# Patient Record
Sex: Female | Born: 1935 | Race: White | Hispanic: No | Smoking: Never smoker
Health system: Southern US, Community
[De-identification: ages and names within clinical notes are randomized; demographics above are authoritative.]

## PROBLEM LIST (undated history)

## (undated) DIAGNOSIS — R569 Unspecified convulsions: Secondary | ICD-10-CM

## (undated) DIAGNOSIS — K219 Gastro-esophageal reflux disease without esophagitis: Secondary | ICD-10-CM

## (undated) DIAGNOSIS — F0390 Unspecified dementia without behavioral disturbance: Secondary | ICD-10-CM

## (undated) DIAGNOSIS — G459 Transient cerebral ischemic attack, unspecified: Secondary | ICD-10-CM

## (undated) DIAGNOSIS — F1010 Alcohol abuse, uncomplicated: Secondary | ICD-10-CM

## (undated) DIAGNOSIS — E876 Hypokalemia: Secondary | ICD-10-CM

## (undated) DIAGNOSIS — E7800 Pure hypercholesterolemia, unspecified: Secondary | ICD-10-CM

## (undated) DIAGNOSIS — C3490 Malignant neoplasm of unspecified part of unspecified bronchus or lung: Secondary | ICD-10-CM

## (undated) DIAGNOSIS — I4891 Unspecified atrial fibrillation: Secondary | ICD-10-CM

## (undated) DIAGNOSIS — F29 Unspecified psychosis not due to a substance or known physiological condition: Secondary | ICD-10-CM

## (undated) DIAGNOSIS — R1310 Dysphagia, unspecified: Secondary | ICD-10-CM

## (undated) DIAGNOSIS — I509 Heart failure, unspecified: Secondary | ICD-10-CM

## (undated) DIAGNOSIS — E079 Disorder of thyroid, unspecified: Secondary | ICD-10-CM

## (undated) DIAGNOSIS — I10 Essential (primary) hypertension: Secondary | ICD-10-CM

## (undated) HISTORY — PX: REPLACEMENT TOTAL KNEE: SUR1224

## (undated) HISTORY — DX: Unspecified psychosis not due to a substance or known physiological condition: F29

## (undated) HISTORY — DX: Alcohol abuse, uncomplicated: F10.10

## (undated) HISTORY — DX: Unspecified convulsions: R56.9

## (undated) HISTORY — PX: LOBECTOMY: SHX5089

## (undated) HISTORY — PX: OTHER SURGICAL HISTORY: SHX169

## (undated) HISTORY — DX: Heart failure, unspecified: I50.9

## (undated) HISTORY — DX: Unspecified dementia without behavioral disturbance: F03.90

---

## 2013-01-14 ENCOUNTER — Emergency Department (HOSPITAL_COMMUNITY): Payer: Medicare (Managed Care)

## 2013-01-14 ENCOUNTER — Emergency Department (HOSPITAL_COMMUNITY)
Admission: EM | Admit: 2013-01-14 | Discharge: 2013-01-14 | Disposition: A | Discharge status: Home / Self Care | Payer: Medicare (Managed Care) | Attending: Emergency Medicine | Admitting: Emergency Medicine

## 2013-01-14 ENCOUNTER — Encounter (HOSPITAL_COMMUNITY): Payer: Self-pay | Admitting: Emergency Medicine

## 2013-01-14 DIAGNOSIS — M79609 Pain in unspecified limb: Secondary | ICD-10-CM

## 2013-01-14 DIAGNOSIS — W010XXA Fall on same level from slipping, tripping and stumbling without subsequent striking against object, initial encounter: Secondary | ICD-10-CM | POA: Diagnosis not present

## 2013-01-14 DIAGNOSIS — Z7982 Long term (current) use of aspirin: Secondary | ICD-10-CM | POA: Diagnosis not present

## 2013-01-14 DIAGNOSIS — Z85118 Personal history of other malignant neoplasm of bronchus and lung: Secondary | ICD-10-CM | POA: Insufficient documentation

## 2013-01-14 DIAGNOSIS — I4891 Unspecified atrial fibrillation: Secondary | ICD-10-CM | POA: Diagnosis not present

## 2013-01-14 DIAGNOSIS — K219 Gastro-esophageal reflux disease without esophagitis: Secondary | ICD-10-CM | POA: Diagnosis not present

## 2013-01-14 DIAGNOSIS — E079 Disorder of thyroid, unspecified: Secondary | ICD-10-CM | POA: Diagnosis not present

## 2013-01-14 DIAGNOSIS — Z8673 Personal history of transient ischemic attack (TIA), and cerebral infarction without residual deficits: Secondary | ICD-10-CM | POA: Diagnosis not present

## 2013-01-14 DIAGNOSIS — Y9229 Other specified public building as the place of occurrence of the external cause: Secondary | ICD-10-CM | POA: Insufficient documentation

## 2013-01-14 DIAGNOSIS — Z79899 Other long term (current) drug therapy: Secondary | ICD-10-CM | POA: Insufficient documentation

## 2013-01-14 DIAGNOSIS — Z7902 Long term (current) use of antithrombotics/antiplatelets: Secondary | ICD-10-CM | POA: Insufficient documentation

## 2013-01-14 DIAGNOSIS — S8253XA Displaced fracture of medial malleolus of unspecified tibia, initial encounter for closed fracture: Secondary | ICD-10-CM | POA: Insufficient documentation

## 2013-01-14 DIAGNOSIS — S8990XA Unspecified injury of unspecified lower leg, initial encounter: Secondary | ICD-10-CM | POA: Diagnosis present

## 2013-01-14 DIAGNOSIS — S8252XA Displaced fracture of medial malleolus of left tibia, initial encounter for closed fracture: Secondary | ICD-10-CM

## 2013-01-14 DIAGNOSIS — Y9389 Activity, other specified: Secondary | ICD-10-CM | POA: Insufficient documentation

## 2013-01-14 DIAGNOSIS — E7800 Pure hypercholesterolemia, unspecified: Secondary | ICD-10-CM | POA: Diagnosis not present

## 2013-01-14 DIAGNOSIS — I10 Essential (primary) hypertension: Secondary | ICD-10-CM | POA: Diagnosis not present

## 2013-01-14 DIAGNOSIS — M7989 Other specified soft tissue disorders: Secondary | ICD-10-CM

## 2013-01-14 HISTORY — DX: Essential (primary) hypertension: I10

## 2013-01-14 HISTORY — DX: Transient cerebral ischemic attack, unspecified: G45.9

## 2013-01-14 HISTORY — DX: Unspecified atrial fibrillation: I48.91

## 2013-01-14 HISTORY — DX: Disorder of thyroid, unspecified: E07.9

## 2013-01-14 HISTORY — DX: Gastro-esophageal reflux disease without esophagitis: K21.9

## 2013-01-14 HISTORY — DX: Malignant neoplasm of unspecified part of unspecified bronchus or lung: C34.90

## 2013-01-14 HISTORY — DX: Pure hypercholesterolemia, unspecified: E78.00

## 2013-01-14 LAB — CBC WITH DIFFERENTIAL/PLATELET
Basophils Absolute: 0 K/uL (ref 0.0–0.1)
Basophils Relative: 1 % (ref 0–1)
Eosinophils Absolute: 0.2 K/uL (ref 0.0–0.7)
Eosinophils Relative: 3 % (ref 0–5)
HCT: 34.9 % — ABNORMAL LOW (ref 36.0–46.0)
Hemoglobin: 12.2 g/dL (ref 12.0–15.0)
Lymphocytes Relative: 23 % (ref 12–46)
Lymphs Abs: 1.4 K/uL (ref 0.7–4.0)
MCH: 33.7 pg (ref 26.0–34.0)
MCHC: 35 g/dL (ref 30.0–36.0)
MCV: 96.4 fL (ref 78.0–100.0)
Monocytes Absolute: 0.8 K/uL (ref 0.1–1.0)
Monocytes Relative: 13 % — ABNORMAL HIGH (ref 3–12)
Neutro Abs: 3.6 K/uL (ref 1.7–7.7)
Neutrophils Relative %: 61 % (ref 43–77)
Platelets: 149 K/uL — ABNORMAL LOW (ref 150–400)
RBC: 3.62 MIL/uL — ABNORMAL LOW (ref 3.87–5.11)
RDW: 14.6 % (ref 11.5–15.5)
WBC: 5.9 K/uL (ref 4.0–10.5)

## 2013-01-14 LAB — BASIC METABOLIC PANEL
BUN: 13 mg/dL (ref 6–23)
CO2: 26 mEq/L (ref 19–32)
Calcium: 9.2 mg/dL (ref 8.4–10.5)
Chloride: 103 mEq/L (ref 96–112)
Creatinine, Ser: 0.7 mg/dL (ref 0.50–1.10)
GFR calc Af Amer: 87 mL/min — ABNORMAL LOW (ref >90)

## 2013-01-14 LAB — D-DIMER, QUANTITATIVE: D-Dimer, Quant: 1.35 ug{FEU}/mL — ABNORMAL HIGH (ref 0.00–0.48)

## 2013-01-14 NOTE — ED Notes (Signed)
Dr Wilkie Aye notified of discharge BP and that pt has not had metoprolol dose today.  No chest pain or dizziness.  Ok per MD to go home and take medication.

## 2013-01-14 NOTE — ED Provider Notes (Signed)
CSN: [id number]     Arrival date & time 01/14/13  1014 History   First MD Initiated Contact with Patient 01/14/13 1019     Chief Complaint  Patient presents with  . Allergic Reaction  . Foot Pain    left   (Consider location/radiation/quality/duration/timing/severity/associated sxs/prior Treatment) HPI  This is an 79 year old female with history of hypertension, hypercholesterolemia, and atrial fibrillation who presents with left foot pain. Patient reports that she was on Levaquin last week. She began to experience left heel pain and discontinued Levaquin use per her doctor's instructions with concerns for Achilles tendinitis or tear. Patient subsequently traveled from [place] to [place] and fell while she was in the airport on Thursday. She states she tripped and fell. She did not hit her head or was consciousness. She reports injury to the left leg. Since that time she's had increasing swelling and pain with ambulation. She denies any history of blood clots. She denies any chest pain or shortness of breath. She denies any other injury.  Past Medical History  Diagnosis Date  . Thyroid disease   . Hypertension   . High cholesterol   . Atrial fibrillation   . TIA (transient ischemic attack)   . GERD (gastroesophageal reflux disease)   . Lung cancer    Past Surgical History  Procedure Laterality Date  . Replacement total knee Bilateral   . Heart stent    . Lobectomy     No family history on file. History  Substance Use Topics  . Smoking status: Never Smoker   . Smokeless tobacco: Not on file  . Alcohol Use: Yes   OB History   Grav Para Term Preterm Abortions TAB SAB Ect Mult Living                 Review of Systems  Constitutional: Negative for fever.  Respiratory: Negative for cough, chest tightness and shortness of breath.   Cardiovascular: Positive for leg swelling. Negative for chest pain.  Gastrointestinal: Negative for nausea, vomiting and abdominal pain.   Genitourinary: Negative for dysuria.  Musculoskeletal: Negative for back pain.       Left leg pain  Skin: Negative for color change, rash and wound.  Psychiatric/Behavioral: Negative for confusion.  All other systems reviewed and are negative.    Allergies  Levofloxacin  Home Medications   Current Outpatient Rx  Name  Route  Sig  Dispense  Refill  . aspirin 325 MG tablet   Oral   Take 325 mg by mouth daily.         Marland Kitchen BIOTIN PO   Oral   Take 120 mg by mouth daily.         . clopidogrel (PLAVIX) 75 MG tablet   Oral   Take 75 mg by mouth daily with breakfast.         . Coenzyme Q10 (COQ-10) 200 MG CAPS   Oral   Take 200 mg by mouth daily.         . digoxin (LANOXIN) 0.125 MG tablet   Oral   Take 0.125 mg by mouth daily.         Marland Kitchen diltiazem (DILACOR XR) 120 MG 24 hr capsule   Oral   Take 120 mg by mouth daily.         . DULoxetine (CYMBALTA) 30 MG capsule   Oral   Take 30 mg by mouth daily.         Marland Kitchen levothyroxine (SYNTHROID, LEVOTHROID) 50  MCG tablet   Oral   Take 50 mcg by mouth daily before breakfast.         . Lutein 40 MG CAPS   Oral   Take 40 mg by mouth daily.         . metoprolol tartrate (LOPRESSOR) 25 MG tablet   Oral   Take 25 mg by mouth 2 (two) times daily.         . Multiple Vitamin (MULTIVITAMIN WITH MINERALS) TABS tablet   Oral   Take 1 tablet by mouth daily.         . pantoprazole (PROTONIX) 40 MG tablet   Oral   Take 40 mg by mouth daily.         . simvastatin (ZOCOR) 80 MG tablet   Oral   Take 80 mg by mouth daily.          BP 174/137  Pulse 93  Temp(Src) 97.4 F (36.3 C) (Oral)  Resp 19  Ht 5' (1.524 m)  Wt 120 lb (54.432 kg)  BMI 23.44 kg/m2  SpO2 96% Physical Exam  Nursing note and vitals reviewed. Constitutional: She is oriented to person, place, and time. No distress.  Elderly  HENT:  Head: Normocephalic and atraumatic.  Mouth/Throat: Oropharynx is clear and moist.  Cardiovascular:  Normal rate, regular rhythm and normal heart sounds.   No murmur heard. Pulmonary/Chest: Effort normal. No respiratory distress.  Abdominal: Soft. There is no tenderness.  Musculoskeletal:  There is 2+ left lower extremity edema to the knee. Tenderness to palpation on the calf. There is also additional swelling around the ankle. Patient in both dorsi and plantar flex without any difficulty. There is no midfoot tenderness.  Neurological: She is alert and oriented to person, place, and time. No cranial nerve deficit.  5 out of 5 strength in all 4 extremities  Skin: Skin is warm and dry.  Psychiatric: She has a normal mood and affect.    ED Course  Procedures (including critical care time) Labs Review Labs Reviewed  CBC WITH DIFFERENTIAL - Abnormal; Notable for the following:    RBC 3.62 (*)    HCT 34.9 (*)    Platelets 149 (*)    Monocytes Relative 13 (*)    All other components within normal limits  BASIC METABOLIC PANEL - Abnormal; Notable for the following:    Glucose, Bld 125 (*)    GFR calc non Af Amer 75 (*)    GFR calc Af Amer 87 (*)    All other components within normal limits  D-DIMER, QUANTITATIVE - Abnormal; Notable for the following:    D-Dimer, Quant 1.35 (*)    All other components within normal limits   Imaging Review Dg Ankle Complete Left  01/14/2013   CLINICAL DATA:  Left ankle swelling after fall.  EXAM: LEFT ANKLE COMPLETE - 3+ VIEW  COMPARISON:  None.  FINDINGS: Small bone fragment is seen just distal to medial malleolus of distal tibia consistent with a minimally displaced fracture of indeterminate age. Joint spaces are intact. No soft tissue swelling or other abnormality is noted.  IMPRESSION: Small bone fragment is seen arising from medial malleolus consistent with minimally displaced fracture of indeterminate age.   Electronically Signed   By: Roque Lias M.D.   On: 01/14/2013 11:43   Dg Foot Complete Left  01/14/2013   CLINICAL DATA:  Left foot pain  after fall.  EXAM: LEFT FOOT - COMPLETE 3+ VIEW  COMPARISON:  None.  FINDINGS: There is no evidence of fracture or dislocation. There is no evidence of arthropathy or other focal bone abnormality. Soft tissues are unremarkable.  IMPRESSION: Normal left foot.   Electronically Signed   By: Roque Lias M.D.   On: 01/14/2013 11:41    EKG Interpretation   None       MDM   1. Fractured medial malleolus, left, closed, initial encounter    Patient presents with pain and swelling to the left foot and calf.  Otherwise, nontoxic. Differential includes acute injury (sprain vs fracture), DVT.  Patient does not have any evidence of achilles tendon disfunction and have low suspicion for achilles damage.  D-dimer positive and given recent travel will send for [place].  Xrays notable for small medial malleolus fracture of unknown age.  [place] neg. Will treat fracture as acute.  Patient placed in [facility] Walker.  Patient to follow-up with PCP in [facility] when she returns in 1 week for ortho referral.  After history, exam, and medical workup I feel the patient has been appropriately medically screened and is safe for discharge home. Pertinent diagnoses were discussed with the patient. Patient was given return precautions.     Shon Baton, MD 01/15/13 [phone]

## 2013-01-14 NOTE — ED Notes (Signed)
Patient transported to X-ray 

## 2013-01-14 NOTE — Progress Notes (Signed)
Orthopedic Tech Progress Note Patient Details:  Casey Wood 1922-10-17 [id number]  Ortho Devices Type of Ortho Device: CAM walker Ortho Device/Splint Location: lle Ortho Device/Splint Interventions: Application   [name] 01/14/2013, 1:41 PM

## 2013-01-14 NOTE — Progress Notes (Signed)
VASCULAR LAB PRELIMINARY  PRELIMINARY  PRELIMINARY  PRELIMINARY  Left lower extremity venous Doppler completed.    Preliminary report:  There is no DVT or SVT noted in the left lower extremity.  Andres Vest, RVT 01/14/2013, 12:30 PM

## 2013-01-14 NOTE — ED Notes (Signed)
Pt reports that she thinks she has tendonitis in left ankle from taking antibiotic levofloxacin. Pt also reports that she tripped and fell on Thursday while at the airport traveling from [place] to here. Pt has swelling, bruise and redness to left foot, ankle and calf.

## 2018-02-06 ENCOUNTER — Inpatient Hospital Stay
Admission: RE | Admit: 2018-02-06 | Discharge: 2018-10-03 | Disposition: A | Discharge status: Skilled Nursing Facility | Payer: Medicare (Managed Care) | Source: Ambulatory Visit | Attending: Internal Medicine | Admitting: Internal Medicine

## 2018-02-06 DIAGNOSIS — R609 Edema, unspecified: Secondary | ICD-10-CM

## 2018-02-06 DIAGNOSIS — R52 Pain, unspecified: Principal | ICD-10-CM

## 2018-02-07 ENCOUNTER — Encounter: Payer: Self-pay | Admitting: Internal Medicine

## 2018-02-07 ENCOUNTER — Non-Acute Institutional Stay (SKILLED_NURSING_FACILITY): Payer: Medicare HMO | Admitting: Internal Medicine

## 2018-02-07 DIAGNOSIS — I509 Heart failure, unspecified: Secondary | ICD-10-CM

## 2018-02-07 DIAGNOSIS — F03C0 Unspecified dementia, severe, without behavioral disturbance, psychotic disturbance, mood disturbance, and anxiety: Secondary | ICD-10-CM | POA: Insufficient documentation

## 2018-02-07 DIAGNOSIS — I4891 Unspecified atrial fibrillation: Secondary | ICD-10-CM | POA: Insufficient documentation

## 2018-02-07 DIAGNOSIS — F419 Anxiety disorder, unspecified: Secondary | ICD-10-CM

## 2018-02-07 DIAGNOSIS — F0390 Unspecified dementia without behavioral disturbance: Secondary | ICD-10-CM

## 2018-02-07 DIAGNOSIS — F1010 Alcohol abuse, uncomplicated: Secondary | ICD-10-CM

## 2018-02-07 DIAGNOSIS — R569 Unspecified convulsions: Secondary | ICD-10-CM

## 2018-02-07 DIAGNOSIS — F0391 Unspecified dementia with behavioral disturbance: Secondary | ICD-10-CM | POA: Diagnosis not present

## 2018-02-07 HISTORY — DX: Heart failure, unspecified: I50.9

## 2018-02-07 HISTORY — DX: Unspecified dementia, unspecified severity, without behavioral disturbance, psychotic disturbance, mood disturbance, and anxiety: F03.90

## 2018-02-07 HISTORY — DX: Alcohol abuse, uncomplicated: F10.10

## 2018-02-07 HISTORY — DX: Unspecified convulsions: R56.9

## 2018-02-07 NOTE — Progress Notes (Signed)
Provider:  Veleta Miners MD Location:    [place] Room Number: 140/D Place of Service:  SNF (31)  PCP: System, Pcp Not In Patient Care Team: System, Pcp Not In as PCP - General  Extended Emergency Contact Information Primary Emergency Contact: Adrian Blackwater of [place] Phone: [phone] Mobile Phone: [phone] Relation: Son  Code Status: Full Code Goals of Care: Advanced Directive information Advanced Directives 02/07/2018  Does Patient Have a Medical Advance Directive? Yes  Type of Advance Directive (No Data)  Does patient want to make changes to medical advance directive? No - Patient declined      Chief Complaint  Patient presents with  . New Admit To SNF    New Admission Visit    HPI: Patient is a 84 y.o. female seen today for admission to SNF for therapy and Long term Care Patient was transferred from another facility in [place]. Most of the history was obtained from the Daughter and records She has h/o Atrial Fibrillation, GERD, Dementia, Seizures,Hypertension, Hypothyroid, CHF, and Hyperlipidemia  Patient lived by herself in [place] in [place] . She had h/o ETOH abuse. She was found on the floor with Humeral fracture in her house.She also had seizure. She was intubated in the hospital. And eventually  was transferred to SNF in new [place] for therapy. But patient continued to refuse treatment , Rehab and her Meds in that facility. She lost almost 30 lbs per her daughter. Eventually all her meds were stopped and she was started on Xanax PRN Her daughter transferred her mom to SNF here for Long term care as she lives in this area. Patient refused meds, Food this morning. She fell when trying to go to [facility] without asking for help. She refused to eat and also resisting care by Nurses. She also told me to go away and would not let me examine her.  Past Medical History:  Diagnosis Date  . Atrial fibrillation ([place])     . GERD (gastroesophageal reflux disease)   . High cholesterol   . Hypertension   . Lung cancer ([place])   . Thyroid disease   . TIA (transient ischemic attack)    Past Surgical History:  Procedure Laterality Date  . heart stent    . LOBECTOMY    . REPLACEMENT TOTAL KNEE Bilateral     reports that she has never smoked. She has never used smokeless tobacco. She reports previous alcohol use. She reports that she does not use drugs. Social History   Socioeconomic History  . Marital status: Widowed    Spouse name: Not on file  . Number of children: Not on file  . Years of education: Not on file  . Highest education level: Not on file  Occupational History  . Not on file  Social Needs  . Financial resource strain: Not on file  . Food insecurity:    Worry: Not on file    Inability: Not on file  . Transportation needs:    Medical: Not on file    Non-medical: Not on file  Tobacco Use  . Smoking status: Never Smoker  . Smokeless tobacco: Never Used  Substance and Sexual Activity  . Alcohol use: Not Currently  . Drug use: No  . Sexual activity: Not Currently  Lifestyle  . Physical activity:    Days per week: Not on file    Minutes per session: Not on file  . Stress: Not on file  Relationships  .  Social connections:    Talks on phone: Not on file    Gets together: Not on file    Attends religious service: Not on file    Active member of club or organization: Not on file    Attends meetings of clubs or organizations: Not on file    Relationship status: Not on file  . Intimate partner violence:    Fear of current or ex partner: Not on file    Emotionally abused: Not on file    Physically abused: Not on file    Forced sexual activity: Not on file  Other Topics Concern  . Not on file  Social History Narrative  . Not on file    Functional Status Survey:    History reviewed. No pertinent family history.  There are no preventive care reminders to display for this  patient.  Allergies  Allergen Reactions  . Levofloxacin     Allergies as of 02/07/2018      Reactions   Levofloxacin       Medication List       Accurate as of February 07, 2018 10:08 AM. Always use your most recent med list.        amiodarone 200 MG tablet Commonly known as:  PACERONE Take 200 mg by mouth daily.   aspirin 81 MG chewable tablet Chew 81 mg by mouth daily.   diltiazem 360 MG 24 hr capsule Commonly known as:  CARDIZEM CD Take 360 mg by mouth daily.   donepezil 5 MG tablet Commonly known as:  ARICEPT Take 5 mg by mouth at bedtime.   famotidine 20 MG tablet Commonly known as:  PEPCID Take 20 mg by mouth daily.   folic acid 1 MG tablet Commonly known as:  FOLVITE Take 1 mg by mouth daily.   furosemide 20 MG tablet Commonly known as:  LASIX Take 20 mg by mouth daily.   ipratropium-albuterol 0.5-2.5 (3) MG/3ML Soln Commonly known as:  DUONEB Take 3 mLs by nebulization every 6 (six) hours.   levETIRAcetam 1000 MG tablet Commonly known as:  KEPPRA Take 1,000 mg by mouth 2 (two) times daily.   levothyroxine 50 MCG tablet Commonly known as:  SYNTHROID, LEVOTHROID Take 50 mcg by mouth daily before breakfast.   metoprolol tartrate 25 MG tablet Commonly known as:  LOPRESSOR Take 25 mg by mouth 2 (two) times daily.   multivitamin with minerals Tabs tablet Take 1 tablet by mouth daily.   potassium chloride SA 20 MEQ tablet Commonly known as:  K-DUR,KLOR-CON Take 40 mEq by mouth daily.   thiamine 100 MG tablet Take 100 mg by mouth daily.   vitamin C 500 MG tablet Commonly known as:  ASCORBIC ACID Take 500 mg by mouth daily.       Review of Systems  Reason unable to perform ROS: Patient refused to answer.    Vitals:   02/07/18 1006  BP: 105/82  Pulse: 82  Temp: 97.8 F (36.6 C)  TempSrc: Oral   There is no height or weight on file to calculate BMI. Physical Exam HENT:     Head: Normocephalic.     Mouth/Throat:     Comments:  Poor Oral Hygeine Eyes:     Pupils: Pupils are equal, round, and reactive to light.  Cardiovascular:     Pulses: Normal pulses.     Heart sounds: Normal heart sounds.  Pulmonary:     Effort: Pulmonary effort is normal.     Breath sounds: Normal  breath sounds. No wheezing or rales.  Abdominal:     General: Abdomen is flat. Bowel sounds are normal. There is no distension.     Palpations: Abdomen is soft.     Tenderness: There is no abdominal tenderness. There is no guarding.  Musculoskeletal:        General: No swelling.  Skin:    General: Skin is warm and dry.     Comments: Has Pressure Wound on her Heels Stage 1  Neurological:     Mental Status: She is alert.     Comments: Was unable to assess as she would not let me Examine her. But seems to be moving All extremeties  Psychiatric:     Comments: Not Assessed Patient refusing care     Labs reviewed: Basic Metabolic Panel: No results for input(s): NA, K, CL, CO2, GLUCOSE, BUN, CREATININE, CALCIUM, MG, PHOS in the last 8760 hours. Liver Function Tests: No results for input(s): AST, ALT, ALKPHOS, BILITOT, PROT, ALBUMIN in the last 8760 hours. No results for input(s): LIPASE, AMYLASE in the last 8760 hours. No results for input(s): AMMONIA in the last 8760 hours. CBC: No results for input(s): WBC, NEUTROABS, HGB, HCT, MCV, PLT in the last 8760 hours. Cardiac Enzymes: No results for input(s): CKTOTAL, CKMB, CKMBINDEX, TROPONINI in the last 8760 hours. BNP: Invalid input(s): POCBNP No results found for: HGBA1C No results found for: TSH No results found for: VITAMINB12 No results found for: FOLATE No results found for: IRON, TIBC, FERRITIN  Imaging and Procedures obtained prior to SNF admission: Dg Ankle Complete Left  Result Date: 01/14/2013 CLINICAL DATA:  Left ankle swelling after fall. EXAM: LEFT ANKLE COMPLETE - 3+ VIEW COMPARISON:  None. FINDINGS: Small bone fragment is seen just distal to medial malleolus of distal  tibia consistent with a minimally displaced fracture of indeterminate age. Joint spaces are intact. No soft tissue swelling or other abnormality is noted. IMPRESSION: Small bone fragment is seen arising from medial malleolus consistent with minimally displaced fracture of indeterminate age. Electronically Signed   By: Sabino Dick M.D.   On: 01/14/2013 11:43   Dg Foot Complete Left  Result Date: 01/14/2013 CLINICAL DATA:  Left foot pain after fall. EXAM: LEFT FOOT - COMPLETE 3+ VIEW COMPARISON:  None. FINDINGS: There is no evidence of fracture or dislocation. There is no evidence of arthropathy or other focal bone abnormality. Soft tissues are unremarkable. IMPRESSION: Normal left foot. Electronically Signed   By: Sabino Dick M.D.   On: 01/14/2013 11:41    Assessment/Plan Patient with h/o Atrial Fibrillation, Dementia, Anxiety, Seizure disorder, Now refusing care D/W the daughter Patient is Comfort care All her Meds are stopped. No Blood work for now. Will start her on Low dose of xanax. She has been on this in other facility. Also will start her on Buspar if she takes it. Will continue Fall precautions. Therapy to evaluate if she can use Walker. She was using it in other facility.    Family/ staff Communication:   Labs/tests ordered: Total time spent in this patient care encounter was 45_ minutes; greater than 50% of the visit spent counseling patient, reviewing records , Labs and coordinating care for problems addressed at this encounter.

## 2018-02-08 ENCOUNTER — Other Ambulatory Visit: Payer: Self-pay

## 2018-02-08 MED ORDER — ALPRAZOLAM 0.25 MG PO TABS: 0.2500 mg | ORAL_TABLET | Freq: Three times a day (TID) | ORAL | 2 refills | Status: DC | PRN

## 2018-02-08 NOTE — Telephone Encounter (Signed)
RX Fax for [facility]@ [phone]  

## 2018-03-15 ENCOUNTER — Non-Acute Institutional Stay (SKILLED_NURSING_FACILITY): Payer: Medicare HMO | Admitting: Internal Medicine

## 2018-03-15 ENCOUNTER — Encounter: Payer: Self-pay | Admitting: Internal Medicine

## 2018-03-15 DIAGNOSIS — R569 Unspecified convulsions: Secondary | ICD-10-CM

## 2018-03-15 DIAGNOSIS — I4891 Unspecified atrial fibrillation: Secondary | ICD-10-CM

## 2018-03-15 DIAGNOSIS — F419 Anxiety disorder, unspecified: Secondary | ICD-10-CM

## 2018-03-15 DIAGNOSIS — F0391 Unspecified dementia with behavioral disturbance: Secondary | ICD-10-CM | POA: Diagnosis not present

## 2018-03-15 DIAGNOSIS — I509 Heart failure, unspecified: Secondary | ICD-10-CM

## 2018-03-15 NOTE — Progress Notes (Signed)
Location:    [facility] Room Number: 140/D Place of Service:  SNF (31) Provider:  [place] [facility]  System, Pcp Not In  Patient Care Team: System, Pcp Not In as PCP - General  Extended Emergency Contact Information Primary Emergency Contact: KELLY,WENDY Address: [address], [place] [address] [place] [employer] [place] Phone: [phone] Relation: Daughter Secondary Emergency Contact: Quentin Ore Address: [address] [place] [address] [place] [employer] Phone: [phone] Relation: Daughter  Code Status:  DNR Goals of care: Advanced Directive information Advanced Directives 03/15/2018  Does Patient Have a Medical Advance Directive? Yes  Type of Advance Directive Out of facility DNR (pink MOST or yellow form)  Does patient want to make changes to medical advance directive? No - Patient declined     Chief complaint acute visit follow-up dementia with behaviors- also follow-up of chronic medical conditions including A. fib seizure disorder hypertension- hypothyroidism  HPI:  Pt is a 85 y.o. female seen today for an acute visit for follow-up of dementia with behaviors most prominently anxiety.  Patient was admitted the facility about a month ago for long-term care- was transferred from another facility in [place] state.  She has a history of atrial fibrillation as well as GERD dementia seizure disorder hypertension hypothyroidism CHF and hyperlipidemia.  Apparently she lives by herself in [place]- she has a history of alcohol abuse and was found on the floor with a humeral fracture in her house.  She also had a seizure- she actually required intubation in the hospital and eventually was transferred to a skilled nursing facility  She was supposed to receive therapy but apparently refused treatment- as well as her medications and that facility and lost about 30 pounds.  Eventually all  her meds were stopped and she was started on Xanax as needed.  She was transferred here to be closer to family.  When Dr. Lyndel Safe saw her about a month ago she continued to refuse meds and actually food at times. She also was resistant to care by nurses-.  Dr. Lyndel Safe did discuss this with her daughter and patient was made comfort care and her medications were stopped.  She was started on BuSpar 5 mg twice daily- and PRN Xanax -- also on low-dose Seroquel 12.5 mg twice daily  She is now apparently eating better she is actually gained it appears about 5 pounds since her admission- she is more cooperative with staff and did let me examine her today.  Apparently she continues to have significant anxiety especially in the afternoon- her daughter would like her to have Xanax routinely to see if this will have a moderating effect.  Apparently she has tolerated the Xanax well and again appears to be doing relatively well otherwise.  Regards to her other medical issues she is no longer on medications for them but nonetheless appears to be stable.  In regards atrial fibrillation rate appears to be controlled.  There is been no reported seizures during her stay here she had been on Keppra previously.  Blood pressure also appears to be stable from what I been able to obtain blood pressure is stable today.  Regards to CHF she does not really have significant edema I would say this is minimal continues to ambulate about facility in a  confused manner but does not complain of shortness of breath or chest pain.  I suspect her weight gain is largely appetite related.  She also has a history of hypothyroidism but is no longer on Synthroid  Currently she is cooperative with exam confused but not agitated vital signs appear to be stable    Past Medical History:  Diagnosis Date  . Atrial fibrillation ([place])   . GERD (gastroesophageal reflux disease)   . High cholesterol   . Hypertension   . Lung  cancer ([place])   . Thyroid disease   . TIA (transient ischemic attack)    Past Surgical History:  Procedure Laterality Date  . heart stent    . LOBECTOMY    . REPLACEMENT TOTAL KNEE Bilateral     Allergies  Allergen Reactions  . Levofloxacin     Outpatient Encounter Medications as of 03/15/2018  Medication Sig  . ALPRAZolam (XANAX) 0.25 MG tablet Take 1 tablet (0.25 mg total) by mouth 3 (three) times daily as needed for anxiety.  . busPIRone (BUSPAR) 5 MG tablet Take 5 mg by mouth 2 (two) times daily.  . QUEtiapine (SEROQUEL) 25 MG tablet Take 12.5 mg by mouth 2 (two) times daily.  . [DISCONTINUED] amiodarone (PACERONE) 200 MG tablet Take 200 mg by mouth daily.  . [DISCONTINUED] aspirin 81 MG chewable tablet Chew 81 mg by mouth daily.  . [DISCONTINUED] diltiazem (CARDIZEM CD) 360 MG 24 hr capsule Take 360 mg by mouth daily.  . [DISCONTINUED] donepezil (ARICEPT) 5 MG tablet Take 5 mg by mouth at bedtime.  . [DISCONTINUED] famotidine (PEPCID) 20 MG tablet Take 20 mg by mouth daily.  . [DISCONTINUED] folic acid (FOLVITE) 1 MG tablet Take 1 mg by mouth daily.  . [DISCONTINUED] furosemide (LASIX) 20 MG tablet Take 20 mg by mouth daily.  . [DISCONTINUED] ipratropium-albuterol (DUONEB) 0.5-2.5 (3) MG/3ML SOLN Take 3 mLs by nebulization every 6 (six) hours.  . [DISCONTINUED] levETIRAcetam (KEPPRA) 1000 MG tablet Take 1,000 mg by mouth 2 (two) times daily.  . [DISCONTINUED] levothyroxine (SYNTHROID, LEVOTHROID) 50 MCG tablet Take 50 mcg by mouth daily before breakfast.  . [DISCONTINUED] metoprolol tartrate (LOPRESSOR) 25 MG tablet Take 25 mg by mouth 2 (two) times daily.  . [DISCONTINUED] Multiple Vitamin (MULTIVITAMIN WITH MINERALS) TABS tablet Take 1 tablet by mouth daily.  . [DISCONTINUED] potassium chloride SA (K-DUR,KLOR-CON) 20 MEQ tablet Take 40 mEq by mouth daily.  . [DISCONTINUED] thiamine 100 MG tablet Take 100 mg by mouth daily.  . [DISCONTINUED] vitamin C (ASCORBIC ACID) 500 MG  tablet Take 500 mg by mouth daily.   No facility-administered encounter medications on file as of 03/15/2018.     Review of Systems   Largely unobtainable secondary to dementia please see HPI according to staff she is eating better she is more cooperative but does have a lot of anxiety especially later in the day   There is no immunization history on file for this patient. Pertinent  Health Maintenance Due  Topic Date Due  . INFLUENZA VACCINE  04/15/2018 (Originally 09/22/2017)  . DEXA SCAN  04/15/2018 (Originally 09/06/1989)  . PNA vac Low Risk Adult (1 of 2 - PCV13) 04/15/2018 (Originally 09/06/1989)   No flowsheet data found. Functional Status Survey:    Vitals:   03/15/18 1150  BP: 120/60  Pulse: 66  Resp: 18  Temp: (!) 97.5 F (36.4 C)  TempSrc: Oral  SpO2: 97%  Weight is 99.4 pounds appears to be a gain of 5 pounds since  her admission Physical Exam   In general this is a pleasant confused elderly female in no distress she is bright alert very talkative.  Her skin is warm and dry she does have numerous solar induced scaling diffuse with venous stasis changes lower extremities With scaling.  Eyes sclera and conjunctive are clear visual acuity appears to be intact.  Oropharynx is clear mucous membranes moist.  Chest is clear to auscultation with shallow air entry there is no labored breathing.  Heart is regular irregular rate and rhythm I got auscultation in the 80s she has minimal lower extremity edema  Abdomen is soft nontender with positive bowel sounds.   Musculoskeletal  is able to move all extremities x4 at baseline she does ambulate quite well in her wheelchair  Neurologic is grossly intact her speech is clear although somewhat nonsensical.  Psych she is oriented to self she does follow simple verbal commands at times-very talkative confused but not unpleasant.   Labs reviewed: No results for input(s): NA, K, CL, CO2, GLUCOSE, BUN, CREATININE, CALCIUM,  MG, PHOS in the last 8760 hours. No results for input(s): AST, ALT, ALKPHOS, BILITOT, PROT, ALBUMIN in the last 8760 hours. No results for input(s): WBC, NEUTROABS, HGB, HCT, MCV, PLT in the last 8760 hours. No results found for: TSH No results found for: HGBA1C No results found for: CHOL, HDL, LDLCALC, LDLDIRECT, TRIG, CHOLHDL  Significant Diagnostic Results in last 30 days:  No results found.  Assessment/Plan  #1 dementia with behaviors most significantly anxiety-this appears to be better controlled-however she still has significant anxiety especially later in the day her daughter feels she do better with routine Xanax and will give this a trial course will make Xanax routine twice daily at current dose 0.25 mg and continue 1 as needed dose a day as well.  She also is on BuSpar 5 mg twice daily-in addition to Seroquel 12.5 mg twice daily which appears to be tolerated well.  In regards to atrial fibrillation as well as seizure disorder CHF and hypothyroidism- again her medications have been discontinued secondary to comfort care status-nonetheless she appears to be doing well--ate appears to be controlled on exam today--there has been no reported seizures to my knowledge her edema is quite minimal I suspect her weight gain is largely appetite related at this point will continue to monitor with emphasis on comfort    [phone]

## 2018-03-17 ENCOUNTER — Encounter (HOSPITAL_COMMUNITY)
Admission: RE | Admit: 2018-03-17 | Discharge: 2018-03-17 | Disposition: A | Discharge status: Home / Self Care | Payer: Medicare HMO | Source: Skilled Nursing Facility | Attending: Internal Medicine | Admitting: Internal Medicine

## 2018-03-17 ENCOUNTER — Other Ambulatory Visit: Payer: Self-pay

## 2018-03-17 DIAGNOSIS — R279 Unspecified lack of coordination: Secondary | ICD-10-CM | POA: Insufficient documentation

## 2018-03-17 DIAGNOSIS — R600 Localized edema: Secondary | ICD-10-CM | POA: Insufficient documentation

## 2018-03-17 DIAGNOSIS — E785 Hyperlipidemia, unspecified: Secondary | ICD-10-CM | POA: Diagnosis not present

## 2018-03-17 DIAGNOSIS — R062 Wheezing: Secondary | ICD-10-CM | POA: Insufficient documentation

## 2018-03-17 DIAGNOSIS — I2510 Atherosclerotic heart disease of native coronary artery without angina pectoris: Secondary | ICD-10-CM | POA: Insufficient documentation

## 2018-03-17 DIAGNOSIS — R569 Unspecified convulsions: Secondary | ICD-10-CM | POA: Insufficient documentation

## 2018-03-17 DIAGNOSIS — E876 Hypokalemia: Secondary | ICD-10-CM | POA: Diagnosis present

## 2018-03-17 DIAGNOSIS — M6281 Muscle weakness (generalized): Secondary | ICD-10-CM | POA: Diagnosis not present

## 2018-03-17 DIAGNOSIS — Z9181 History of falling: Secondary | ICD-10-CM | POA: Insufficient documentation

## 2018-03-17 LAB — CBC WITH DIFFERENTIAL/PLATELET
ABS IMMATURE GRANULOCYTES: 0.12 10*3/uL — AB (ref 0.00–0.07)
BASOS PCT: 1 %
Basophils Absolute: 0 10*3/uL (ref 0.0–0.1)
Eosinophils Absolute: 0.1 10*3/uL (ref 0.0–0.5)
Eosinophils Relative: 2 %
HCT: 38.8 % (ref 36.0–46.0)
Hemoglobin: 12.4 g/dL (ref 12.0–15.0)
Immature Granulocytes: 2 %
Lymphocytes Relative: 20 %
Lymphs Abs: 1.1 10*3/uL (ref 0.7–4.0)
MCH: 34.5 pg — ABNORMAL HIGH (ref 26.0–34.0)
MCHC: 32 g/dL (ref 30.0–36.0)
MCV: 108.1 fL — ABNORMAL HIGH (ref 80.0–100.0)
MONOS PCT: 9 %
Monocytes Absolute: 0.5 10*3/uL (ref 0.1–1.0)
NEUTROS PCT: 66 %
Neutro Abs: 3.8 10*3/uL (ref 1.7–7.7)
Platelets: 224 10*3/uL (ref 150–400)
RBC: 3.59 MIL/uL — ABNORMAL LOW (ref 3.87–5.11)
RDW: 15.4 % (ref 11.5–15.5)
WBC: 5.7 10*3/uL (ref 4.0–10.5)
nRBC: 0 % (ref 0.0–0.2)

## 2018-03-17 LAB — COMPREHENSIVE METABOLIC PANEL
ALBUMIN: 3.1 g/dL — AB (ref 3.5–5.0)
ALT: 14 U/L (ref 0–44)
ANION GAP: 6 (ref 5–15)
AST: 20 U/L (ref 15–41)
Alkaline Phosphatase: 81 U/L (ref 38–126)
BUN: 13 mg/dL (ref 8–23)
CO2: 26 mmol/L (ref 22–32)
Calcium: 8.7 mg/dL — ABNORMAL LOW (ref 8.9–10.3)
Chloride: 106 mmol/L (ref 98–111)
Creatinine, Ser: 0.91 mg/dL (ref 0.44–1.00)
GFR calc non Af Amer: 54 mL/min — ABNORMAL LOW (ref >60)
GLUCOSE: 85 mg/dL (ref 70–99)
POTASSIUM: 3.3 mmol/L — AB (ref 3.5–5.1)
Sodium: 138 mmol/L (ref 135–145)
Total Bilirubin: 0.9 mg/dL (ref 0.3–1.2)
Total Protein: 5.7 g/dL — ABNORMAL LOW (ref 6.5–8.1)

## 2018-03-17 LAB — HEMOGLOBIN A1C
Hgb A1c MFr Bld: 4.8 % (ref 4.8–5.6)
MEAN PLASMA GLUCOSE: 91.06 mg/dL

## 2018-03-17 MED ORDER — ALPRAZOLAM 0.25 MG PO TABS: 0.2500 mg | ORAL_TABLET | Freq: Three times a day (TID) | ORAL | 0 refills | Status: DC | PRN

## 2018-03-17 NOTE — Telephone Encounter (Signed)
RX Fax for [facility]@ [phone]  

## 2018-03-20 ENCOUNTER — Encounter (HOSPITAL_COMMUNITY)
Admission: RE | Admit: 2018-03-20 | Discharge: 2018-03-20 | Disposition: A | Discharge status: Home / Self Care | Payer: Medicare HMO | Source: Skilled Nursing Facility

## 2018-03-20 DIAGNOSIS — E876 Hypokalemia: Secondary | ICD-10-CM | POA: Diagnosis not present

## 2018-03-20 LAB — BASIC METABOLIC PANEL
Anion gap: 7 (ref 5–15)
BUN: 14 mg/dL (ref 8–23)
CO2: 27 mmol/L (ref 22–32)
Calcium: 9.2 mg/dL (ref 8.9–10.3)
Chloride: 103 mmol/L (ref 98–111)
Creatinine, Ser: 0.81 mg/dL (ref 0.44–1.00)
GFR calc Af Amer: >60 mL/min (ref >60)
GFR calc non Af Amer: >60 mL/min (ref >60)
Glucose, Bld: 74 mg/dL (ref 70–99)
Potassium: 3.5 mmol/L (ref 3.5–5.1)
SODIUM: 137 mmol/L (ref 135–145)

## 2018-04-03 ENCOUNTER — Non-Acute Institutional Stay (SKILLED_NURSING_FACILITY): Payer: Medicare HMO | Admitting: Internal Medicine

## 2018-04-03 DIAGNOSIS — F28 Other psychotic disorder not due to a substance or known physiological condition: Secondary | ICD-10-CM

## 2018-04-03 DIAGNOSIS — F419 Anxiety disorder, unspecified: Secondary | ICD-10-CM | POA: Diagnosis not present

## 2018-04-03 DIAGNOSIS — F0391 Unspecified dementia with behavioral disturbance: Secondary | ICD-10-CM

## 2018-04-06 ENCOUNTER — Encounter: Payer: Self-pay | Admitting: Internal Medicine

## 2018-04-06 NOTE — Progress Notes (Signed)
Location:    [facility] Room Number: 140/W Place of Service:  SNF (31) Provider:  Veleta Miners MD  System, Pcp Not In  Patient Care Team: System, Pcp Not In as PCP - General  Extended Emergency Contact Information Primary Emergency Contact: KELLY,WENDY Address: [address], [place] [address] [place] [employer] Phone: [phone] Relation: Daughter Secondary Emergency Contact: Quentin Ore Address: [address] [place] [address] [place] [employer] Phone: [phone] Relation: Daughter  Code Status:  DNR Goals of care: Advanced Directive information Advanced Directives 04/06/2018  Does Patient Have a Medical Advance Directive? Yes  Type of Advance Directive Out of facility DNR (pink MOST or yellow form)  Does patient want to make changes to medical advance directive? No - Patient declined     Chief Complaint  Patient presents with  . Acute Visit    Medication Management    HPI:  Pt is a 85 y.o. female seen today for an acute visit for Management of her  Anti anxiety  Meds. Patient was transferred from another facility in [facility]. Most of the history was obtained from the Daughter and records She has h/o Atrial Fibrillation, GERD, Dementia, Seizures,Hypertension, Hypothyroid, CHF, and Hyperlipidemia  Patient lived by herself in [place] in [place] . She had h/o ETOH abuse. She was found on the floor with Humeral fracture in her house.She also had seizure. She was intubated in the hospital. And eventually  was transferred to SNF in [place] for therapy. But patient continued to refuse treatment , Rehab and her Meds in that facility. She lost almost 30 lbs per her daughter. Eventually all her meds were stopped and she was started on Xanax PRN Her daughter transferred her mom to SNF here for Long term care as she lives in this area. Has adjusted well to the facility.  She is  on BuSpar, Seroquel and Xanax Still is very stays very confused.  But is eating good is comfortable with the staff.  She usually stays in a wheelchair and gets around.  Past Medical History:  Diagnosis Date  . Atrial fibrillation ([facility])   . GERD (gastroesophageal reflux disease)   . High cholesterol   . Hypertension   . Lung cancer ([place])   . Thyroid disease   . TIA (transient ischemic attack)    Past Surgical History:  Procedure Laterality Date  . heart stent    . LOBECTOMY    . REPLACEMENT TOTAL KNEE Bilateral     Allergies  Allergen Reactions  . Levofloxacin     DNR  Review of Systems  Unable to perform ROS: Dementia     There is no immunization history on file for this patient. Pertinent  Health Maintenance Due  Topic Date Due  . INFLUENZA VACCINE  04/15/2018 (Originally 09/22/2017)  . DEXA SCAN  04/15/2018 (Originally 09/06/1989)  . PNA vac Low Risk Adult (1 of 2 - PCV13) 04/15/2018 (Originally 09/06/1989)   No flowsheet data found. Functional Status Survey:    Vitals:   04/03/18 1044  Weight: 99 lb 6.4 oz (45.1 kg)  Height: 5\' 2"  (1.575 m)   Body mass index is 18.18 kg/m. Physical Exam Vitals signs reviewed.  Constitutional:      Appearance: Normal appearance.  HENT:  Head: Normocephalic.     Nose: Nose normal.     Mouth/Throat:     Mouth: Mucous membranes are moist.     Pharynx: Oropharynx is clear.  Eyes:     Pupils: Pupils are equal, round, and reactive to light.  Neck:     Musculoskeletal: Neck supple.  Cardiovascular:     Rate and Rhythm: Normal rate and regular rhythm.     Pulses: Normal pulses.     Heart sounds: No murmur.  Pulmonary:     Effort: Pulmonary effort is normal.     Breath sounds: Normal breath sounds.  Abdominal:     General: Abdomen is flat. Bowel sounds are normal.     Palpations: Abdomen is soft.  Musculoskeletal:        General: No swelling.  Skin:    General: Skin is warm and dry.  Neurological:     General: No  focal deficit present.     Mental Status: She is alert.     Comments: Is not oriented. And stays Confused Usually stays in a wheelchair.  Psychiatric:        Mood and Affect: Mood normal.        Thought Content: Thought content normal.     Labs reviewed: Recent Labs    03/17/18 0759 03/20/18 1157  NA 138 137  K 3.3* 3.5  CL 106 103  CO2 26 27  GLUCOSE 85 74  BUN 13 14  CREATININE 0.91 0.81  CALCIUM 8.7* 9.2   Recent Labs    03/17/18 0759  AST 20  ALT 14  ALKPHOS 81  BILITOT 0.9  PROT 5.7*  ALBUMIN 3.1*   Recent Labs    03/17/18 0759  WBC 5.7  NEUTROABS 3.8  HGB 12.4  HCT 38.8  MCV 108.1*  PLT 224   No results found for: TSH Lab Results  Component Value Date   HGBA1C 4.8 03/17/2018   No results found for: CHOL, HDL, LDLCALC, LDLDIRECT, TRIG, CHOLHDL  Significant Diagnostic Results in last 30 days:  No results found.  Assessment/Plan Patient has a history of A. fib, seizures  As per her daughter Request we had not started  her medications including Cardizem and amiodarone and Keppra Dementia with psychosis Since patient has stabilized a little bit in the facility We will try to slowly taper her off Xanax Continue on BuSpar and Seroquel for now ACP Patient continues to be comfort care per her POA  Family/ staff Communication:   Labs/tests ordered:   Total time spent in this patient care encounter was 25_ minutes; greater than 50% of the visit spent counseling patient, reviewing records , Labs and coordinating care for problems addressed at this encounter.

## 2018-04-08 ENCOUNTER — Encounter: Payer: Self-pay | Admitting: Internal Medicine

## 2018-04-08 DIAGNOSIS — F0391 Unspecified dementia with behavioral disturbance: Secondary | ICD-10-CM | POA: Insufficient documentation

## 2018-04-08 DIAGNOSIS — F29 Unspecified psychosis not due to a substance or known physiological condition: Secondary | ICD-10-CM

## 2018-04-08 DIAGNOSIS — F03918 Unspecified dementia, unspecified severity, with other behavioral disturbance: Secondary | ICD-10-CM | POA: Insufficient documentation

## 2018-04-08 HISTORY — DX: Unspecified psychosis not due to a substance or known physiological condition: F29

## 2018-04-19 ENCOUNTER — Encounter (HOSPITAL_COMMUNITY)
Admission: RE | Admit: 2018-04-19 | Discharge: 2018-04-19 | Disposition: A | Discharge status: Home / Self Care | Payer: Medicare HMO | Source: Skilled Nursing Facility | Attending: Adult Health | Admitting: Adult Health

## 2018-04-19 ENCOUNTER — Non-Acute Institutional Stay (SKILLED_NURSING_FACILITY): Payer: Medicare HMO | Admitting: Adult Health

## 2018-04-19 ENCOUNTER — Encounter: Payer: Self-pay | Admitting: Adult Health

## 2018-04-19 DIAGNOSIS — M6281 Muscle weakness (generalized): Secondary | ICD-10-CM | POA: Insufficient documentation

## 2018-04-19 DIAGNOSIS — E039 Hypothyroidism, unspecified: Secondary | ICD-10-CM

## 2018-04-19 DIAGNOSIS — R062 Wheezing: Secondary | ICD-10-CM | POA: Insufficient documentation

## 2018-04-19 DIAGNOSIS — I2510 Atherosclerotic heart disease of native coronary artery without angina pectoris: Secondary | ICD-10-CM | POA: Insufficient documentation

## 2018-04-19 DIAGNOSIS — R600 Localized edema: Secondary | ICD-10-CM | POA: Insufficient documentation

## 2018-04-19 DIAGNOSIS — Z9181 History of falling: Secondary | ICD-10-CM | POA: Insufficient documentation

## 2018-04-19 DIAGNOSIS — R569 Unspecified convulsions: Secondary | ICD-10-CM | POA: Insufficient documentation

## 2018-04-19 DIAGNOSIS — R279 Unspecified lack of coordination: Secondary | ICD-10-CM | POA: Diagnosis present

## 2018-04-19 LAB — TSH: TSH: 93.852 u[IU]/mL — ABNORMAL HIGH (ref 0.350–4.500)

## 2018-04-19 NOTE — Progress Notes (Signed)
Location:   [facility] Room Number: [place] of Service:  SNF (31)   CODE STATUS: DNR  Allergies  Allergen Reactions  . Levofloxacin     Chief Complaint  Patient presents with  . Acute Visit    Hypothyroidism    HPI:  She is a 85 year old long term resident of this facility; for whom the goal of her care is comfort only. She is not on medications for her heart failure; seizures or afib per family request. Her synthroid had been stopped upon admission to this facility. Her tsh lab came back today at 93.852. she had been on synthroid 50 mcg daily in the past. There are no reports of changes in appetite; no anxiety or agitation present. There are no reports of insomnia present.   Past Medical History:  Diagnosis Date  . Atrial fibrillation ([place])   . GERD (gastroesophageal reflux disease)   . High cholesterol   . Hypertension   . Lung cancer ([place])   . Thyroid disease   . TIA (transient ischemic attack)     Past Surgical History:  Procedure Laterality Date  . heart stent    . LOBECTOMY    . REPLACEMENT TOTAL KNEE Bilateral     Social History   Socioeconomic History  . Marital status: Widowed    Spouse name: Not on file  . Number of children: Not on file  . Years of education: Not on file  . Highest education level: Not on file  Occupational History  . Not on file  Social Needs  . Financial resource strain: Not on file  . Food insecurity:    Worry: Not on file    Inability: Not on file  . Transportation needs:    Medical: Not on file    Non-medical: Not on file  Tobacco Use  . Smoking status: Never Smoker  . Smokeless tobacco: Never Used  Substance and Sexual Activity  . Alcohol use: Not Currently  . Drug use: No  . Sexual activity: Not Currently  Lifestyle  . Physical activity:    Days per week: Not on file    Minutes per session: Not on file  . Stress: Not on file  Relationships  . Social connections:    Talks on  phone: Not on file    Gets together: Not on file    Attends religious service: Not on file    Active member of club or organization: Not on file    Attends meetings of clubs or organizations: Not on file    Relationship status: Not on file  . Intimate partner violence:    Fear of current or ex partner: Not on file    Emotionally abused: Not on file    Physically abused: Not on file    Forced sexual activity: Not on file  Other Topics Concern  . Not on file  Social History Narrative  . Not on file   History reviewed. No pertinent family history.    VITAL SIGNS BP 125/74   Pulse 61   Temp 98.5 F (36.9 C)   Resp 20   Ht 5\' 2"  (1.575 m)   Wt 99 lb 6.4 oz (45.1 kg)   BMI 18.18 kg/m   Outpatient Encounter Medications as of 04/19/2018  Medication Sig  . ALPRAZolam (XANAX) 0.25 MG tablet Take 0.25 mg by mouth at bedtime.   . busPIRone (BUSPAR) 5 MG tablet Take 5 mg by mouth 2 (  two) times daily.  . NON FORMULARY Diet Type:  Finely chopped, NAS  . oseltamivir (TAMIFLU) 75 MG capsule Take 75 mg by mouth at bedtime.  Marland Kitchen QUEtiapine (SEROQUEL) 25 MG tablet Take 12.5 mg by mouth 2 (two) times daily.  . Wound Dressings (ALLEVYN ADHESIVE EX) Apply topically to both heels, change every 3 days and as needed for heel protection   No facility-administered encounter medications on file as of 04/19/2018.      SIGNIFICANT DIAGNOSTIC EXAMS  LABS REVIEWED TODAY;   03-17-18: wbc 5.7; hgb 12.4; hct 38.8; mcv 108.1; plt 224; glucose 85; bun 13; creat 0.91; k+ 3.3; na++ 138; ca 8.7 protein 5.7 albumin 3.1 hgb 4.8 03-20-18: glucose 74; bun 14; creat 0.81; k+ 3.5; na++ 137; ca 9.2 04-19-28: tsh 93.852  Review of Systems  Unable to perform ROS: Dementia (unable to participate )   Physical Exam Constitutional:      General: She is not in acute distress.    Appearance: She is well-developed. She is not diaphoretic.     Comments: Frail   Neck:     Musculoskeletal: Neck supple.     Thyroid: No  thyromegaly.  Cardiovascular:     Rate and Rhythm: Normal rate and regular rhythm.     Pulses: Normal pulses.     Heart sounds: Normal heart sounds.  Pulmonary:     Effort: Pulmonary effort is normal. No respiratory distress.     Breath sounds: Normal breath sounds.  Abdominal:     General: Bowel sounds are normal. There is no distension.     Palpations: Abdomen is soft.     Tenderness: There is no abdominal tenderness.  Musculoskeletal:     Right lower leg: No edema.     Left lower leg: No edema.     Comments: Is able to move all extremities  History of bilateral knee replacements   Lymphadenopathy:     Cervical: No cervical adenopathy.  Skin:    General: Skin is warm and dry.  Neurological:     Mental Status: She is alert. Mental status is at baseline.  Psychiatric:        Mood and Affect: Mood normal.        ASSESSMENT/ PLAN:  TODAY;   1. Acquired hypothyroidism: is worse: tsh 93.852 will begin synthroid 50 mcg daily and will check labs on 05-18-18           MD is aware of resident's narcotic use and is in agreement with current plan of care. We will attempt to wean resident as apropriate   Ok Edwards NP [facility]  Contact [phone] Monday through Friday 8am- 5pm  After hours call [phone]

## 2018-04-25 ENCOUNTER — Encounter: Payer: Self-pay | Admitting: Adult Health

## 2018-04-25 DIAGNOSIS — E039 Hypothyroidism, unspecified: Secondary | ICD-10-CM | POA: Insufficient documentation

## 2018-05-05 ENCOUNTER — Encounter: Payer: Self-pay | Admitting: Adult Health

## 2018-05-05 ENCOUNTER — Non-Acute Institutional Stay (SKILLED_NURSING_FACILITY): Payer: Medicare HMO | Admitting: Adult Health

## 2018-05-05 DIAGNOSIS — E039 Hypothyroidism, unspecified: Secondary | ICD-10-CM

## 2018-05-05 DIAGNOSIS — I509 Heart failure, unspecified: Secondary | ICD-10-CM

## 2018-05-05 DIAGNOSIS — F419 Anxiety disorder, unspecified: Secondary | ICD-10-CM

## 2018-05-05 DIAGNOSIS — I4891 Unspecified atrial fibrillation: Secondary | ICD-10-CM | POA: Diagnosis not present

## 2018-05-05 DIAGNOSIS — F03918 Unspecified dementia, unspecified severity, with other behavioral disturbance: Secondary | ICD-10-CM

## 2018-05-05 DIAGNOSIS — F0391 Unspecified dementia with behavioral disturbance: Secondary | ICD-10-CM | POA: Diagnosis not present

## 2018-05-05 DIAGNOSIS — R569 Unspecified convulsions: Secondary | ICD-10-CM

## 2018-05-05 NOTE — Progress Notes (Signed)
Location:    [facility] Room Number: 140/W Place of Service:  SNF (31)   CODE STATUS: DNR  Allergies  Allergen Reactions  . Levofloxacin     Chief Complaint  Patient presents with  . Medical Management of Chronic Issues    Routine visit of medical management  Acquired hypothyroidism; congestive heart failure unspecified HF chronicity unspecified heart failure type; atrial fibrillation unspecified type.     HPI:  She is a 85 year old long term resident of this facility being seen for the management of her chronic illnesses: hypothyroidism; chf; afib. There are no reports of agitation or anxiety; no changes in appetite; no reports of fevers. She is a comfort care only patient.   Past Medical History:  Diagnosis Date  . Alcohol abuse 02/07/2018  . Atrial fibrillation ([place])   . CHF (congestive heart failure) ([place]) 02/07/2018  . Dementia ([place]) 02/07/2018  . GERD (gastroesophageal reflux disease)   . High cholesterol   . Hypertension   . Lung cancer ([place])   . Psychosis ([place]) 04/08/2018  . Seizures ([place]) 02/07/2018  . Thyroid disease   . TIA (transient ischemic attack)     Past Surgical History:  Procedure Laterality Date  . heart stent    . LOBECTOMY    . REPLACEMENT TOTAL KNEE Bilateral     Social History   Socioeconomic History  . Marital status: Widowed    Spouse name: Not on file  . Number of children: Not on file  . Years of education: Not on file  . Highest education level: Not on file  Occupational History  . Not on file  Social Needs  . Financial resource strain: Not on file  . Food insecurity:    Worry: Not on file    Inability: Not on file  . Transportation needs:    Medical: Not on file    Non-medical: Not on file  Tobacco Use  . Smoking status: Never Smoker  . Smokeless tobacco: Never Used  Substance and Sexual Activity  . Alcohol use: Not Currently  . Drug use: No  . Sexual activity: Not Currently  Lifestyle  .  Physical activity:    Days per week: Not on file    Minutes per session: Not on file  . Stress: Not on file  Relationships  . Social connections:    Talks on phone: Not on file    Gets together: Not on file    Attends religious service: Not on file    Active member of club or organization: Not on file    Attends meetings of clubs or organizations: Not on file    Relationship status: Not on file  . Intimate partner violence:    Fear of current or ex partner: Not on file    Emotionally abused: Not on file    Physically abused: Not on file    Forced sexual activity: Not on file  Other Topics Concern  . Not on file  Social History Narrative  . Not on file   History reviewed. No pertinent family history.    VITAL SIGNS BP 113/74   Pulse 88   Temp 98.8 F (37.1 C) (Oral)   Resp 16   Ht 5\' 2"  (1.575 m)   Wt 98 lb 12.8 oz (44.8 kg)   BMI 18.07 kg/m   Outpatient Encounter Medications as of 05/05/2018  Medication Sig  . ALPRAZolam (XANAX) 0.25 MG tablet Take 0.25 mg by mouth at bedtime.   Marland Kitchen  busPIRone (BUSPAR) 5 MG tablet Take 5 mg by mouth 2 (two) times daily.  Marland Kitchen levothyroxine (SYNTHROID, LEVOTHROID) 50 MCG tablet Take 50 mcg by mouth daily before breakfast.  . NON FORMULARY Diet Type:  Finely chopped, NAS  . QUEtiapine (SEROQUEL) 25 MG tablet Take 12.5 mg by mouth 2 (two) times daily.  . Wound Dressings (ALLEVYN ADHESIVE EX) Apply topically to both heels, change every 3 days and as needed for heel protection   No facility-administered encounter medications on file as of 05/05/2018.      SIGNIFICANT DIAGNOSTIC EXAMS   LABS REVIEWED PREVIOUS;   03-17-18: wbc 5.7; hgb 12.4; hct 38.8; mcv 108.1; plt 224; glucose 85; bun 13; creat 0.91; k+ 3.3; na++ 138; ca 8.7 protein 5.7 albumin 3.1 hgb 4.8 03-20-18: glucose 74; bun 14; creat 0.81; k+ 3.5; na++ 137; ca 9.2 04-19-28: tsh 93.852  NO NEW LABS.   Review of Systems  Unable to perform ROS: Dementia (unable to participate )     Physical Exam Constitutional:      General: She is not in acute distress.    Appearance: She is well-developed. She is not diaphoretic.     Comments: frail  Neck:     Musculoskeletal: Neck supple.     Thyroid: No thyromegaly.  Cardiovascular:     Rate and Rhythm: Normal rate and regular rhythm.     Pulses: Normal pulses.     Heart sounds: Normal heart sounds.  Pulmonary:     Effort: Pulmonary effort is normal. No respiratory distress.     Breath sounds: Normal breath sounds.  Abdominal:     General: Bowel sounds are normal. There is no distension.     Palpations: Abdomen is soft.     Tenderness: There is no abdominal tenderness.  Musculoskeletal:     Right lower leg: No edema.     Left lower leg: No edema.     Comments: Is able to move all extremities  History of bilateral knee replacements    Lymphadenopathy:     Cervical: No cervical adenopathy.  Skin:    General: Skin is warm and dry.  Neurological:     Mental Status: She is alert. Mental status is at baseline.  Psychiatric:        Mood and Affect: Mood normal.       ASSESSMENT/ PLAN:  TODAY;   1. Acquired hypothyroidism: is without change tsh 93.852 will continue synthroid 50 mcg daily labs due 05-18-18   2. Unspecified CHF (congestive heart failure) is stable will continue to monitor her status.   3. Atrial fibrillation: heart rate is stable will continue to monitor   4. Unspecified dementia type with behavorial disturbance: is without change: weight is 98 pounds; the goal of her care is for comfort will monitor her status.   5. Seizures: is without recent seizure activity will monitor  6. Psychosis in elderly with behavioral disturbance: is emotionally stable will continue seroquel 12.5 mg twice daily   7. Chronic anxiety: is stable will continue xanax 0.25 mg nightly     MD is aware of resident's narcotic use and is in agreement with current plan of care. We will attempt to wean resident as apropriate    Ok Edwards NP [facility]  Contact [phone] Monday through Friday 8am- 5pm  After hours call [phone]

## 2018-05-11 DIAGNOSIS — F419 Anxiety disorder, unspecified: Secondary | ICD-10-CM | POA: Insufficient documentation

## 2018-05-18 ENCOUNTER — Encounter (HOSPITAL_COMMUNITY)
Admission: RE | Admit: 2018-05-18 | Discharge: 2018-05-18 | Disposition: A | Discharge status: Home / Self Care | Payer: Medicare HMO | Source: Skilled Nursing Facility | Attending: Adult Health | Admitting: Adult Health

## 2018-05-18 DIAGNOSIS — R569 Unspecified convulsions: Secondary | ICD-10-CM | POA: Insufficient documentation

## 2018-05-18 DIAGNOSIS — M6281 Muscle weakness (generalized): Secondary | ICD-10-CM | POA: Diagnosis present

## 2018-05-18 DIAGNOSIS — Z9181 History of falling: Secondary | ICD-10-CM | POA: Insufficient documentation

## 2018-05-18 DIAGNOSIS — R600 Localized edema: Secondary | ICD-10-CM | POA: Diagnosis present

## 2018-05-18 DIAGNOSIS — R062 Wheezing: Secondary | ICD-10-CM | POA: Insufficient documentation

## 2018-05-18 DIAGNOSIS — R279 Unspecified lack of coordination: Secondary | ICD-10-CM | POA: Insufficient documentation

## 2018-05-18 DIAGNOSIS — I2510 Atherosclerotic heart disease of native coronary artery without angina pectoris: Secondary | ICD-10-CM | POA: Diagnosis present

## 2018-05-18 LAB — TSH: TSH: 31.786 u[IU]/mL — ABNORMAL HIGH (ref 0.350–4.500)

## 2018-05-29 ENCOUNTER — Other Ambulatory Visit: Payer: Self-pay | Admitting: Internal Medicine

## 2018-05-29 MED ORDER — ALPRAZOLAM 0.25 MG PO TABS: 0.2500 mg | ORAL_TABLET | Freq: Every day | ORAL | 0 refills | Status: DC

## 2018-06-01 ENCOUNTER — Non-Acute Institutional Stay (SKILLED_NURSING_FACILITY): Payer: Medicare HMO | Admitting: Internal Medicine

## 2018-06-01 ENCOUNTER — Ambulatory Visit (HOSPITAL_COMMUNITY): Payer: Medicare HMO | Attending: Internal Medicine

## 2018-06-01 ENCOUNTER — Encounter: Payer: Self-pay | Admitting: Internal Medicine

## 2018-06-01 DIAGNOSIS — R52 Pain, unspecified: Secondary | ICD-10-CM | POA: Diagnosis present

## 2018-06-01 DIAGNOSIS — F0391 Unspecified dementia with behavioral disturbance: Secondary | ICD-10-CM | POA: Diagnosis not present

## 2018-06-01 DIAGNOSIS — R609 Edema, unspecified: Secondary | ICD-10-CM | POA: Insufficient documentation

## 2018-06-01 DIAGNOSIS — S93432A Sprain of tibiofibular ligament of left ankle, initial encounter: Secondary | ICD-10-CM

## 2018-06-01 NOTE — Progress Notes (Signed)
Location:    [facility] Room Number: 140/W Place of Service:  SNF (31) Provider:  Veleta Miners MD  System, Pcp Not In  Patient Care Team: System, Pcp Not In as PCP - General  Extended Emergency Contact Information Primary Emergency Contact: KELLY,WENDY Address: [address], [place] [address] [place] [employer] Phone: [phone] Relation: Daughter Secondary Emergency Contact: Quentin Ore Address: [address] [place] [address] [place] [employer] Phone: [phone] Relation: Daughter  Code Status:  DNR Goals of care: Advanced Directive information Advanced Directives 06/01/2018  Does Patient Have a Medical Advance Directive? Yes  Type of Advance Directive Out of facility DNR (pink MOST or yellow form)  Does patient want to make changes to medical advance directive? No - Patient declined  Pre-existing out of facility DNR order (yellow form or pink MOST form) -     Chief Complaint  Patient presents with  . Acute Visit    Left Ankle Swelling    HPI:  Pt is a 85 y.o. female seen today for an acute visit for Left Ankle swelling with Pain  She has h/o Atrial Fibrillation, GERD, Dementia, Seizures,Hypertension, Hypothyroid, CHF, and Hyperlipidemia, h/o ETOH abuse,Weight loss She has been in facility from a place in [place]. Most of the history is from her daughter Nurses had Noticed that patient was c/o Pain in her Left Ankle . There is no h/o Falls or any other injury. She herself is unable to give any history. She was sitting comfortably in wheelchair. Was not c/o Any pain.       Past Medical History:  Diagnosis Date  . Alcohol abuse 02/07/2018  . Atrial fibrillation ([place])   . CHF (congestive heart failure) ([place]) 02/07/2018  . Dementia ([place]) 02/07/2018  . GERD (gastroesophageal reflux disease)   . High cholesterol   . Hypertension   . Lung cancer ([place])   . Psychosis  ([place]) 04/08/2018  . Seizures ([place]) 02/07/2018  . Thyroid disease   . TIA (transient ischemic attack)    Past Surgical History:  Procedure Laterality Date  . heart stent    . LOBECTOMY    . REPLACEMENT TOTAL KNEE Bilateral     Allergies  Allergen Reactions  . Levofloxacin     Outpatient Encounter Medications as of 06/01/2018  Medication Sig  . ALPRAZolam (XANAX) 0.25 MG tablet Take 1 tablet (0.25 mg total) by mouth at bedtime.  . busPIRone (BUSPAR) 5 MG tablet Take 5 mg by mouth 2 (two) times daily.  Marland Kitchen levothyroxine (SYNTHROID, LEVOTHROID) 50 MCG tablet Take 50 mcg by mouth daily before breakfast.  . NON FORMULARY Diet Type:  Finely chopped, NAS  . QUEtiapine (SEROQUEL) 25 MG tablet Take 12.5 mg by mouth 2 (two) times daily.  . Wound Dressings (ALLEVYN ADHESIVE EX) Apply topically to both heels, change every 3 days and as needed for heel protection   No facility-administered encounter medications on file as of 06/01/2018.     Review of Systems  Unable to perform ROS: Dementia     There is no immunization history on file for this patient. Pertinent  Health Maintenance Due  Topic Date Due  . DEXA SCAN  07/01/2018 (Originally 09[birth year]06/1989)  . PNA vac Low Risk Adult (1 of 2 - PCV13) 07/01/2018 (Originally 09/06/1987)  .  INFLUENZA VACCINE  09/23/2018   No flowsheet data found. Functional Status Survey:    Vitals:   06/02/18 1153  BP: 97/73  Pulse: 81  Resp: 20  Temp: (!) 97.1 F (36.2 C)   There is no height or weight on file to calculate BMI. Physical Exam Vitals signs reviewed.  Constitutional:      Appearance: Normal appearance.  HENT:     Head: Normocephalic.     Nose: Nose normal.     Mouth/Throat:     Mouth: Mucous membranes are moist.     Pharynx: Oropharynx is clear.  Eyes:     Pupils: Pupils are equal, round, and reactive to light.  Neck:     Musculoskeletal: Neck supple.  Cardiovascular:     Rate and Rhythm: Normal rate and regular rhythm.     Pulses:  Normal pulses.     Heart sounds: No murmur.  Pulmonary:     Effort: Pulmonary effort is normal.     Breath sounds: Normal breath sounds.  Abdominal:     General: Abdomen is flat. Bowel sounds are normal.     Palpations: Abdomen is soft.  Musculoskeletal:     Comments: Has Mild swelling in her Left ankle. It was not tender but when I did Lateral movement she c/o Pain. No Pain on Flexion   Skin:    General: Skin is warm and dry.  Neurological:     General: No focal deficit present.     Mental Status: She is alert.     Comments: Is not oriented. And stays Confused Usually stays in a wheelchair.  Psychiatric:        Mood and Affect: Mood normal.        Thought Content: Thought content normal.     Labs reviewed: Recent Labs    03/17/18 0759 03/20/18 1157  NA 138 137  K 3.3* 3.5  CL 106 103  CO2 26 27  GLUCOSE 85 74  BUN 13 14  CREATININE 0.91 0.81  CALCIUM 8.7* 9.2   Recent Labs    03/17/18 0759  AST 20  ALT 14  ALKPHOS 81  BILITOT 0.9  PROT 5.7*  ALBUMIN 3.1*   Recent Labs    03/17/18 0759  WBC 5.7  NEUTROABS 3.8  HGB 12.4  HCT 38.8  MCV 108.1*  PLT 224   Lab Results  Component Value Date   TSH 31.786 (H) 05/18/2018   Lab Results  Component Value Date   HGBA1C 4.8 03/17/2018   No results found for: CHOL, HDL, LDLCALC, LDLDIRECT, TRIG, CHOLHDL  Significant Diagnostic Results in last 30 days:  Dg Ankle Complete Left  Result Date: 06/01/2018 CLINICAL DATA:  85 year old female with left ankle swelling EXAM: LEFT ANKLE COMPLETE - 3+ VIEW COMPARISON:  01/14/2013 FINDINGS: Osteopenia. Circumferential soft tissue swelling. No acute displaced fracture. Degenerative changes on the medial malleolus. Ankle mortise appears congruent. Lateral view demonstrates small joint effusion at the tibiotalar joint. IMPRESSION: Negative for acute bony abnormality. Joint effusion evident on the lateral view at the tibiotalar joint. Osteopenia. Electronically Signed   By: Corrie Mckusick D.O.   On: 06/01/2018 14:58    Assessment/Plan  Left Ankle Pain and Swelling Will order stat Ankle Xray Xray came back Negative for any acute bone abnormality Mild Joint effusion on lateral View  Will start her on Mobic 15 mg For 7 days Ankle brace for 14 days   Family/ staff Communication:   Labs/tests ordered:   Total  time spent in this patient care encounter was 25_ minutes; greater than 50% of the visit spent counseling patient and staff, reviewing records , Labs and coordinating care for problems addressed at this encounter.

## 2018-06-02 ENCOUNTER — Encounter: Payer: Self-pay | Admitting: Internal Medicine

## 2018-06-02 DIAGNOSIS — S93402A Sprain of unspecified ligament of left ankle, initial encounter: Secondary | ICD-10-CM | POA: Insufficient documentation

## 2018-06-08 ENCOUNTER — Encounter: Payer: Self-pay | Admitting: Internal Medicine

## 2018-06-08 ENCOUNTER — Non-Acute Institutional Stay (SKILLED_NURSING_FACILITY): Payer: Medicare HMO | Admitting: Internal Medicine

## 2018-06-08 DIAGNOSIS — E039 Hypothyroidism, unspecified: Secondary | ICD-10-CM

## 2018-06-08 DIAGNOSIS — F03918 Unspecified dementia, unspecified severity, with other behavioral disturbance: Secondary | ICD-10-CM

## 2018-06-08 DIAGNOSIS — R634 Abnormal weight loss: Secondary | ICD-10-CM | POA: Diagnosis not present

## 2018-06-08 DIAGNOSIS — F0391 Unspecified dementia with behavioral disturbance: Secondary | ICD-10-CM

## 2018-06-08 NOTE — Progress Notes (Signed)
Location:  [facility] Room Number: [place] of Service:  SNF (31) Provider:  Veleta Miners, MD  System, Pcp Not In  Patient Care Team: System, Pcp Not In as PCP - General  Extended Emergency Contact Information Primary Emergency Contact: KELLY,WENDY Address: [address], [place] [address] [place] [employer] Phone: [phone] Relation: Daughter Secondary Emergency Contact: Quentin Ore Address: [address] [place] [address] [place] [employer] [place] Phone: [phone] Relation: Daughter  Code Status:  DNR Goals of care: Advanced Directive information Advanced Directives 06/08/2018  Does Patient Have a Medical Advance Directive? -  Type of Advance Directive Out of facility DNR (pink MOST or yellow form)  Does patient want to make changes to medical advance directive? No - Patient declined  Pre-existing out of facility DNR order (yellow form or pink MOST form) Yellow form placed in chart (order not valid for inpatient use);Pink MOST form placed in chart (order not valid for inpatient use)     Chief Complaint  Patient presents with  . Medical Management of Chronic Issues    Anxiety, Hypothyroidism    HPI:  Pt is a 85 y.o. female seen today for medical management of chronic diseases.   She is long term resident of facility She has h/o Atrial Fibrillation, GERD, Dementia, Seizures,Hypertension, Hypothyroid,CHF, and Hyperlipidemia Her daughter had wanted her to be Comfort care with minimal meds. So she is on Meds for her Hypothyroid and Seroquel and xanax for her behavior She did develop some swelling in her Left ankle leg and has brace on . Her xray of ankle showed only swelling no Fracture. She is doing good in facility and has adjusted to the staff..She continues to be very confused. Roams around in her Wheelchair. No other acute issues No New nursing issues. Patient could not give  me any history She would not even follow any commands. She has lost some weight recently but her appetite is good.      Past Medical History:  Diagnosis Date  . Alcohol abuse 02/07/2018  . Atrial fibrillation ([place])   . CHF (congestive heart failure) ([place]) 02/07/2018  . Dementia ([place]) 02/07/2018  . GERD (gastroesophageal reflux disease)   . High cholesterol   . Hypertension   . Lung cancer ([place])   . Psychosis ([place]) 04/08/2018  . Seizures ([place]) 02/07/2018  . Thyroid disease   . TIA (transient ischemic attack)    Past Surgical History:  Procedure Laterality Date  . heart stent    . LOBECTOMY    . REPLACEMENT TOTAL KNEE Bilateral     Allergies  Allergen Reactions  . Levofloxacin     Outpatient Encounter Medications as of 06/08/2018  Medication Sig  . ALPRAZolam (XANAX) 0.25 MG tablet Take 1 tablet (0.25 mg total) by mouth at bedtime.  . busPIRone (BUSPAR) 5 MG tablet Take 5 mg by mouth 2 (two) times daily.  Marland Kitchen levothyroxine (SYNTHROID, LEVOTHROID) 50 MCG tablet Take 50 mcg by mouth daily before breakfast.  . meloxicam (MOBIC) 15 MG tablet Take 15 mg by mouth daily.  . NON FORMULARY Diet Type:  Finely chopped, NAS  . QUEtiapine (SEROQUEL) 25 MG tablet Take 12.5 mg by mouth 2 (two) times daily.  . [DISCONTINUED] Wound Dressings (ALLEVYN ADHESIVE EX) Apply topically to both heels,  change every 3 days and as needed for heel protection   No facility-administered encounter medications on file as of 06/08/2018.     Review of Systems  Unable to perform ROS: Dementia     There is no immunization history on file for this patient. Pertinent  Health Maintenance Due  Topic Date Due  . DEXA SCAN  07/01/2018 (Originally 09[birth year]/1989)  . PNA vac Low Risk Adult (1 of 2 - PCV13) 07/01/2018 (Originally 09/06/1987)  . INFLUENZA VACCINE  09/23/2018   No flowsheet data found. Functional Status Survey:    Vitals:   06/08/18 0943  BP: 95/64  Pulse: 95  Resp: 20  Temp: (!) 97.5 F  (36.4 C)  Weight: 92 lb 9.6 oz (42 kg)  Height: 5\' 2"  (1.575 m)   Body mass index is 16.94 kg/m. Physical Exam Vitals signs reviewed.  HENT:     Head: Normocephalic.     Nose: Nose normal.     Mouth/Throat:     Mouth: Mucous membranes are moist.     Pharynx: Oropharynx is clear.  Eyes:     Pupils: Pupils are equal, round, and reactive to light.  Neck:     Musculoskeletal: Neck supple.  Cardiovascular:     Rate and Rhythm: Normal rate and regular rhythm.     Pulses: Normal pulses.     Heart sounds: Normal heart sounds.  Pulmonary:     Effort: Pulmonary effort is normal. No respiratory distress.     Breath sounds: Normal breath sounds. No wheezing or rales.  Abdominal:     General: Abdomen is flat. Bowel sounds are normal. There is no distension.     Palpations: Abdomen is soft.     Tenderness: There is no abdominal tenderness.  Musculoskeletal:     Comments: Brace on Left Ankle  Skin:    General: Skin is warm and dry.  Neurological:     General: No focal deficit present.     Mental Status: She is alert.     Comments: Not oriented. Would not follow commands today Is wheelchair bound  Psychiatric:        Mood and Affect: Mood normal.        Thought Content: Thought content normal.        Judgment: Judgment normal.     Labs reviewed: Recent Labs    03/17/18 0759 03/20/18 1157  NA 138 137  K 3.3* 3.5  CL 106 103  CO2 26 27  GLUCOSE 85 74  BUN 13 14  CREATININE 0.91 0.81  CALCIUM 8.7* 9.2   Recent Labs    03/17/18 0759  AST 20  ALT 14  ALKPHOS 81  BILITOT 0.9  PROT 5.7*  ALBUMIN 3.1*   Recent Labs    03/17/18 0759  WBC 5.7  NEUTROABS 3.8  HGB 12.4  HCT 38.8  MCV 108.1*  PLT 224   Lab Results  Component Value Date   TSH 31.786 (H) 05/18/2018   Lab Results  Component Value Date   HGBA1C 4.8 03/17/2018   No results found for: CHOL, HDL, LDLCALC, LDLDIRECT, TRIG, CHOLHDL  Significant Diagnostic Results in last 30 days:  Dg Ankle  Complete Left  Result Date: 06/01/2018 CLINICAL DATA:  85 year old female with left ankle swelling EXAM: LEFT ANKLE COMPLETE - 3+ VIEW COMPARISON:  01/14/2013 FINDINGS: Osteopenia. Circumferential soft tissue swelling. No acute displaced fracture. Degenerative changes on the medial malleolus. Ankle mortise appears congruent. Lateral view demonstrates small joint effusion at the tibiotalar  joint. IMPRESSION: Negative for acute bony abnormality. Joint effusion evident on the lateral view at the tibiotalar joint. Osteopenia. Electronically Signed   By: Corrie Mckusick D.O.   On: 06/01/2018 14:58    Assessment/Plan  Left Ankle pain and swelling Will continue Brace for 2 weeks She got Mobic for 1 week Will re Eval after 2 week Her Xray of ankle was negative for fracture Weight loss She has lost few pounds recently Continue to watch if keeps loosing consider Remeron Macrocytosis Check vit B12 level Dementia with behavior issues   contineu on Buspar , xanax and Seroquel  Will try to taper Seroquel when her daughter can start visiting her again Hypothyroid On Supplement Repeat TSH pending    Family/ staff Communication:   Labs/tests ordered:  Vit B12 level Total time spent in this patient care encounter was  25_  minutes; greater than 50% of the visit spent counseling patient and staff, reviewing records , Labs and coordinating care for problems addressed at this encounter.

## 2018-06-26 ENCOUNTER — Other Ambulatory Visit: Payer: Self-pay | Admitting: Adult Health

## 2018-06-26 MED ORDER — ALPRAZOLAM 0.25 MG PO TABS: 0.2500 mg | ORAL_TABLET | Freq: Every day | ORAL | 0 refills | Status: DC

## 2018-06-29 ENCOUNTER — Encounter (HOSPITAL_COMMUNITY)
Admission: RE | Admit: 2018-06-29 | Discharge: 2018-06-29 | Disposition: A | Discharge status: Home / Self Care | Payer: Medicare HMO | Source: Skilled Nursing Facility | Attending: Internal Medicine | Admitting: Internal Medicine

## 2018-06-29 DIAGNOSIS — R600 Localized edema: Secondary | ICD-10-CM | POA: Diagnosis not present

## 2018-06-29 DIAGNOSIS — R569 Unspecified convulsions: Secondary | ICD-10-CM | POA: Insufficient documentation

## 2018-06-29 DIAGNOSIS — F0391 Unspecified dementia with behavioral disturbance: Secondary | ICD-10-CM | POA: Insufficient documentation

## 2018-06-29 DIAGNOSIS — E876 Hypokalemia: Secondary | ICD-10-CM | POA: Insufficient documentation

## 2018-06-29 DIAGNOSIS — R279 Unspecified lack of coordination: Secondary | ICD-10-CM | POA: Diagnosis not present

## 2018-06-29 DIAGNOSIS — Z9181 History of falling: Secondary | ICD-10-CM | POA: Diagnosis not present

## 2018-06-29 DIAGNOSIS — I10 Essential (primary) hypertension: Secondary | ICD-10-CM | POA: Diagnosis present

## 2018-06-29 DIAGNOSIS — M6281 Muscle weakness (generalized): Secondary | ICD-10-CM | POA: Diagnosis present

## 2018-06-29 LAB — TSH: TSH: 14.789 u[IU]/mL — ABNORMAL HIGH (ref 0.350–4.500)

## 2018-06-29 LAB — VITAMIN B12: Vitamin B-12: 308 pg/mL (ref 180–914)

## 2018-07-06 ENCOUNTER — Encounter: Payer: Self-pay | Admitting: Adult Health

## 2018-07-06 NOTE — Progress Notes (Signed)
Location:   [facility] Room Number: [place] of Service:  SNF (31)   CODE STATUS: DNR  Allergies  Allergen Reactions  . Levofloxacin     Chief Complaint  Patient presents with  . Acute Visit    Refusing Meds    HPI:    Past Medical History:  Diagnosis Date  . Alcohol abuse 02/07/2018  . Atrial fibrillation ([place])   . CHF (congestive heart failure) ([place]) 02/07/2018  . Dementia ([place]) 02/07/2018  . GERD (gastroesophageal reflux disease)   . High cholesterol   . Hypertension   . Lung cancer ([place])   . Psychosis ([place]) 04/08/2018  . Seizures ([place]) 02/07/2018  . Thyroid disease   . TIA (transient ischemic attack)     Past Surgical History:  Procedure Laterality Date  . heart stent    . LOBECTOMY    . REPLACEMENT TOTAL KNEE Bilateral     Social History   Socioeconomic History  . Marital status: Widowed    Spouse name: Not on file  . Number of children: Not on file  . Years of education: Not on file  . Highest education level: Not on file  Occupational History  . Not on file  Social Needs  . Financial resource strain: Not on file  . Food insecurity:    Worry: Not on file    Inability: Not on file  . Transportation needs:    Medical: Not on file    Non-medical: Not on file  Tobacco Use  . Smoking status: Never Smoker  . Smokeless tobacco: Never Used  Substance and Sexual Activity  . Alcohol use: Not Currently  . Drug use: No  . Sexual activity: Not Currently  Lifestyle  . Physical activity:    Days per week: Not on file    Minutes per session: Not on file  . Stress: Not on file  Relationships  . Social connections:    Talks on phone: Not on file    Gets together: Not on file    Attends religious service: Not on file    Active member of club or organization: Not on file    Attends meetings of clubs or organizations: Not on file    Relationship status: Not on file  . Intimate partner violence:    Fear of current or  ex partner: Not on file    Emotionally abused: Not on file    Physically abused: Not on file    Forced sexual activity: Not on file  Other Topics Concern  . Not on file  Social History Narrative  . Not on file   History reviewed. No pertinent family history.    VITAL SIGNS Ht 5\' 2"  (1.575 m)   Wt 97 lb 12.8 oz (44.4 kg)   BMI 17.89 kg/m   Outpatient Encounter Medications as of 07/06/2018  Medication Sig  . ALPRAZolam (XANAX) 0.25 MG tablet Take 1 tablet (0.25 mg total) by mouth at bedtime.  . busPIRone (BUSPAR) 5 MG tablet Take 5 mg by mouth 2 (two) times daily.  Marland Kitchen levothyroxine (SYNTHROID, LEVOTHROID) 50 MCG tablet Take 50 mcg by mouth daily before breakfast.  . NON FORMULARY Diet Type:  Finely chopped, NAS  . QUEtiapine (SEROQUEL) 25 MG tablet Take 12.5 mg by mouth 2 (two) times daily.   No facility-administered encounter medications on file as of 07/06/2018.      SIGNIFICANT DIAGNOSTIC EXAMS  LABS REVIEWED PREVIOUS;   03-17-18: wbc 5.7; hgb  12.4; hct 38.8; mcv 108.1; plt 224; glucose 85; bun 13; creat 0.91; k+ 3.3; na++ 138; ca 8.7 protein 5.7 albumin 3.1 hgb 4.8 03-20-18: glucose 74; bun 14; creat 0.81; k+ 3.5; na++ 137; ca 9.2 04-19-28: tsh 93.852  NO NEW LABS.   Review of Systems  Unable to perform ROS: Dementia (unable to participate )    Physical Exam Constitutional:      General: She is not in acute distress.    Appearance: She is well-developed. She is not diaphoretic.     Comments: frail  Neck:     Musculoskeletal: Neck supple.     Thyroid: No thyromegaly.  Cardiovascular:     Rate and Rhythm: Normal rate and regular rhythm.     Pulses: Normal pulses.     Heart sounds: Normal heart sounds.  Pulmonary:     Effort: Pulmonary effort is normal. No respiratory distress.     Breath sounds: Normal breath sounds.  Abdominal:     General: Bowel sounds are normal. There is no distension.     Palpations: Abdomen is soft.     Tenderness: There is no abdominal  tenderness.  Musculoskeletal:     Right lower leg: No edema.     Left lower leg: No edema.     Comments: Is able to move all extremities  History of bilateral knee replacements    Lymphadenopathy:     Cervical: No cervical adenopathy.  Skin:    General: Skin is warm and dry.  Neurological:     Mental Status: She is alert. Mental status is at baseline.  Psychiatric:        Mood and Affect: Mood normal.       ASSESSMENT/ PLAN:  TODAY;   1. Acquired hypothyroidism: is without change tsh 93.852 will continue synthroid 50 mcg daily labs due 05-18-18   2. Unspecified CHF (congestive heart failure) is stable will continue to monitor her status.   3. Atrial fibrillation: heart rate is stable will continue to monitor   4. Unspecified dementia type with behavorial disturbance: is without change: weight is 98 pounds; the goal of her care is for comfort will monitor her status.   5. Seizures: is without recent seizure activity will monitor  6. Psychosis in elderly with behavioral disturbance: is emotionally stable will continue seroquel 12.5 mg twice daily   7. Chronic anxiety: is stable will continue xanax 0.25 mg nightly   MD is aware of resident's narcotic use and is in agreement with current plan of care. We will attempt to wean resident as apropriate   Ok Edwards NP [facility]  Contact [phone] Monday through Friday 8am- 5pm  After hours call [phone]

## 2018-07-06 NOTE — Progress Notes (Signed)
This encounter was created in error - please disregard.

## 2018-07-10 ENCOUNTER — Non-Acute Institutional Stay (SKILLED_NURSING_FACILITY): Payer: Medicare HMO | Admitting: Adult Health

## 2018-07-10 ENCOUNTER — Encounter: Payer: Self-pay | Admitting: Adult Health

## 2018-07-10 DIAGNOSIS — E039 Hypothyroidism, unspecified: Secondary | ICD-10-CM

## 2018-07-10 DIAGNOSIS — I509 Heart failure, unspecified: Secondary | ICD-10-CM | POA: Diagnosis not present

## 2018-07-10 DIAGNOSIS — I4891 Unspecified atrial fibrillation: Secondary | ICD-10-CM | POA: Diagnosis not present

## 2018-07-10 NOTE — Progress Notes (Signed)
Location:   [facility] Room Number: [place] of Service:  SNF (31)   CODE STATUS: DNR  Allergies  Allergen Reactions  . Levofloxacin     Chief Complaint  Patient presents with  . Medical Management of Chronic Issues    Acquired hypothyroidism; atrial fibrillation unspecified type; chronic congestive heart failure unspecified heart failure type.     HPI:  She is a 85 year old long term resident of this facility being seen for the management of her chronic illnesses; hypothyroidism; afib; chf. There are no reports of uncontrolled pain; no changes in appetite. There are no reports of agitation. No reports of fevers present.   Past Medical History:  Diagnosis Date  . Alcohol abuse 02/07/2018  . Atrial fibrillation ([place])   . CHF (congestive heart failure) ([place]) 02/07/2018  . Dementia ([place]) 02/07/2018  . GERD (gastroesophageal reflux disease)   . High cholesterol   . Hypertension   . Lung cancer ([place])   . Psychosis ([place]) 04/08/2018  . Seizures ([place]) 02/07/2018  . Thyroid disease   . TIA (transient ischemic attack)     Past Surgical History:  Procedure Laterality Date  . heart stent    . LOBECTOMY    . REPLACEMENT TOTAL KNEE Bilateral     Social History   Socioeconomic History  . Marital status: Widowed    Spouse name: Not on file  . Number of children: Not on file  . Years of education: Not on file  . Highest education level: Not on file  Occupational History  . Not on file  Social Needs  . Financial resource strain: Not on file  . Food insecurity:    Worry: Not on file    Inability: Not on file  . Transportation needs:    Medical: Not on file    Non-medical: Not on file  Tobacco Use  . Smoking status: Never Smoker  . Smokeless tobacco: Never Used  Substance and Sexual Activity  . Alcohol use: Not Currently  . Drug use: No  . Sexual activity: Not Currently  Lifestyle  . Physical activity:    Days per week: Not on file     Minutes per session: Not on file  . Stress: Not on file  Relationships  . Social connections:    Talks on phone: Not on file    Gets together: Not on file    Attends religious service: Not on file    Active member of club or organization: Not on file    Attends meetings of clubs or organizations: Not on file    Relationship status: Not on file  . Intimate partner violence:    Fear of current or ex partner: Not on file    Emotionally abused: Not on file    Physically abused: Not on file    Forced sexual activity: Not on file  Other Topics Concern  . Not on file  Social History Narrative  . Not on file   History reviewed. No pertinent family history.    VITAL SIGNS BP 105/76   Pulse (!) 54   Temp 98 F (36.7 C)   Resp 20   Ht 5\' 2"  (1.575 m)   Wt 97 lb 12.8 oz (44.4 kg)   BMI 17.89 kg/m   Outpatient Encounter Medications as of 07/10/2018  Medication Sig  . ALPRAZolam (XANAX) 0.25 MG tablet Take 1 tablet (0.25 mg total) by mouth at bedtime.  . busPIRone (BUSPAR) 5  MG tablet Take 5 mg by mouth 2 (two) times daily.  Marland Kitchen levothyroxine (SYNTHROID, LEVOTHROID) 50 MCG tablet Take 50 mcg by mouth daily before breakfast.  . NON FORMULARY Diet Type:  Finely chopped, NAS  . QUEtiapine (SEROQUEL) 25 MG tablet Take 12.5 mg by mouth 2 (two) times daily.   No facility-administered encounter medications on file as of 07/10/2018.      SIGNIFICANT DIAGNOSTIC EXAMS  LABS REVIEWED PREVIOUS;   03-17-18: wbc 5.7; hgb 12.4; hct 38.8; mcv 108.1; plt 224; glucose 85; bun 13; creat 0.91; k+ 3.3; na++ 138; ca 8.7 protein 5.7 albumin 3.1 hgb 4.8 03-20-18: glucose 74; bun 14; creat 0.81; k+ 3.5; na++ 137; ca 9.2 04-19-28: tsh 93.852  TODAY;  05-18-18: tsh 31.786 06-29-18: vit B 12: 308; tsh 14.789   Review of Systems  Unable to perform ROS: Dementia (unable to participate )    Physical Exam Constitutional:      General: She is not in acute distress.    Appearance: She is underweight.  She is not diaphoretic.  Neck:     Musculoskeletal: Neck supple.     Thyroid: No thyromegaly.  Cardiovascular:     Rate and Rhythm: Normal rate and regular rhythm.     Pulses: Normal pulses.     Heart sounds: Normal heart sounds.     Comments: History of heart stent Pulmonary:     Effort: Pulmonary effort is normal. No respiratory distress.     Breath sounds: Normal breath sounds.     Comments: History of lobectomy  Abdominal:     General: Bowel sounds are normal. There is no distension.     Palpations: Abdomen is soft.     Tenderness: There is no abdominal tenderness.  Musculoskeletal:     Right lower leg: No edema.     Left lower leg: No edema.     Comments:  Is able to move all extremities  History of bilateral knee replacements     Lymphadenopathy:     Cervical: No cervical adenopathy.  Skin:    General: Skin is warm and dry.  Neurological:     Mental Status: She is alert. Mental status is at baseline.  Psychiatric:        Mood and Affect: Mood normal.       ASSESSMENT/ PLAN:  TODAY;   1. Acquired hypothyroidism is without change tsh 14.789 will continue synthroid 50 mcg daily labs are pending.   2. Unspecified CHF (congestive heart failure) is stable will continue to monitor her status.   3. Atrial fibrillation: heart rate is stable will continue to monitor   PREVIOUS  4. Unspecified dementia type with behavorial disturbance: is without change: weight is 97 (previous 98) pounds; the goal of her care is for comfort will monitor her status.   5. Seizures: is without recent seizure activity will monitor  6. Psychosis in elderly with behavioral disturbance: is emotionally stable will continue seroquel 12.5 mg twice daily   7. Chronic anxiety: is stable will continue xanax 0.25 mg nightly and buspar 5 mg twice daily    MD is aware of resident's narcotic use and is in agreement with current plan of care. We will attempt to wean resident as apropriate   Ok Edwards NP [facility]  Contact [phone] Monday through Friday 8am- 5pm  After hours call [phone]

## 2018-07-11 ENCOUNTER — Encounter (HOSPITAL_COMMUNITY)
Admission: RE | Admit: 2018-07-11 | Discharge: 2018-07-11 | Disposition: A | Discharge status: Home / Self Care | Payer: Medicare HMO | Source: Skilled Nursing Facility | Attending: Adult Health | Admitting: Adult Health

## 2018-07-11 DIAGNOSIS — R600 Localized edema: Secondary | ICD-10-CM | POA: Diagnosis present

## 2018-07-11 DIAGNOSIS — Z9181 History of falling: Secondary | ICD-10-CM | POA: Insufficient documentation

## 2018-07-11 DIAGNOSIS — R279 Unspecified lack of coordination: Secondary | ICD-10-CM | POA: Insufficient documentation

## 2018-07-11 DIAGNOSIS — I10 Essential (primary) hypertension: Secondary | ICD-10-CM | POA: Insufficient documentation

## 2018-07-11 DIAGNOSIS — F0391 Unspecified dementia with behavioral disturbance: Secondary | ICD-10-CM | POA: Insufficient documentation

## 2018-07-11 DIAGNOSIS — E876 Hypokalemia: Secondary | ICD-10-CM | POA: Insufficient documentation

## 2018-07-11 DIAGNOSIS — R569 Unspecified convulsions: Secondary | ICD-10-CM | POA: Insufficient documentation

## 2018-07-11 DIAGNOSIS — M6281 Muscle weakness (generalized): Secondary | ICD-10-CM | POA: Insufficient documentation

## 2018-07-11 LAB — COMPREHENSIVE METABOLIC PANEL
ALT: 13 U/L (ref 0–44)
AST: 18 U/L (ref 15–41)
Albumin: 3.2 g/dL — ABNORMAL LOW (ref 3.5–5.0)
Alkaline Phosphatase: 90 U/L (ref 38–126)
Anion gap: 7 (ref 5–15)
BUN: 24 mg/dL — ABNORMAL HIGH (ref 8–23)
CO2: 26 mmol/L (ref 22–32)
Calcium: 9 mg/dL (ref 8.9–10.3)
Chloride: 108 mmol/L (ref 98–111)
Creatinine, Ser: 0.74 mg/dL (ref 0.44–1.00)
GFR calc Af Amer: >60 mL/min (ref >60)
GFR calc non Af Amer: >60 mL/min (ref >60)
Glucose, Bld: 89 mg/dL (ref 70–99)
Potassium: 3.9 mmol/L (ref 3.5–5.1)
Sodium: 141 mmol/L (ref 135–145)
Total Bilirubin: 0.7 mg/dL (ref 0.3–1.2)
Total Protein: 5.6 g/dL — ABNORMAL LOW (ref 6.5–8.1)

## 2018-07-11 LAB — CBC
HCT: 39.1 % (ref 36.0–46.0)
Hemoglobin: 12.9 g/dL (ref 12.0–15.0)
MCH: 34.5 pg — ABNORMAL HIGH (ref 26.0–34.0)
MCHC: 33 g/dL (ref 30.0–36.0)
MCV: 104.5 fL — ABNORMAL HIGH (ref 80.0–100.0)
Platelets: 146 10*3/uL — ABNORMAL LOW (ref 150–400)
RBC: 3.74 MIL/uL — ABNORMAL LOW (ref 3.87–5.11)
RDW: 13.2 % (ref 11.5–15.5)
WBC: 4.7 10*3/uL (ref 4.0–10.5)
nRBC: 0 % (ref 0.0–0.2)

## 2018-07-11 LAB — TSH: TSH: 13.945 u[IU]/mL — ABNORMAL HIGH (ref 0.350–4.500)

## 2018-07-11 LAB — T4, FREE: Free T4: 0.78 ng/dL — ABNORMAL LOW (ref 0.82–1.77)

## 2018-07-31 ENCOUNTER — Other Ambulatory Visit: Payer: Self-pay | Admitting: Adult Health

## 2018-07-31 ENCOUNTER — Non-Acute Institutional Stay (SKILLED_NURSING_FACILITY): Payer: Medicare HMO | Admitting: Adult Health

## 2018-07-31 ENCOUNTER — Encounter: Payer: Self-pay | Admitting: Adult Health

## 2018-07-31 DIAGNOSIS — F419 Anxiety disorder, unspecified: Secondary | ICD-10-CM | POA: Diagnosis not present

## 2018-07-31 DIAGNOSIS — F0391 Unspecified dementia with behavioral disturbance: Secondary | ICD-10-CM

## 2018-07-31 DIAGNOSIS — F03918 Unspecified dementia, unspecified severity, with other behavioral disturbance: Secondary | ICD-10-CM

## 2018-07-31 MED ORDER — ALPRAZOLAM 0.25 MG PO TABS: 0.2500 mg | ORAL_TABLET | Freq: Every day | ORAL | 0 refills | Status: DC

## 2018-07-31 NOTE — Progress Notes (Signed)
Location:   [place] Room Number: [place] of Service:  SNF (31)   CODE STATUS: DNR  Allergies  Allergen Reactions  . Levofloxacin     Chief Complaint  Patient presents with  . Acute Visit    Wandering    HPI:  Staff reports that for the past couple of days; she has had increased wandering with shopping. She is unable to be deterred by staff members. She is not physically aggressive with staff members. There are no reports of insomnia present. No reports of fevers.   Past Medical History:  Diagnosis Date  . Alcohol abuse 02/07/2018  . Atrial fibrillation ([place])   . CHF (congestive heart failure) ([place]) 02/07/2018  . Dementia ([place]) 02/07/2018  . GERD (gastroesophageal reflux disease)   . High cholesterol   . Hypertension   . Lung cancer ([place])   . Psychosis ([place]) 04/08/2018  . Seizures ([place]) 02/07/2018  . Thyroid disease   . TIA (transient ischemic attack)     Past Surgical History:  Procedure Laterality Date  . heart stent    . LOBECTOMY    . REPLACEMENT TOTAL KNEE Bilateral     Social History   Socioeconomic History  . Marital status: Widowed    Spouse name: Not on file  . Number of children: Not on file  . Years of education: Not on file  . Highest education level: Not on file  Occupational History  . Not on file  Social Needs  . Financial resource strain: Not on file  . Food insecurity:    Worry: Not on file    Inability: Not on file  . Transportation needs:    Medical: Not on file    Non-medical: Not on file  Tobacco Use  . Smoking status: Never Smoker  . Smokeless tobacco: Never Used  Substance and Sexual Activity  . Alcohol use: Not Currently  . Drug use: No  . Sexual activity: Not Currently  Lifestyle  . Physical activity:    Days per week: Not on file    Minutes per session: Not on file  . Stress: Not on file  Relationships  . Social connections:    Talks on phone: Not on file    Gets together: Not on  file    Attends religious service: Not on file    Active member of club or organization: Not on file    Attends meetings of clubs or organizations: Not on file    Relationship status: Not on file  . Intimate partner violence:    Fear of current or ex partner: Not on file    Emotionally abused: Not on file    Physically abused: Not on file    Forced sexual activity: Not on file  Other Topics Concern  . Not on file  Social History Narrative  . Not on file   History reviewed. No pertinent family history.    VITAL SIGNS BP 120/60   Pulse 78   Temp 98.5 F (36.9 C)   Resp 18   Ht 5\' 2"  (1.575 m)   Wt 97 lb 6.4 oz (44.2 kg)   BMI 17.81 kg/m   Outpatient Encounter Medications as of 07/31/2018  Medication Sig  . ALPRAZolam (XANAX) 0.25 MG tablet Take 1 tablet (0.25 mg total) by mouth at bedtime.  . busPIRone (BUSPAR) 5 MG tablet Take 5 mg by mouth 2 (two) times daily.  Marland Kitchen levothyroxine (SYNTHROID, LEVOTHROID) 50 MCG tablet Take  50 mcg by mouth daily before breakfast.  . NON FORMULARY Diet Type:  Finely chopped, NAS  . QUEtiapine (SEROQUEL) 25 MG tablet Take 12.5 mg by mouth 2 (two) times daily.  Marland Kitchen UNABLE TO FIND Apply xerofoam guaze to skin tear to left forearm and cover with a dry dressing.  Check daily and change every 3 days and as needed   No facility-administered encounter medications on file as of 07/31/2018.      SIGNIFICANT DIAGNOSTIC EXAMS  LABS REVIEWED PREVIOUS;   03-17-18: wbc 5.7; hgb 12.4; hct 38.8; mcv 108.1; plt 224; glucose 85; bun 13; creat 0.91; k+ 3.3; na++ 138; ca 8.7 protein 5.7 albumin 3.1 hgb 4.8 03-20-18: glucose 74; bun 14; creat 0.81; k+ 3.5; na++ 137; ca 9.2 04-19-28: tsh 93.852 05-18-18: tsh 31.786 06-29-18: vit B 12: 308; tsh 14.789  TODAY:   07-11-18: wbc 4.7; hgb 12.9; hct 39.1; mcv 104.5; plt 146 ; glucose 89; bun 24; creat 0.74; k+ 3.9; na++ 141; ca 9.0 liver normal albumin 3.2 tsh 13.945 free T4: 0.78   Review of Systems  Unable to perform  ROS: Dementia (unable to participate )   Physical Exam Constitutional:      General: She is not in acute distress.    Appearance: She is underweight. She is not diaphoretic.  Neck:     Musculoskeletal: Neck supple.     Thyroid: No thyromegaly.  Cardiovascular:     Rate and Rhythm: Normal rate and regular rhythm.     Pulses: Normal pulses.     Heart sounds: Normal heart sounds.     Comments: History of heart stent Pulmonary:     Effort: Pulmonary effort is normal. No respiratory distress.     Breath sounds: Normal breath sounds.     Comments:  History of lobectomy  Abdominal:     General: Bowel sounds are normal. There is no distension.     Palpations: Abdomen is soft.     Tenderness: There is no abdominal tenderness.  Musculoskeletal:     Right lower leg: No edema.     Left lower leg: No edema.     Comments:  Is able to move all extremities  History of bilateral knee replacements      Lymphadenopathy:     Cervical: No cervical adenopathy.  Skin:    General: Skin is warm and dry.  Neurological:     Mental Status: She is alert. Mental status is at baseline.  Psychiatric:        Mood and Affect: Mood normal.      ASSESSMENT/ PLAN:  TODAY;   1. Dementia with behavioral disturbance unspecified dementia type 2. Psychosis in elderly with behavioral disturbance 3. Chronic anxiety.   Will check tsh free t3 and free t4 Will change seroquel to 25 mg in the AM and 12.5 mg in the PM  Will monitor her status.     MD is aware of resident's narcotic use and is in agreement with current plan of care. We will attempt to wean resident as apropriate   Ok Edwards NP [facility]  Contact [phone] Monday through Friday 8am- 5pm  After hours call [phone]

## 2018-08-01 ENCOUNTER — Encounter (HOSPITAL_COMMUNITY)
Admission: RE | Admit: 2018-08-01 | Discharge: 2018-08-01 | Disposition: A | Discharge status: Home / Self Care | Payer: Medicare HMO | Source: Skilled Nursing Facility | Attending: Adult Health | Admitting: Adult Health

## 2018-08-01 DIAGNOSIS — M6281 Muscle weakness (generalized): Secondary | ICD-10-CM | POA: Insufficient documentation

## 2018-08-01 DIAGNOSIS — Z741 Need for assistance with personal care: Secondary | ICD-10-CM | POA: Diagnosis present

## 2018-08-01 DIAGNOSIS — Z9181 History of falling: Secondary | ICD-10-CM | POA: Diagnosis present

## 2018-08-01 DIAGNOSIS — F0391 Unspecified dementia with behavioral disturbance: Secondary | ICD-10-CM | POA: Diagnosis present

## 2018-08-01 DIAGNOSIS — R569 Unspecified convulsions: Secondary | ICD-10-CM | POA: Diagnosis present

## 2018-08-01 DIAGNOSIS — R279 Unspecified lack of coordination: Secondary | ICD-10-CM | POA: Diagnosis present

## 2018-08-01 DIAGNOSIS — E039 Hypothyroidism, unspecified: Secondary | ICD-10-CM | POA: Diagnosis present

## 2018-08-01 LAB — T4, FREE: Free T4: 1.13 ng/dL — ABNORMAL HIGH (ref 0.61–1.12)

## 2018-08-01 LAB — TSH: TSH: 6.297 u[IU]/mL — ABNORMAL HIGH (ref 0.350–4.500)

## 2018-08-02 LAB — T3, FREE: T3, Free: 2.2 pg/mL (ref 2.0–4.4)

## 2018-08-07 ENCOUNTER — Encounter: Payer: Self-pay | Admitting: Adult Health

## 2018-08-07 ENCOUNTER — Non-Acute Institutional Stay (SKILLED_NURSING_FACILITY): Payer: Medicare HMO | Admitting: Adult Health

## 2018-08-07 DIAGNOSIS — F0391 Unspecified dementia with behavioral disturbance: Secondary | ICD-10-CM

## 2018-08-07 DIAGNOSIS — F03918 Unspecified dementia, unspecified severity, with other behavioral disturbance: Secondary | ICD-10-CM

## 2018-08-07 DIAGNOSIS — R569 Unspecified convulsions: Secondary | ICD-10-CM | POA: Diagnosis not present

## 2018-08-07 NOTE — Progress Notes (Signed)
Location:   [facility] Room Number: [place] of Service:  SNF (31)   CODE STATUS: DNR  Allergies  Allergen Reactions  . Levofloxacin     Chief Complaint  Patient presents with  . Medical Management of Chronic Issues    unspecified dementia type with behavioral disturbance: Seizure:Psychosis in elderly with behavioral disturbance    HPI:  She is a 85 year old long term resident of this facility being seen for the management of her chronic illnesses:dementia; psychosis; seizure. She has recently had her seroquel increased with less episodes of shopping present. There are no reports of agitation; no reports of insomnia; no reports of uncontrolled pain. There are no reports of fevers.   Past Medical History:  Diagnosis Date  . Alcohol abuse 02/07/2018  . Atrial fibrillation ([place])   . CHF (congestive heart failure) ([place]) 02/07/2018  . Dementia ([place]) 02/07/2018  . GERD (gastroesophageal reflux disease)   . High cholesterol   . Hypertension   . Lung cancer ([place])   . Psychosis ([place]) 04/08/2018  . Seizures ([place]) 02/07/2018  . Thyroid disease   . TIA (transient ischemic attack)     Past Surgical History:  Procedure Laterality Date  . heart stent    . LOBECTOMY    . REPLACEMENT TOTAL KNEE Bilateral     Social History   Socioeconomic History  . Marital status: Widowed    Spouse name: Not on file  . Number of children: Not on file  . Years of education: Not on file  . Highest education level: Not on file  Occupational History  . Not on file  Social Needs  . Financial resource strain: Not on file  . Food insecurity    Worry: Not on file    Inability: Not on file  . Transportation needs    Medical: Not on file    Non-medical: Not on file  Tobacco Use  . Smoking status: Never Smoker  . Smokeless tobacco: Never Used  Substance and Sexual Activity  . Alcohol use: Not Currently  . Drug use: No  . Sexual activity: Not Currently   Lifestyle  . Physical activity    Days per week: Not on file    Minutes per session: Not on file  . Stress: Not on file  Relationships  . Social Herbalist on phone: Not on file    Gets together: Not on file    Attends religious service: Not on file    Active member of club or organization: Not on file    Attends meetings of clubs or organizations: Not on file    Relationship status: Not on file  . Intimate partner violence    Fear of current or ex partner: Not on file    Emotionally abused: Not on file    Physically abused: Not on file    Forced sexual activity: Not on file  Other Topics Concern  . Not on file  Social History Narrative  . Not on file   History reviewed. No pertinent family history.    VITAL SIGNS BP 120/60   Pulse 78   Temp 98 F (36.7 C)   Resp 18   Ht 5\' 2"  (1.575 m)   Wt 97 lb 6.4 oz (44.2 kg)   BMI 17.81 kg/m   Outpatient Encounter Medications as of 08/07/2018  Medication Sig  . ALPRAZolam (XANAX) 0.25 MG tablet Take 1 tablet (0.25 mg total) by mouth  at bedtime.  . busPIRone (BUSPAR) 5 MG tablet Take 5 mg by mouth 2 (two) times daily.  Marland Kitchen levothyroxine (SYNTHROID, LEVOTHROID) 50 MCG tablet Take 50 mcg by mouth daily before breakfast.  . NON FORMULARY Diet Type:  Finely chopped, NAS  . QUEtiapine (SEROQUEL) 25 MG tablet Take 25 mg by mouth daily. At 9:00 am  . QUEtiapine Fumarate (SEROQUEL PO) Take 12.5 mg by mouth at bedtime.  Marland Kitchen UNABLE TO FIND Apply xerofoam guaze to skin tear to left forearm and cover with a dry dressing.  Check daily and change every 3 days and as needed   No facility-administered encounter medications on file as of 08/07/2018.      SIGNIFICANT DIAGNOSTIC EXAMS  LABS REVIEWED PREVIOUS;   03-17-18: wbc 5.7; hgb 12.4; hct 38.8; mcv 108.1; plt 224; glucose 85; bun 13; creat 0.91; k+ 3.3; na++ 138; ca 8.7 protein 5.7 albumin 3.1 hgb 4.8 03-20-18: glucose 74; bun 14; creat 0.81; k+ 3.5; na++ 137; ca 9.2 04-19-28: tsh  93.852 05-18-18: tsh 31.786 06-29-18: vit B 12: 308; tsh 14.789 07-11-18: wbc 4.7; hgb 12.9; hct 39.1; mcv 104.5; plt 146 ; glucose 89; bun 24; creat 0.74; k+ 3.9; na++ 141; ca 9.0 liver normal albumin 3.2 tsh 13.945 free T4: 0.78  TODAY;   08-01-18: tsh 6.297 free T3; 2.2 free T4: 1.13   Review of Systems  Unable to perform ROS: Dementia (unable to participate )   Physical Exam Constitutional:      General: She is not in acute distress.    Appearance: She is underweight. She is not diaphoretic.  Neck:     Musculoskeletal: Neck supple.     Thyroid: No thyromegaly.  Cardiovascular:     Rate and Rhythm: Normal rate and regular rhythm.     Pulses: Normal pulses.     Heart sounds: Normal heart sounds.     Comments: History of heart stent Pulmonary:     Effort: Pulmonary effort is normal. No respiratory distress.     Breath sounds: Normal breath sounds.     Comments: History of lobectomy  Abdominal:     General: Bowel sounds are normal. There is no distension.     Palpations: Abdomen is soft.     Tenderness: There is no abdominal tenderness.  Musculoskeletal:     Right lower leg: No edema.     Left lower leg: No edema.     Comments:  Is able to move all extremities  History of bilateral knee replacements       Lymphadenopathy:     Cervical: No cervical adenopathy.  Skin:    General: Skin is warm and dry.  Neurological:     Mental Status: She is alert. Mental status is at baseline.  Psychiatric:        Mood and Affect: Mood normal.       ASSESSMENT/ PLAN:  TODAY;   1.unspecified dementia type with behavioral disturbance: is without change weight is stable at 97 (previous 98) pounds; the focus of her care is comfort only will monitor   2. Seizure: no reports of seizure activity will monitor  3. Psychosis in elderly with behavioral disturbance: has had less episodes of shopping; will continue seroquel 25 mg in the AM and 12.5 mg in the PM.   PREVIOUS  4. Chronic  anxiety: is stable will continue xanax 0.25 mg nightly and buspar 5 mg twice daily   5. Acquired hypothyroidism is stable  tsh 6.297 (free t3: 2.2 free  t4: 1.13)  will continue synthroid 50 mcg daily will monitor   6. Unspecified CHF (congestive heart failure) is stable will continue to monitor her status.   7. Atrial fibrillation: heart rate is stable will continue to monitor   Will give Td and pneumovax    MD is aware of resident's narcotic use and is in agreement with current plan of care. We will attempt to wean resident as apropriate   Ok Edwards NP [facility]  Contact [phone] Monday through Friday 8am- 5pm  After hours call [phone]

## 2018-08-23 ENCOUNTER — Non-Acute Institutional Stay (SKILLED_NURSING_FACILITY): Payer: Medicare HMO | Admitting: Adult Health

## 2018-08-23 ENCOUNTER — Encounter: Payer: Self-pay | Admitting: Adult Health

## 2018-08-23 DIAGNOSIS — F03918 Unspecified dementia, unspecified severity, with other behavioral disturbance: Secondary | ICD-10-CM

## 2018-08-23 DIAGNOSIS — I509 Heart failure, unspecified: Secondary | ICD-10-CM

## 2018-08-23 DIAGNOSIS — F0391 Unspecified dementia with behavioral disturbance: Secondary | ICD-10-CM

## 2018-08-23 NOTE — Progress Notes (Signed)
Location:    [facility] Room Number: 140/W Place of Service:  SNF (31)   CODE STATUS: DNR  Allergies  Allergen Reactions  . Levofloxacin     Chief Complaint  Patient presents with  . Acute Visit    Care Plan Meeting    HPI:  We have come together for her care plan meeting. She has family present via phone. Unable to do BIMS; mood 3/30. She does wander into others rooms and will take things. There are no reports of agitation or anxiety. Her weight is stable; no recent falls. We have reviewed her medication list with her family; they have verbalized understanding. There are no reports of uncontrolled pain.   Past Medical History:  Diagnosis Date  . Alcohol abuse 02/07/2018  . Atrial fibrillation ([place])   . CHF (congestive heart failure) ([place]) 02/07/2018  . Dementia ([place]) 02/07/2018  . GERD (gastroesophageal reflux disease)   . High cholesterol   . Hypertension   . Lung cancer ([place])   . Psychosis ([place]) 04/08/2018  . Seizures ([place]) 02/07/2018  . Thyroid disease   . TIA (transient ischemic attack)     Past Surgical History:  Procedure Laterality Date  . heart stent    . LOBECTOMY    . REPLACEMENT TOTAL KNEE Bilateral     Social History   Socioeconomic History  . Marital status: Widowed    Spouse name: Not on file  . Number of children: Not on file  . Years of education: Not on file  . Highest education level: Not on file  Occupational History  . Not on file  Social Needs  . Financial resource strain: Not on file  . Food insecurity    Worry: Not on file    Inability: Not on file  . Transportation needs    Medical: Not on file    Non-medical: Not on file  Tobacco Use  . Smoking status: Never Smoker  . Smokeless tobacco: Never Used  Substance and Sexual Activity  . Alcohol use: Not Currently  . Drug use: No  . Sexual activity: Not Currently  Lifestyle  . Physical activity    Days per week: Not on file    Minutes per session: Not  on file  . Stress: Not on file  Relationships  . Social Herbalist on phone: Not on file    Gets together: Not on file    Attends religious service: Not on file    Active member of club or organization: Not on file    Attends meetings of clubs or organizations: Not on file    Relationship status: Not on file  . Intimate partner violence    Fear of current or ex partner: Not on file    Emotionally abused: Not on file    Physically abused: Not on file    Forced sexual activity: Not on file  Other Topics Concern  . Not on file  Social History Narrative  . Not on file   History reviewed. No pertinent family history.    VITAL SIGNS BP 115/62   Pulse 68   Temp (!) 97.5 F (36.4 C) (Oral)   Resp 18   Ht 5\' 2"  (1.575 m)   Wt 97 lb 6.4 oz (44.2 kg)   BMI 17.81 kg/m   Outpatient Encounter Medications as of 08/23/2018  Medication Sig  . ALPRAZolam (XANAX) 0.25 MG tablet Take 1 tablet (0.25 mg total) by mouth at bedtime.  Marland Kitchen  busPIRone (BUSPAR) 5 MG tablet Take 5 mg by mouth 2 (two) times daily.  Marland Kitchen levothyroxine (SYNTHROID, LEVOTHROID) 50 MCG tablet Take 50 mcg by mouth daily before breakfast.  . NON FORMULARY Diet Type:  Finely chopped, NAS  . QUEtiapine (SEROQUEL) 25 MG tablet Take 25 mg by mouth daily. At 9:00 am  . QUEtiapine Fumarate (SEROQUEL PO) Take 12.5 mg by mouth at bedtime.  . [DISCONTINUED] UNABLE TO FIND Apply xerofoam guaze to skin tear to left forearm and cover with a dry dressing.  Check daily and change every 3 days and as needed   No facility-administered encounter medications on file as of 08/23/2018.      SIGNIFICANT DIAGNOSTIC EXAMS   LABS REVIEWED PREVIOUS;   03-17-18: wbc 5.7; hgb 12.4; hct 38.8; mcv 108.1; plt 224; glucose 85; bun 13; creat 0.91; k+ 3.3; na++ 138; ca 8.7 protein 5.7 albumin 3.1 hgb 4.8 03-20-18: glucose 74; bun 14; creat 0.81; k+ 3.5; na++ 137; ca 9.2 04-19-28: tsh 93.852 05-18-18: tsh 31.786 06-29-18: vit B 12: 308; tsh 14.789  07-11-18: wbc 4.7; hgb 12.9; hct 39.1; mcv 104.5; plt 146 ; glucose 89; bun 24; creat 0.74; k+ 3.9; na++ 141; ca 9.0 liver normal albumin 3.2 tsh 13.945 free T4: 0.78 08-01-18: tsh 6.297 free T3; 2.2 free T4: 1.13  NO NEW LABS.   Review of Systems  Unable to perform ROS: Dementia (unable to participate )     Physical Exam Constitutional:      General: She is not in acute distress.    Appearance: She is underweight. She is not diaphoretic.  Neck:     Musculoskeletal: Neck supple.     Thyroid: No thyromegaly.  Cardiovascular:     Rate and Rhythm: Normal rate and regular rhythm.     Pulses: Normal pulses.     Heart sounds: Normal heart sounds.     Comments: History of heart stent  Pulmonary:     Effort: Pulmonary effort is normal. No respiratory distress.     Breath sounds: Normal breath sounds.     Comments: History of lobectomy  Abdominal:     General: Bowel sounds are normal. There is no distension.     Palpations: Abdomen is soft.     Tenderness: There is no abdominal tenderness.  Musculoskeletal:     Right lower leg: No edema.     Left lower leg: No edema.     Comments:  Is able to move all extremities  History of bilateral knee replacements        Lymphadenopathy:     Cervical: No cervical adenopathy.  Skin:    General: Skin is warm and dry.  Neurological:     Mental Status: She is alert. Mental status is at baseline.  Psychiatric:        Mood and Affect: Mood normal.       ASSESSMENT/ PLAN:  TODAY:   1. Dementia with behavioral disturbance unspecified dementia type  2. Psychosis in elderly with behavioral disturbance  3. Chronic congestive heart failure unspecified heart failure type  Will continue current medications Will continue current plan of care Will continue to monitor her status.       MD is aware of resident's narcotic use and is in agreement with current plan of care. We will attempt to wean resident as apropriate   Ok Edwards NP  [facility]  Contact [phone] Monday through Friday 8am- 5pm  After hours call [phone]

## 2018-09-07 ENCOUNTER — Encounter: Payer: Self-pay | Admitting: Adult Health

## 2018-09-07 ENCOUNTER — Non-Acute Institutional Stay (SKILLED_NURSING_FACILITY): Payer: Medicare HMO | Admitting: Adult Health

## 2018-09-07 DIAGNOSIS — E039 Hypothyroidism, unspecified: Secondary | ICD-10-CM

## 2018-09-07 DIAGNOSIS — F419 Anxiety disorder, unspecified: Secondary | ICD-10-CM | POA: Diagnosis not present

## 2018-09-07 DIAGNOSIS — I509 Heart failure, unspecified: Secondary | ICD-10-CM

## 2018-09-07 NOTE — Progress Notes (Signed)
Location:   [facility] Room Number: [place] of Service:  SNF (31)   CODE STATUS: DNR  Allergies  Allergen Reactions  . Levofloxacin     Chief Complaint  Patient presents with  . Medical Management of Chronic Issues        Chronic anxiety:  Acquired hypothyroidism:Unspecified CHF (congestive heart failure)     HPI:  She is a 85 year old long term resident of this facility being seen for the management of her chronic illnesses: anxiety; hypothyroidism; chf. There are no reports of changes in her appetite; no uncontrolled pain. She does continue to wander around unit. Her weight is stable.   Past Medical History:  Diagnosis Date  . Alcohol abuse 02/07/2018  . Atrial fibrillation ([place])   . CHF (congestive heart failure) ([place]) 02/07/2018  . Dementia ([place]) 02/07/2018  . GERD (gastroesophageal reflux disease)   . High cholesterol   . Hypertension   . Lung cancer ([place])   . Psychosis ([place]) 04/08/2018  . Seizures ([place]) 02/07/2018  . Thyroid disease   . TIA (transient ischemic attack)     Past Surgical History:  Procedure Laterality Date  . heart stent    . LOBECTOMY    . REPLACEMENT TOTAL KNEE Bilateral     Social History   Socioeconomic History  . Marital status: Widowed    Spouse name: Not on file  . Number of children: Not on file  . Years of education: Not on file  . Highest education level: Not on file  Occupational History  . Not on file  Social Needs  . Financial resource strain: Not on file  . Food insecurity    Worry: Not on file    Inability: Not on file  . Transportation needs    Medical: Not on file    Non-medical: Not on file  Tobacco Use  . Smoking status: Never Smoker  . Smokeless tobacco: Never Used  Substance and Sexual Activity  . Alcohol use: Not Currently  . Drug use: No  . Sexual activity: Not Currently  Lifestyle  . Physical activity    Days per week: Not on file    Minutes per session: Not on file   . Stress: Not on file  Relationships  . Social Herbalist on phone: Not on file    Gets together: Not on file    Attends religious service: Not on file    Active member of club or organization: Not on file    Attends meetings of clubs or organizations: Not on file    Relationship status: Not on file  . Intimate partner violence    Fear of current or ex partner: Not on file    Emotionally abused: Not on file    Physically abused: Not on file    Forced sexual activity: Not on file  Other Topics Concern  . Not on file  Social History Narrative  . Not on file   History reviewed. No pertinent family history.    VITAL SIGNS BP 120/65   Pulse 60   Temp (!) 96 F (35.6 C)   Ht 5\' 2"  (1.575 m)   Wt 97 lb (44 kg)   BMI 17.74 kg/m   Outpatient Encounter Medications as of 09/07/2018  Medication Sig  . ALPRAZolam (XANAX) 0.25 MG tablet Take 1 tablet (0.25 mg total) by mouth at bedtime.  . busPIRone (BUSPAR) 5 MG tablet Take 5 mg by  mouth 2 (two) times daily.  Marland Kitchen levothyroxine (SYNTHROID, LEVOTHROID) 50 MCG tablet Take 50 mcg by mouth daily before breakfast.  . NON FORMULARY Diet Type:  Finely chopped, NAS  . QUEtiapine (SEROQUEL) 25 MG tablet Take 25 mg by mouth daily. At 9:00 am  . QUEtiapine Fumarate (SEROQUEL PO) Take 12.5 mg by mouth at bedtime.   No facility-administered encounter medications on file as of 09/07/2018.      SIGNIFICANT DIAGNOSTIC EXAMS  LABS REVIEWED PREVIOUS;   03-17-18: wbc 5.7; hgb 12.4; hct 38.8; mcv 108.1; plt 224; glucose 85; bun 13; creat 0.91; k+ 3.3; na++ 138; ca 8.7 protein 5.7 albumin 3.1 hgb 4.8 03-20-18: glucose 74; bun 14; creat 0.81; k+ 3.5; na++ 137; ca 9.2 04-19-28: tsh 93.852 05-18-18: tsh 31.786 06-29-18: vit B 12: 308; tsh 14.789 07-11-18: wbc 4.7; hgb 12.9; hct 39.1; mcv 104.5; plt 146 ; glucose 89; bun 24; creat 0.74; k+ 3.9; na++ 141; ca 9.0 liver normal albumin 3.2 tsh 13.945 free T4: 0.78 08-01-18: tsh 6.297 free T3; 2.2 free T4:  1.13  NO NEW LABS.    Review of Systems  Unable to perform ROS: Dementia (unable to participate )     Physical Exam Constitutional:      General: She is not in acute distress.    Appearance: She is underweight. She is not diaphoretic.  Neck:     Musculoskeletal: Neck supple.     Thyroid: No thyromegaly.  Cardiovascular:     Rate and Rhythm: Normal rate and regular rhythm.     Pulses: Normal pulses.     Heart sounds: Normal heart sounds.     Comments: History of heart stent Pulmonary:     Effort: Pulmonary effort is normal. No respiratory distress.     Breath sounds: Normal breath sounds.     Comments: History of lobectomy  Abdominal:     General: Bowel sounds are normal. There is no distension.     Palpations: Abdomen is soft.     Tenderness: There is no abdominal tenderness.  Musculoskeletal:     Right lower leg: No edema.     Left lower leg: No edema.     Comments:  Is able to move all extremities  History of bilateral knee replacements         Lymphadenopathy:     Cervical: No cervical adenopathy.  Skin:    General: Skin is warm and dry.  Neurological:     Mental Status: She is alert. Mental status is at baseline.  Psychiatric:        Mood and Affect: Mood normal.     ASSESSMENT/ PLAN:  TODAY;   1. Chronic anxiety: is stable will continue xanax 0.25 mg nightly and buspar 5 mg twice daily   2. Acquired hypothyroidism: is stable tsh 6.297 ( free t3: 2.2 free t4: 1.13) will continue synthroid 50 mcg daily and will monitor  3. Unspecified CHF (congestive heart failure) is compensated; will continue to monitor her status.   PREVIOUS  4. Atrial fibrillation: heart rate is stable will continue to monitor   5.unspecified dementia type with behavioral disturbance: is without change weight is stable at 97 (previous 98) pounds; the focus of her care is comfort only will monitor   6. Seizure: no reports of seizure activity will monitor  7. Psychosis in elderly  with behavioral disturbance: has had less episodes of shopping; will continue seroquel 25 mg in the AM and 12.5 mg in the PM.  MD is aware of resident's narcotic use and is in agreement with current plan of care. We will attempt to wean resident as apropriate   Ok Edwards NP [facility]  Contact [phone] Monday through Friday 8am- 5pm  After hours call [phone]

## 2018-09-21 ENCOUNTER — Other Ambulatory Visit: Payer: Self-pay | Admitting: Adult Health

## 2018-09-21 MED ORDER — ALPRAZOLAM 0.25 MG PO TABS: 0.2500 mg | ORAL_TABLET | Freq: Every day | ORAL | 0 refills | Status: DC

## 2018-10-03 ENCOUNTER — Inpatient Hospital Stay (HOSPITAL_COMMUNITY)
Admission: EM | Admit: 2018-10-03 | Discharge: 2018-10-10 | DRG: 470 | Disposition: A | Discharge status: Skilled Nursing Facility | Payer: Medicare HMO | Source: Skilled Nursing Facility | Attending: Internal Medicine | Admitting: Internal Medicine

## 2018-10-03 ENCOUNTER — Encounter (HOSPITAL_COMMUNITY): Payer: Self-pay | Admitting: Emergency Medicine

## 2018-10-03 ENCOUNTER — Emergency Department (HOSPITAL_COMMUNITY): Payer: Medicare HMO

## 2018-10-03 ENCOUNTER — Inpatient Hospital Stay (HOSPITAL_COMMUNITY): Payer: Medicare HMO

## 2018-10-03 ENCOUNTER — Other Ambulatory Visit: Payer: Self-pay

## 2018-10-03 DIAGNOSIS — Z515 Encounter for palliative care: Secondary | ICD-10-CM | POA: Diagnosis not present

## 2018-10-03 DIAGNOSIS — I509 Heart failure, unspecified: Secondary | ICD-10-CM | POA: Diagnosis present

## 2018-10-03 DIAGNOSIS — E039 Hypothyroidism, unspecified: Secondary | ICD-10-CM | POA: Diagnosis present

## 2018-10-03 DIAGNOSIS — Z993 Dependence on wheelchair: Secondary | ICD-10-CM | POA: Diagnosis not present

## 2018-10-03 DIAGNOSIS — D696 Thrombocytopenia, unspecified: Secondary | ICD-10-CM | POA: Diagnosis present

## 2018-10-03 DIAGNOSIS — W1830XA Fall on same level, unspecified, initial encounter: Secondary | ICD-10-CM | POA: Diagnosis present

## 2018-10-03 DIAGNOSIS — E7800 Pure hypercholesterolemia, unspecified: Secondary | ICD-10-CM | POA: Diagnosis present

## 2018-10-03 DIAGNOSIS — Z96653 Presence of artificial knee joint, bilateral: Secondary | ICD-10-CM | POA: Diagnosis present

## 2018-10-03 DIAGNOSIS — Z8673 Personal history of transient ischemic attack (TIA), and cerebral infarction without residual deficits: Secondary | ICD-10-CM

## 2018-10-03 DIAGNOSIS — F0390 Unspecified dementia without behavioral disturbance: Secondary | ICD-10-CM | POA: Diagnosis present

## 2018-10-03 DIAGNOSIS — Z79890 Hormone replacement therapy (postmenopausal): Secondary | ICD-10-CM | POA: Diagnosis not present

## 2018-10-03 DIAGNOSIS — Z7189 Other specified counseling: Secondary | ICD-10-CM

## 2018-10-03 DIAGNOSIS — F03C0 Unspecified dementia, severe, without behavioral disturbance, psychotic disturbance, mood disturbance, and anxiety: Secondary | ICD-10-CM

## 2018-10-03 DIAGNOSIS — Z96649 Presence of unspecified artificial hip joint: Secondary | ICD-10-CM

## 2018-10-03 DIAGNOSIS — Z66 Do not resuscitate: Secondary | ICD-10-CM | POA: Diagnosis present

## 2018-10-03 DIAGNOSIS — M25552 Pain in left hip: Secondary | ICD-10-CM | POA: Diagnosis present

## 2018-10-03 DIAGNOSIS — Z955 Presence of coronary angioplasty implant and graft: Secondary | ICD-10-CM | POA: Diagnosis not present

## 2018-10-03 DIAGNOSIS — S72002A Fracture of unspecified part of neck of left femur, initial encounter for closed fracture: Secondary | ICD-10-CM

## 2018-10-03 DIAGNOSIS — E785 Hyperlipidemia, unspecified: Secondary | ICD-10-CM | POA: Diagnosis present

## 2018-10-03 DIAGNOSIS — K219 Gastro-esophageal reflux disease without esophagitis: Secondary | ICD-10-CM | POA: Diagnosis present

## 2018-10-03 DIAGNOSIS — Z20828 Contact with and (suspected) exposure to other viral communicable diseases: Secondary | ICD-10-CM | POA: Diagnosis present

## 2018-10-03 DIAGNOSIS — D62 Acute posthemorrhagic anemia: Secondary | ICD-10-CM | POA: Diagnosis not present

## 2018-10-03 DIAGNOSIS — R5381 Other malaise: Secondary | ICD-10-CM | POA: Diagnosis present

## 2018-10-03 DIAGNOSIS — S72032A Displaced midcervical fracture of left femur, initial encounter for closed fracture: Principal | ICD-10-CM | POA: Diagnosis present

## 2018-10-03 DIAGNOSIS — Z85118 Personal history of other malignant neoplasm of bronchus and lung: Secondary | ICD-10-CM | POA: Diagnosis not present

## 2018-10-03 DIAGNOSIS — Z7401 Bed confinement status: Secondary | ICD-10-CM | POA: Diagnosis not present

## 2018-10-03 DIAGNOSIS — Y92129 Unspecified place in nursing home as the place of occurrence of the external cause: Secondary | ICD-10-CM

## 2018-10-03 DIAGNOSIS — I4891 Unspecified atrial fibrillation: Secondary | ICD-10-CM

## 2018-10-03 DIAGNOSIS — Z79899 Other long term (current) drug therapy: Secondary | ICD-10-CM | POA: Diagnosis not present

## 2018-10-03 DIAGNOSIS — Z01810 Encounter for preprocedural cardiovascular examination: Secondary | ICD-10-CM | POA: Diagnosis not present

## 2018-10-03 DIAGNOSIS — I110 Hypertensive heart disease with heart failure: Secondary | ICD-10-CM | POA: Diagnosis present

## 2018-10-03 DIAGNOSIS — Z419 Encounter for procedure for purposes other than remedying health state, unspecified: Secondary | ICD-10-CM

## 2018-10-03 DIAGNOSIS — W19XXXA Unspecified fall, initial encounter: Secondary | ICD-10-CM

## 2018-10-03 HISTORY — DX: Fracture of unspecified part of neck of left femur, initial encounter for closed fracture: S72.002A

## 2018-10-03 LAB — BASIC METABOLIC PANEL
Anion gap: 13 (ref 5–15)
BUN: 30 mg/dL — ABNORMAL HIGH (ref 8–23)
CO2: 21 mmol/L — ABNORMAL LOW (ref 22–32)
Calcium: 9.6 mg/dL (ref 8.9–10.3)
Chloride: 108 mmol/L (ref 98–111)
Creatinine, Ser: 0.81 mg/dL (ref 0.44–1.00)
GFR calc Af Amer: >60 mL/min (ref >60)
GFR calc non Af Amer: >60 mL/min (ref >60)
Glucose, Bld: 124 mg/dL — ABNORMAL HIGH (ref 70–99)
Potassium: 4.3 mmol/L (ref 3.5–5.1)
Sodium: 142 mmol/L (ref 135–145)

## 2018-10-03 LAB — CBC WITH DIFFERENTIAL/PLATELET
Abs Immature Granulocytes: 0.05 10*3/uL (ref 0.00–0.07)
Basophils Absolute: 0.1 10*3/uL (ref 0.0–0.1)
Basophils Relative: 1 %
Eosinophils Absolute: 0.1 10*3/uL (ref 0.0–0.5)
Eosinophils Relative: 1 %
HCT: 48 % — ABNORMAL HIGH (ref 36.0–46.0)
Hemoglobin: 15.6 g/dL — ABNORMAL HIGH (ref 12.0–15.0)
Immature Granulocytes: 1 %
Lymphocytes Relative: 31 %
Lymphs Abs: 2.6 10*3/uL (ref 0.7–4.0)
MCH: 32.2 pg (ref 26.0–34.0)
MCHC: 32.5 g/dL (ref 30.0–36.0)
MCV: 99.2 fL (ref 80.0–100.0)
Monocytes Absolute: 0.7 10*3/uL (ref 0.1–1.0)
Monocytes Relative: 8 %
Neutro Abs: 4.9 10*3/uL (ref 1.7–7.7)
Neutrophils Relative %: 58 %
Platelets: 160 10*3/uL (ref 150–400)
RBC: 4.84 MIL/uL (ref 3.87–5.11)
RDW: 14.2 % (ref 11.5–15.5)
WBC: 8.4 10*3/uL (ref 4.0–10.5)
nRBC: 0 % (ref 0.0–0.2)

## 2018-10-03 LAB — SARS CORONAVIRUS 2 BY RT PCR (HOSPITAL ORDER, PERFORMED IN ~~LOC~~ HOSPITAL LAB): SARS Coronavirus 2: NEGATIVE

## 2018-10-03 MED ORDER — BUSPIRONE HCL 5 MG PO TABS
5.0000 mg | ORAL_TABLET | Freq: Two times a day (BID) | ORAL | Status: DC
  Administered 2018-10-03 – 2018-10-10 (×11): 5 mg via ORAL
  Filled 2018-10-03 (×12): qty 1

## 2018-10-03 MED ORDER — ALPRAZOLAM 0.25 MG PO TABS
0.2500 mg | ORAL_TABLET | Freq: Every day | ORAL | Status: DC
  Administered 2018-10-03 – 2018-10-09 (×6): 0.25 mg via ORAL
  Filled 2018-10-03 (×7): qty 1

## 2018-10-03 MED ORDER — ACETAMINOPHEN 650 MG RE SUPP: 650.0000 mg | Freq: Four times a day (QID) | RECTAL | Status: DC | PRN

## 2018-10-03 MED ORDER — LORAZEPAM 2 MG/ML IJ SOLN
0.5000 mg | Freq: Once | INTRAMUSCULAR | Status: AC
  Administered 2018-10-03: 0.5 mg via INTRAVENOUS
  Filled 2018-10-03: qty 1

## 2018-10-03 MED ORDER — ACETAMINOPHEN 325 MG PO TABS: 650.0000 mg | ORAL_TABLET | Freq: Four times a day (QID) | ORAL | Status: DC | PRN

## 2018-10-03 MED ORDER — POLYETHYLENE GLYCOL 3350 17 G PO PACK: 17.0000 g | PACK | Freq: Every day | ORAL | Status: DC | PRN

## 2018-10-03 MED ORDER — QUETIAPINE FUMARATE 25 MG PO TABS
25.0000 mg | ORAL_TABLET | Freq: Every morning | ORAL | Status: DC
  Administered 2018-10-04 – 2018-10-10 (×7): 25 mg via ORAL
  Filled 2018-10-03 (×6): qty 1

## 2018-10-03 MED ORDER — QUETIAPINE FUMARATE 25 MG PO TABS
12.5000 mg | ORAL_TABLET | Freq: Every day | ORAL | Status: DC
  Administered 2018-10-03 – 2018-10-09 (×6): 12.5 mg via ORAL
  Filled 2018-10-03 (×7): qty 1

## 2018-10-03 MED ORDER — ONDANSETRON HCL 4 MG PO TABS: 4.0000 mg | ORAL_TABLET | Freq: Four times a day (QID) | ORAL | Status: DC | PRN

## 2018-10-03 MED ORDER — MORPHINE SULFATE (PF) 2 MG/ML IV SOLN
2.0000 mg | INTRAVENOUS | Status: DC | PRN
  Administered 2018-10-03 – 2018-10-04 (×2): 2 mg via INTRAVENOUS
  Filled 2018-10-03 (×2): qty 1

## 2018-10-03 MED ORDER — ONDANSETRON HCL 4 MG/2ML IJ SOLN: 4.0000 mg | Freq: Four times a day (QID) | INTRAMUSCULAR | Status: DC | PRN

## 2018-10-03 MED ORDER — LEVOTHYROXINE SODIUM 50 MCG PO TABS
50.0000 ug | ORAL_TABLET | Freq: Every day | ORAL | Status: DC
  Administered 2018-10-04 – 2018-10-10 (×6): 50 ug via ORAL
  Filled 2018-10-03 (×6): qty 1

## 2018-10-03 MED ORDER — SODIUM CHLORIDE 0.45 % IV SOLN
INTRAVENOUS | Status: AC
  Administered 2018-10-04: 08:00:00 via INTRAVENOUS

## 2018-10-03 MED ORDER — MORPHINE SULFATE (PF) 4 MG/ML IV SOLN
2.0000 mg | Freq: Once | INTRAVENOUS | Status: AC
  Administered 2018-10-03: 2 mg via INTRAVENOUS
  Filled 2018-10-03: qty 1

## 2018-10-03 MED ORDER — METOPROLOL TARTRATE 5 MG/5ML IV SOLN
5.0000 mg | INTRAVENOUS | Status: AC | PRN
  Administered 2018-10-03 – 2018-10-04 (×2): 5 mg via INTRAVENOUS
  Filled 2018-10-03 (×2): qty 5

## 2018-10-03 NOTE — ED Notes (Signed)
Pt to XR

## 2018-10-03 NOTE — H&P (Addendum)
History and Physical    Casey Wood HCW:[id number] DOB: 03/14/1922 DOA: 10/03/2018  PCP: Casey Duos, MD   Patient coming from: [place] home.  I have personally briefly reviewed patient's old medical records in [place]  Chief Complaint: FALL  HPI: Casey Wood is a 85 y.o. female with medical history significant for dementia with psychosis, atrial fibrillation, seizure, CHF, who was brought to the ED from [place] home with reports of a fall.  Daughter- Casey Wood, is present at bedside, she has not seen her mother in 1 months.  Patient is confused, unable to answer my questions, but this is her baseline.  At baseline she ambulates with a wheelchair.  ED Course: Tachycardic to 124, O2 sats greater than 94% on room air.  Unremarkable CBC, BMP.  Sars CoVID test negative.  Hip x-ray mildly comminuted foreshortened and slightly valgus angulated transcervical left femoral neck fracture, remote appearing posttraumatic deformity of the left inferior and superior pubic rami.  Portable chest x-ray without acute disease, shows chronic coarse interstitial changes, and questionable fracture of left humerus.  Review of Systems: Able to ascertain due to dementia.  Past Medical History:  Diagnosis Date  . Alcohol abuse 02/07/2018  . Atrial fibrillation ([place])   . CHF (congestive heart failure) ([place]) 02/07/2018  . Dementia ([place]) 02/07/2018  . GERD (gastroesophageal reflux disease)   . High cholesterol   . Hypertension   . Lung cancer ([place])   . Psychosis ([place]) 04/08/2018  . Seizures ([place]) 02/07/2018  . Thyroid disease   . TIA (transient ischemic attack)     Past Surgical History:  Procedure Laterality Date  . heart stent    . LOBECTOMY    . REPLACEMENT TOTAL KNEE Bilateral      reports that she has never smoked. She has never used smokeless tobacco. She reports previous alcohol use. She reports that she does not use drugs.  Allergies  Allergen  Reactions  . Levofloxacin     unknown   Unable to ascertain due to dementia.  Prior to Admission medications   Medication Sig Start Date End Date Taking? Authorizing Provider  ALPRAZolam (XANAX) 0.25 MG tablet Take 1 tablet (0.25 mg total) by mouth at bedtime. 09/21/18  Yes Gerlene Fee, NP  busPIRone (BUSPAR) 5 MG tablet Take 5 mg by mouth 2 (two) times daily.   Yes [provider]  levothyroxine (SYNTHROID, LEVOTHROID) 50 MCG tablet Take 50 mcg by mouth daily before breakfast.   Yes [provider]  QUEtiapine (SEROQUEL) 25 MG tablet Take 25 mg by mouth every morning. At 9:00 am 07/31/18  Yes [provider]  QUEtiapine Fumarate (SEROQUEL PO) Take 12.5 mg by mouth at bedtime. 07/31/18  Yes [provider]    Physical Exam: Exam limited by patient's baseline dementia. Vitals:   10/03/18 1930 10/03/18 2000 10/03/18 2113 10/03/18 2130  BP: (!) 119/104 135/88 (!) 139/101 (!) 136/101  Pulse: (!) 102 (!) 124 (!) 145 (!) 118  Resp: 18 (!) 21 18 (!) 25  Temp:      TempSrc:      SpO2: 96% 95% 98% (!) 88%  Weight:      Height:        Constitutional: NAD, calm, comfortable Vitals:   10/03/18 1930 10/03/18 2000 10/03/18 2113 10/03/18 2130  BP: (!) 119/104 135/88 (!) 139/101 (!) 136/101  Pulse: (!) 102 (!) 124 (!) 145 (!) 118  Resp: 18 (!) 21 18 (!)  25  Temp:      TempSrc:      SpO2: 96% 95% 98% (!) 88%  Weight:      Height:       Eyes: PERRL, lids and conjunctivae normal ENMT: Mucous membranes are moist. Posterior pharynx clear of any exudate or lesions.  Neck: normal, supple, no masses, no thyromegaly Respiratory: clear to auscultation bilaterally-anteriorly, no wheezing, no crackles. Normal respiratory effort. No accessory muscle use.  Cardiovascular: Tachycardic, irregular rate and rhythm, no murmurs / rubs / gallops. No extremity edema. 2+ pedal pulses. No carotid bruits.  Abdomen: Protuberance around umbilical area, without tenderness, no  masses palpated. No hepatosplenomegaly. Bowel sounds positive.  Musculoskeletal: no clubbing / cyanosis.  Foreshortened left lower extremity.  Normal muscle tone.  Skin: no rashes, lesions, ulcers. No induration Neurologic: Cranial nerves grossly intact.  Moving mostly upper extremities R > L spontaneously.  Psychiatric: Alert, able to tell me only her middle name Casey Wood, otherwise not oriented to person or place.  Speech incomprehensible.  Labs on Admission: I have personally reviewed following labs and imaging studies  CBC: Recent Labs  Lab 10/03/18 1758  WBC 8.4  NEUTROABS 4.9  HGB 15.6*  HCT 48.0*  MCV 99.2  PLT 938   Basic Metabolic Panel: Recent Labs  Lab 10/03/18 1758  NA 142  K 4.3  CL 108  CO2 21*  GLUCOSE 124*  BUN 30*  CREATININE 0.81  CALCIUM 9.6    Radiological Exams on Admission: Dg Chest 1 View  Result Date: 10/03/2018 CLINICAL DATA:  Preop, left hip fracture EXAM: CHEST  1 VIEW COMPARISON:  None. FINDINGS: Coarse interstitial changes are likely chronic. No acute consolidation. No convincing evidence of edema. No pneumothorax or effusion. The aorta is calcified and tortuous. The brachiocephalic vessels on the right are somewhat tortuous. Remaining cardiomediastinal contours are unremarkable. Question projectional irregularity versus fracture of the left humerus. Additional degenerative changes are present in the spine and shoulders. IMPRESSION: 1. No active disease.  Likely chronic coarse interstitial changes. 2. Question projectional irregularity versus fracture of the left humerus. Correlate with point tenderness and consider dedicated shoulder radiographs if there is clinical concern. Electronically Signed   By: Lovena Le M.D.   On: 10/03/2018 19:25   Dg Hip Unilat W Or Wo Pelvis 2-3 Views Left  Result Date: 10/03/2018 CLINICAL DATA:  Left hip pain, fall. Altered mental status EXAM: DG HIP (WITH OR WITHOUT PELVIS) 2-3V LEFT COMPARISON:  None. FINDINGS:  Mildly comminuted, foreshortened and slightly valgus angulated transcervical left femoral neck fracture. Femoral head remains normally located. Question remote posttraumatic deformity of the left inferior and superior pubic ramus. No additional acute osseous abnormalities are identified. Degenerative changes are present in the hips and lower lumbar spine. Bone mineralization is diminished which may limit detection of smaller nondisplaced fractures. IMPRESSION: Mildly comminuted, foreshortened and slightly valgus angulated transcervical left femoral neck fracture. Remote appearing posttraumatic deformity of the left inferior and superior pubic ramus Electronically Signed   By: Lovena Le M.D.   On: 10/03/2018 19:23    EKG: Independently reviewed.  Atrial fibrillation.  LAFB.  No old EKG to compare.  Assessment/Plan Principal Problem:   Closed displaced fracture of left femoral neck (HCC)   Left femoral neck fracture- s/p fall.  Patient ambulates on wheelchair. -Follow-up orthopedics recommendation -N.p.o. at midnight -IV morphine 2 mg PRN  Atrial fibrillation with RVR- rates up to 145.  Likely secondary to pain.  Known history of atrial fibrillation.  Not on anticoagulation or rate limiting agents. -PRN metoprolol 5mg  - Gentle hydration, hemoglobin 10.6 suggesting hemoconcentration- 1/2 N/s 75cc/hr x 12 hrs  Irregularity versus fracture of left humerus- incidental finding on chest x-ray. -Dedicated x-ray left humerus.  Dementia with psychosis-stable.  Per daughter patient is always pulling sheets of people's beds at the nursing home.  Confused at baseline.  Long-term nursing home resident -Home Seroquel, buspirone, Xanax  Unspecified CHF-appears compensated.  Not on diuretics.  No echo on file.  Seizure history-per nursing home notes, no seizures .  Not on antiseizure medications.  Hypothyroidism-  -Resume home Synthroid  Hypertension-stable. Not on antihypertensives  DVT  prophylaxis: SCDs Code Status: DO NOT RESUSCITATE universal DNR form at bedside. Family Communication: Daughter Casey Wood at bedside who is also healthcare power of attorney. Disposition Plan: Per rounding team Consults called: Orthopedics Admission status: Inpatient, telemetry I certify that at the point of admission it is my clinical judgment that the patient will require inpatient hospital care spanning beyond 2 midnights from the point of admission due to high intensity of service, high risk for further deterioration and high frequency of surveillance required. The following factors support the patient status of inpatient: Femur fracture requiring inpatient surgical procedure.   Bethena Roys MD [facility]  10/03/2018, 10:13 PM

## 2018-10-03 NOTE — ED Notes (Signed)
Pts daughter at bedside  

## 2018-10-03 NOTE — Progress Notes (Signed)
I have spoken to EDP regarding patient.  I have requested transfer to medicine service at [facility] for surgical treatment.  Based on mine and the OR availability, surgery will be either wed or thur afternoon.  Hold lovenox for now.  NPO after midnight.  I will see patient in the morning for full consult.

## 2018-10-03 NOTE — ED Triage Notes (Signed)
Pt sent over by [facility] for evaluation after a fall. Pt c/o Lt sided hip pain. Pt very confused which is her baseline. Pt also hard of hearing.

## 2018-10-03 NOTE — ED Provider Notes (Signed)
[facility] EMERGENCY DEPARTMENT Provider Note   CSN: [id number] Arrival date & time: 10/03/18  1749     History   Chief Complaint Chief Complaint  Patient presents with   Fall    HPI Casey Wood is a 85 y.o. female.     HPI   85 year old female coming from nursing facility after a fall.  Left-sided pain and deformity to left lower extremity.  Patient is apparently very confused at baseline and also hard of hearing.  Her daughter is now at bedside.  She is extremely excited to see her because she has not seen her in several months because of nursing home restrictions in place for COVID.  She states that her mother seems to be at her baseline though.  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      Home Medications    Prior to Admission medications   Medication Sig Start Date End Date Taking? Authorizing Provider  ALPRAZolam (XANAX) 0.25 MG tablet Take 1 tablet (0.25 mg total) by mouth at bedtime. 09/21/18   Gerlene Fee, NP  busPIRone (BUSPAR) 5 MG tablet Take 5 mg by mouth 2 (two) times daily.    [provider]  levothyroxine (SYNTHROID, LEVOTHROID) 50 MCG tablet Take 50 mcg by mouth daily before breakfast.    [provider]  NON FORMULARY Diet Type:  Finely chopped, NAS 02/06/18   [provider]  QUEtiapine (SEROQUEL) 25 MG tablet Take 25 mg by mouth daily. At 9:00 am 07/31/18   [provider]  QUEtiapine Fumarate (SEROQUEL PO) Take 12.5 mg by mouth at bedtime. 07/31/18   [provider]    Family History No family history on file.  Social History Social History   Tobacco Use   Smoking status: Never Smoker   Smokeless tobacco: Never Used  Substance Use Topics   Alcohol use: Not Currently   Drug use: No     Allergies   Levofloxacin   Review of Systems Review of Systems  Level 5 caveat because of dementia.  Physical Exam Updated Vital Signs BP (!) 119/104    Pulse (!) 102    Temp 98.4 F (36.9 C) (Axillary)    Resp 18    Ht 5\' 2"  (1.575 m)    Wt 44 kg    SpO2 96%    BMI 17.74 kg/m   Physical Exam Vitals signs and nursing note reviewed.  Constitutional:  Appearance: She is well-developed.     Comments: Frail and chronically ill-appearing, but not toxic.  HENT:     Head: Normocephalic and atraumatic.  Eyes:     General:        Right eye: No discharge.        Left eye: No discharge.     Conjunctiva/sclera: Conjunctivae normal.  Neck:     Musculoskeletal: Neck supple.  Cardiovascular:     Rate and Rhythm: Normal rate and regular rhythm.     Heart sounds: Normal heart sounds. No murmur. No friction rub. No gallop.   Pulmonary:     Effort: Pulmonary effort is normal. No respiratory distress.     Breath sounds: Normal breath sounds.  Abdominal:     General: There is no distension.     Palpations: Abdomen is soft.     Tenderness: There is no abdominal tenderness.  Musculoskeletal:        General: Tenderness and  signs of injury present.     Comments: Left lower extremity is shortened and externally rotated.  Extreme pain with attempted range of motion.  Closed injury.  Neurovascular intact distally.  Skin:    General: Skin is warm and dry.  Neurological:     Mental Status: She is alert.  Psychiatric:        Behavior: Behavior normal.        Thought Content: Thought content normal.      ED Treatments / Results  Labs (all labs ordered are listed, but only abnormal results are displayed) Labs Reviewed  CBC WITH DIFFERENTIAL/PLATELET - Abnormal; Notable for the following components:      Result Value   Hemoglobin 15.6 (*)    HCT 48.0 (*)    All other components within normal limits  BASIC METABOLIC PANEL - Abnormal; Notable for the following components:   CO2 21 (*)    Glucose, Bld 124 (*)    BUN 30 (*)    All other components within normal limits  SARS CORONAVIRUS 2 (HOSPITAL ORDER, [facility])    EKG None  Radiology Dg Chest 1 View  Result Date: 10/03/2018 CLINICAL DATA:  Preop, left hip fracture EXAM: CHEST  1 VIEW COMPARISON:  None. FINDINGS: Coarse interstitial changes are likely chronic. No acute consolidation. No convincing evidence of edema. No pneumothorax or effusion. The aorta is calcified and tortuous. The brachiocephalic vessels on the right are somewhat tortuous. Remaining cardiomediastinal contours are unremarkable. Question projectional irregularity versus fracture of the left humerus. Additional degenerative changes are present in the spine and shoulders. IMPRESSION: 1. No active disease.  Likely chronic coarse interstitial changes. 2. Question projectional irregularity versus fracture of the left humerus. Correlate with point tenderness and consider dedicated shoulder radiographs if there is clinical concern. Electronically Signed   By: Lovena Le M.D.   On: 10/03/2018 19:25   Dg Hip Unilat W Or Wo Pelvis 2-3 Views Left  Result Date:  10/03/2018 CLINICAL DATA:  Left hip pain, fall. Altered mental status EXAM: DG HIP (WITH OR WITHOUT PELVIS) 2-3V LEFT COMPARISON:  None. FINDINGS: Mildly comminuted, foreshortened and slightly valgus angulated transcervical left femoral neck fracture. Femoral head remains normally located. Question remote posttraumatic deformity of the left inferior and superior pubic ramus. No additional acute osseous abnormalities are identified. Degenerative changes are present in the hips and lower lumbar spine. Bone mineralization is diminished which may limit detection of smaller nondisplaced fractures. IMPRESSION: Mildly comminuted, foreshortened and slightly valgus  angulated transcervical left femoral neck fracture. Remote appearing posttraumatic deformity of the left inferior and superior pubic ramus Electronically Signed   By: Lovena Le M.D.   On: 10/03/2018 19:23    Procedures Procedures (including critical care time)  Medications Ordered in ED Medications  morphine 4 MG/ML injection 2 mg (2 mg Intravenous Given 10/03/18 1829)  LORazepam (ATIVAN) injection 0.5 mg (0.5 mg Intravenous Given 10/03/18 1828)     Initial Impression / Assessment and Plan / ED Course  I have reviewed the triage vital signs and the nursing notes.  Pertinent labs & imaging results that were available during my care of the patient were reviewed by me and considered in my medical decision making (see chart for details).       85 year old female with left hip fracture.  Orthopedics consulted.  Hospitalist admission.  Family at bedside and updated.  Final Clinical Impressions(s) / ED Diagnoses   Final diagnoses:  Closed fracture of neck of left femur, initial encounter [facility])    ED Discharge Orders    None       Virgel Manifold, MD 10/12/18 [phone]

## 2018-10-04 ENCOUNTER — Encounter (HOSPITAL_COMMUNITY)
Admission: EM | Disposition: A | Discharge status: Skilled Nursing Facility | Payer: Self-pay | Source: Skilled Nursing Facility | Attending: Internal Medicine

## 2018-10-04 ENCOUNTER — Encounter (HOSPITAL_COMMUNITY): Payer: Self-pay | Admitting: Orthopedic Surgery

## 2018-10-04 DIAGNOSIS — I4891 Unspecified atrial fibrillation: Secondary | ICD-10-CM

## 2018-10-04 DIAGNOSIS — S72002A Fracture of unspecified part of neck of left femur, initial encounter for closed fracture: Secondary | ICD-10-CM

## 2018-10-04 DIAGNOSIS — Z01810 Encounter for preprocedural cardiovascular examination: Secondary | ICD-10-CM

## 2018-10-04 DIAGNOSIS — F0390 Unspecified dementia without behavioral disturbance: Secondary | ICD-10-CM

## 2018-10-04 LAB — SURGICAL PCR SCREEN
MRSA, PCR: NEGATIVE
Staphylococcus aureus: NEGATIVE

## 2018-10-04 SURGERY — HEMIARTHROPLASTY, HIP, DIRECT ANTERIOR APPROACH, FOR FRACTURE
Anesthesia: Choice | Site: Hip | Laterality: Left

## 2018-10-04 MED ORDER — TRANEXAMIC ACID 1000 MG/10ML IV SOLN: 2000.0000 mg | INTRAVENOUS | Status: DC

## 2018-10-04 MED ORDER — FENTANYL CITRATE (PF) 250 MCG/5ML IJ SOLN
INTRAMUSCULAR | Status: AC
  Filled 2018-10-04: qty 5

## 2018-10-04 MED ORDER — POVIDONE-IODINE 10 % EX SWAB: 2.0000 "application " | Freq: Once | CUTANEOUS | Status: DC

## 2018-10-04 MED ORDER — SODIUM CHLORIDE 0.9 % IR SOLN
Status: DC | PRN
  Administered 2018-10-04: 3000 mL

## 2018-10-04 MED ORDER — PROPOFOL 10 MG/ML IV BOLUS
INTRAVENOUS | Status: AC
  Filled 2018-10-04: qty 20

## 2018-10-04 MED ORDER — TRANEXAMIC ACID-NACL 1000-0.7 MG/100ML-% IV SOLN
INTRAVENOUS | Status: AC
  Filled 2018-10-04: qty 100

## 2018-10-04 MED ORDER — 0.9 % SODIUM CHLORIDE (POUR BTL) OPTIME
TOPICAL | Status: DC | PRN
  Administered 2018-10-04: 1000 mL

## 2018-10-04 MED ORDER — ENOXAPARIN SODIUM 30 MG/0.3ML ~~LOC~~ SOLN
30.0000 mg | SUBCUTANEOUS | Status: DC
  Administered 2018-10-04: 30 mg via SUBCUTANEOUS
  Filled 2018-10-04: qty 0.3

## 2018-10-04 MED ORDER — DILTIAZEM HCL-DEXTROSE 100-5 MG/100ML-% IV SOLN (PREMIX)
5.0000 mg/h | INTRAVENOUS | Status: DC
  Administered 2018-10-04: 5 mg/h via INTRAVENOUS
  Filled 2018-10-04 (×4): qty 100

## 2018-10-04 MED ORDER — TRANEXAMIC ACID-NACL 1000-0.7 MG/100ML-% IV SOLN: 1000.0000 mg | INTRAVENOUS | Status: DC

## 2018-10-04 MED ORDER — VANCOMYCIN HCL 1000 MG IV SOLR
INTRAVENOUS | Status: AC
  Filled 2018-10-04: qty 1000

## 2018-10-04 MED ORDER — LACTATED RINGERS IV SOLN
INTRAVENOUS | Status: DC
  Administered 2018-10-04 – 2018-10-05 (×2): via INTRAVENOUS

## 2018-10-04 MED ORDER — CEFAZOLIN SODIUM-DEXTROSE 2-4 GM/100ML-% IV SOLN
2.0000 g | INTRAVENOUS | Status: DC
  Administered 2018-10-05: 2 g via INTRAVENOUS
  Filled 2018-10-04: qty 100

## 2018-10-04 NOTE — Consult Note (Addendum)
ORTHOPAEDIC CONSULTATION  REQUESTING PHYSICIAN: Rodena Goldmann, DO  Chief Complaint: Left femoral neck fracture  HPI: Casey Wood is a 85 y.o. female who presents with left hip fracture s/p mechanical fall.  The patient endorses severe pain in the left hip, that does not radiate, grinding in quality, worse with any movement, better with immobilization.  Denies LOC/fever/chills/nausea/vomiting. Walks minimally.  Has chronic afib not on anticoagulation.  Denies LOC, neck pain, abd pain.  Past Medical History:  Diagnosis Date   Alcohol abuse 02/07/2018   Atrial fibrillation (HCC)    CHF (congestive heart failure) ([place]) 02/07/2018   Dementia ([place]) 02/07/2018   GERD (gastroesophageal reflux disease)    High cholesterol    Hypertension    Lung cancer ([place])    Psychosis ([place]) 04/08/2018   Seizures ([place]) 02/07/2018   Thyroid disease    TIA (transient ischemic attack)    Past Surgical History:  Procedure Laterality Date   heart stent     LOBECTOMY     REPLACEMENT TOTAL KNEE Bilateral    Social History   Socioeconomic History   Marital status: Widowed    Spouse name: Not on file   Number of children: Not on file   Years of education: Not on file   Highest education level: Not on file  Occupational History   Not on file  Social Needs   Financial resource strain: Not on file   Food insecurity    Worry: Not on file    Inability: Not on file   Transportation needs    Medical: Not on file    Non-medical: Not on file  Tobacco Use   Smoking status: Never Smoker   Smokeless tobacco: Never Used  Substance and Sexual Activity   Alcohol use: Not Currently   Drug use: No   Sexual activity: Not Currently  Lifestyle   Physical activity    Days per week: Not on file    Minutes per session: Not on file   Stress: Not on file  Relationships   Social connections    Talks on phone: Not on file    Gets together: Not on file    Attends  religious service: Not on file    Active member of club or organization: Not on file    Attends meetings of clubs or organizations: Not on file    Relationship status: Not on file  Other Topics Concern   Not on file  Social History Narrative   Not on file   History reviewed. No pertinent family history. Allergies  Allergen Reactions   Levofloxacin     unknown   Prior to Admission medications   Medication Sig Start Date End Date Taking? Authorizing Provider  ALPRAZolam (XANAX) 0.25 MG tablet Take 1 tablet (0.25 mg total) by mouth at bedtime. 09/21/18  Yes Gerlene Fee, NP  busPIRone (BUSPAR) 5 MG tablet Take 5 mg by mouth 2 (two) times daily.   Yes [provider]  levothyroxine (SYNTHROID, LEVOTHROID) 50 MCG tablet Take 50 mcg by mouth daily before breakfast.   Yes [provider]  QUEtiapine (SEROQUEL) 25 MG tablet Take 25 mg by mouth every morning. At 9:00 am 07/31/18  Yes [provider]  QUEtiapine Fumarate (SEROQUEL PO) Take 12.5 mg by mouth at bedtime. 07/31/18  Yes [provider]   Dg Chest 1 View  Result Date: 10/03/2018 CLINICAL DATA:  Preop, left hip fracture EXAM: CHEST  1 VIEW COMPARISON:  None. FINDINGS: Coarse  interstitial changes are likely chronic. No acute consolidation. No convincing evidence of edema. No pneumothorax or effusion. The aorta is calcified and tortuous. The brachiocephalic vessels on the right are somewhat tortuous. Remaining cardiomediastinal contours are unremarkable. Question projectional irregularity versus fracture of the left humerus. Additional degenerative changes are present in the spine and shoulders. IMPRESSION: 1. No active disease.  Likely chronic coarse interstitial changes. 2. Question projectional irregularity versus fracture of the left humerus. Correlate with point tenderness and consider dedicated shoulder radiographs if there is clinical concern. Electronically Signed   By: Lovena Le M.D.   On:  10/03/2018 19:25   Dg Humerus Left  Result Date: 10/03/2018 CLINICAL DATA:  85 year old female with fall and left upper extremity pain. Left humeral fracture seen on the earlier chest radiograph. EXAM: LEFT HUMERUS - 2+ VIEW COMPARISON:  Chest radiograph dated 10/03/2018 FINDINGS: There is an age indeterminate fracture of the left humeral neck with impaction. The bones are osteopenic. There is no dislocation. Evaluation of the left elbow is limited on the provided images. The soft tissues are unremarkable. IMPRESSION: Age indeterminate fracture of the left humeral neck. Clinical correlation is recommended. Electronically Signed   By: Anner Crete M.D.   On: 10/03/2018 23:23   Dg Hip Unilat W Or Wo Pelvis 2-3 Views Left  Result Date: 10/03/2018 CLINICAL DATA:  Left hip pain, fall. Altered mental status EXAM: DG HIP (WITH OR WITHOUT PELVIS) 2-3V LEFT COMPARISON:  None. FINDINGS: Mildly comminuted, foreshortened and slightly valgus angulated transcervical left femoral neck fracture. Femoral head remains normally located. Question remote posttraumatic deformity of the left inferior and superior pubic ramus. No additional acute osseous abnormalities are identified. Degenerative changes are present in the hips and lower lumbar spine. Bone mineralization is diminished which may limit detection of smaller nondisplaced fractures. IMPRESSION: Mildly comminuted, foreshortened and slightly valgus angulated transcervical left femoral neck fracture. Remote appearing posttraumatic deformity of the left inferior and superior pubic ramus Electronically Signed   By: Lovena Le M.D.   On: 10/03/2018 19:23    All pertinent xrays, MRI, CT independently reviewed and interpreted  Positive ROS: All other systems have been reviewed and were otherwise negative with the exception of those mentioned in the HPI and as above.  Physical Exam: General: Alert, no acute distress Cardiovascular: No pedal edema Respiratory: No  cyanosis, no use of accessory musculature GI: No organomegaly, abdomen is soft and non-tender Skin: No lesions in the area of chief complaint Neurologic: Sensation intact distally Psychiatric: Patient has advanced dementia Lymphatic: No axillary or cervical lymphadenopathy  MUSCULOSKELETAL:  - severe pain with movement of the hip and extremity - skin intact - NVI distally - compartments soft  Assessment: Left femoral neck fracture  Plan: - partial hip replacement for pain relief and quality of life, Gillermo Murdoch aware of r/b/a and wish to proceed, informed consent obtained - medical optimization per primary team - surgery postponed until tomorrow afternoon because lovenox was given this morning which precludes spinal anesthesia, general anesthesia is high risk - needs cardiac consult and echo prior to surgery  Thank you for the consult and the opportunity to see Ms. Crothers  [name], MD 4:54 PM

## 2018-10-04 NOTE — ED Notes (Signed)
Pt HR fluctuating 112-120. Adm prn lopressor per order

## 2018-10-04 NOTE — ED Notes (Signed)
Pt lying in bed . Fidgeting with covers. Pt does not answer questions, just making sounds. No distress noted

## 2018-10-04 NOTE — OR Nursing (Signed)
Due to Medical issues pt is cancelled for today per anesthesia

## 2018-10-04 NOTE — Progress Notes (Signed)
Pre-op report called and given to Juliann Pulse, RN in Short Stay. No order for informed consent Juliann Pulse, RN aware. Pt's daughter, [name], at bedside. All questions answered to satisfaction. OR personnel transported pt off the unit via bed.

## 2018-10-04 NOTE — Consult Note (Signed)
Cardiology Consultation:   Patient ID: [name] MRN: [id number]; DOB: Nov 10, 1922  Admit date: 10/03/2018 Date of Consult: 10/04/2018  Primary Care Provider: Hennie Duos, MD Primary Cardiologist: New to [facility] Primary Electrophysiologist:  None    Patient Profile:   Casey Wood is a 85 y.o. female with a hx of alcohol abuse (in remission for 1 year), atrial fibrillation, dementia, heart failure, hypothyroidism, hypertension, hyper lipidemia, history of lung cancer s/p lobectomy in 2010, TIA and history of seizure in September 2019 who is being seen today for preoperative clearance after hip fracture and fall at the request of Dr. Erlinda Hong.  History of Present Illness:   Casey Wood is a 85 year old female with past medical history of alcohol abuse (in remission for 1 year), atrial fibrillation, dementia, heart failure, hypothyroidism, hypertension, hyper lipidemia, history of lung cancer s/p lobectomy in 2010, TIA and history of seizure in September 2019.  She has very severe dementia, majority of the history has been obtained from her daughter Casey Wood who has not seen her for the past 5 months due to COVID-19 pandemic.  She came here from [facility].  According to her daughter, she had a episode of seizure in September 2019 after she experimented with one of her girlfriend on a new "stemcell research" medication.  She has had history of atrial fibrillation for at least 9 years.  It is unclear to the daughter if she has chronic versus intermittent atrial fibrillation.  She was on Coumadin at one point, this was discontinued over a year ago due to advanced age and her risk for bleeding.  Her daughter moved the patient from [place] to [place] in December 2019.  Since September of last year, she has mainly relied on wheelchair to get around.  Despite so, she is remained fairly active and occasionally take other resident's blanket, this is likely related to worsening  dementia.  5 months ago, when her daughter last saw the patient, she was able to [facility] some answers however her speech was not very clear.  Patient presented to an apparent hospital yesterday after suffering a mechanical fall.  X-ray showed mildly comminuted left femoral neck fracture.  Due to the OR availability, patient was transferred to [facility] for surgery.  Cardiology has been consulted for cardiac clearance.  No prior EKG was available to compare however patient was in atrial fibrillation with RVR on arrival.  She is not on any AV nodal blocking agent or systemic anticoagulation therapy.  At this time, she is extremely confused and cannot follow any command.    Heart Pathway Score:     Past Medical History:  Diagnosis Date   Alcohol abuse 02/07/2018   Atrial fibrillation (HCC)    CHF (congestive heart failure) ([place]) 02/07/2018   Dementia ([place]) 02/07/2018   GERD (gastroesophageal reflux disease)    High cholesterol    Hypertension    Lung cancer ([place])    Psychosis ([place]) 04/08/2018   Seizures ([place]) 02/07/2018   Thyroid disease    TIA (transient ischemic attack)     Past Surgical History:  Procedure Laterality Date   LOBECTOMY     REPLACEMENT TOTAL KNEE Bilateral      Home Medications:  Prior to Admission medications   Medication Sig Start Date End Date Taking? Authorizing Provider  ALPRAZolam (XANAX) 0.25 MG tablet Take 1 tablet (0.25 mg total) by mouth at bedtime. 09/21/18  Yes Gerlene Fee, NP  busPIRone (BUSPAR) 5  MG tablet Take 5 mg by mouth 2 (two) times daily.   Yes [provider]  levothyroxine (SYNTHROID, LEVOTHROID) 50 MCG tablet Take 50 mcg by mouth daily before breakfast.   Yes [provider]  QUEtiapine (SEROQUEL) 25 MG tablet Take 25 mg by mouth every morning. At 9:00 am 07/31/18  Yes [provider]  QUEtiapine Fumarate (SEROQUEL PO) Take 12.5 mg by mouth at bedtime. 07/31/18  Yes [provider]     Inpatient Medications: Scheduled Meds:  ALPRAZolam  0.25 mg Oral QHS   busPIRone  5 mg Oral BID   levothyroxine  50 mcg Oral QAC breakfast   QUEtiapine  12.5 mg Oral QHS   QUEtiapine  25 mg Oral q morning - 10a   Continuous Infusions:  lactated ringers     PRN Meds: acetaminophen **OR** acetaminophen, morphine injection, ondansetron **OR** ondansetron (ZOFRAN) IV, polyethylene glycol  Allergies:    Allergies  Allergen Reactions   Levofloxacin     unknown    Social History:   Social History   Socioeconomic History   Marital status: Widowed    Spouse name: Not on file   Number of children: Not on file   Years of education: Not on file   Highest education level: Not on file  Occupational History   Not on file  Social Needs   Financial resource strain: Not on file   Food insecurity    Worry: Not on file    Inability: Not on file   Transportation needs    Medical: Not on file    Non-medical: Not on file  Tobacco Use   Smoking status: Never Smoker   Smokeless tobacco: Never Used  Substance and Sexual Activity   Alcohol use: Not Currently   Drug use: No   Sexual activity: Not Currently  Lifestyle   Physical activity    Days per week: Not on file    Minutes per session: Not on file   Stress: Not on file  Relationships   Social connections    Talks on phone: Not on file    Gets together: Not on file    Attends religious service: Not on file    Active member of club or organization: Not on file    Attends meetings of clubs or organizations: Not on file    Relationship status: Not on file   Intimate partner violence    Fear of current or ex partner: Not on file    Emotionally abused: Not on file    Physically abused: Not on file    Forced sexual activity: Not on file  Other Topics Concern   Not on file  Social History Narrative   Not on file    Family History:   Family History  Problem Relation Age of Onset   Heart attack  Father        14s     ROS:  Please see the history of present illness.   All other ROS reviewed and negative.     Physical Exam/Data:   Vitals:   10/04/18 1045 10/04/18 1130 10/04/18 1349 10/04/18 1552  BP:  (!) 138/113 (!) 134/114   Pulse: (!) 111 60 (!) 101   Resp: (!) 22 (!) 26 18   Temp:   97.6 F (36.4 C)   TempSrc:   Oral   SpO2: 94% 97% 98%   Weight:    44 kg  Height:    5' 2.01" (1.575 m)  Intake/Output Summary (Last 24 hours) at 10/04/2018 1851 Last data filed at 10/04/2018 1530 Gross per 24 hour  Intake --  Output 700 ml  Net -700 ml   Last 3 Weights 10/04/2018 10/03/2018 09/07/2018  Weight (lbs) 97 lb 97 lb 97 lb  Weight (kg) 44 kg 44 kg 43.999 kg     Body mass index is 17.74 kg/m.  General:  Frail, laying in bed, did not follow command and answer any question HEENT: normal Lymph: no adenopathy Neck: no JVD Endocrine:  No thryomegaly Vascular: No carotid bruits; FA pulses 2+ bilaterally without bruits  Cardiac:  Tachycardic, irregular. S1, S2; RRR; no murmur  Lungs:  clear to auscultation bilaterally, no wheezing, rhonchi or rales  Abd: soft, nontender, no hepatomegaly  Ext: no edema Musculoskeletal:  Painful with movement Skin: warm and dry  Neuro:  Unable to assess Psych:  Flat affect   EKG:  The EKG was personally reviewed and demonstrates:  Atrial fibrillation Telemetry:  Telemetry was personally reviewed and demonstrates:  Not on tele  Relevant CV Studies: N/A  Laboratory Data:  High Sensitivity Troponin:  No results for input(s): TROPONINIHS in the last 720 hours.   Cardiac EnzymesNo results for input(s): TROPONINI in the last 168 hours. No results for input(s): TROPIPOC in the last 168 hours.  Chemistry Recent Labs  Lab 10/03/18 1758  NA 142  K 4.3  CL 108  CO2 21*  GLUCOSE 124*  BUN 30*  CREATININE 0.81  CALCIUM 9.6  GFRNONAA >60  GFRAA >60  ANIONGAP 13    No results for input(s): PROT, ALBUMIN, AST, ALT, ALKPHOS, BILITOT  in the last 168 hours. Hematology Recent Labs  Lab 10/03/18 1758  WBC 8.4  RBC 4.84  HGB 15.6*  HCT 48.0*  MCV 99.2  MCH 32.2  MCHC 32.5  RDW 14.2  PLT 160   BNPNo results for input(s): BNP, PROBNP in the last 168 hours.  DDimer No results for input(s): DDIMER in the last 168 hours.   Radiology/Studies:  Dg Chest 1 View  Result Date: 10/03/2018 CLINICAL DATA:  Preop, left hip fracture EXAM: CHEST  1 VIEW COMPARISON:  None. FINDINGS: Coarse interstitial changes are likely chronic. No acute consolidation. No convincing evidence of edema. No pneumothorax or effusion. The aorta is calcified and tortuous. The brachiocephalic vessels on the right are somewhat tortuous. Remaining cardiomediastinal contours are unremarkable. Question projectional irregularity versus fracture of the left humerus. Additional degenerative changes are present in the spine and shoulders. IMPRESSION: 1. No active disease.  Likely chronic coarse interstitial changes. 2. Question projectional irregularity versus fracture of the left humerus. Correlate with point tenderness and consider dedicated shoulder radiographs if there is clinical concern. Electronically Signed   By: Lovena Le M.D.   On: 10/03/2018 19:25   Dg Humerus Left  Result Date: 10/03/2018 CLINICAL DATA:  85 year old female with fall and left upper extremity pain. Left humeral fracture seen on the earlier chest radiograph. EXAM: LEFT HUMERUS - 2+ VIEW COMPARISON:  Chest radiograph dated 10/03/2018 FINDINGS: There is an age indeterminate fracture of the left humeral neck with impaction. The bones are osteopenic. There is no dislocation. Evaluation of the left elbow is limited on the provided images. The soft tissues are unremarkable. IMPRESSION: Age indeterminate fracture of the left humeral neck. Clinical correlation is recommended. Electronically Signed   By: Anner Crete M.D.   On: 10/03/2018 23:23   Dg Hip Unilat W Or Wo Pelvis 2-3 Views  Left  Result  Date: 10/03/2018 CLINICAL DATA:  Left hip pain, fall. Altered mental status EXAM: DG HIP (WITH OR WITHOUT PELVIS) 2-3V LEFT COMPARISON:  None. FINDINGS: Mildly comminuted, foreshortened and slightly valgus angulated transcervical left femoral neck fracture. Femoral head remains normally located. Question remote posttraumatic deformity of the left inferior and superior pubic ramus. No additional acute osseous abnormalities are identified. Degenerative changes are present in the hips and lower lumbar spine. Bone mineralization is diminished which may limit detection of smaller nondisplaced fractures. IMPRESSION: Mildly comminuted, foreshortened and slightly valgus angulated transcervical left femoral neck fracture. Remote appearing posttraumatic deformity of the left inferior and superior pubic ramus Electronically Signed   By: Lovena Le M.D.   On: 10/03/2018 19:23    Assessment and Plan:   1. Preoperative clearance prior to hip fracture repair: Patient has severe dementia and unable to answer any questions.  She was previously wheelchair-bound, now she is bedbound.  She is unable to follow any commands due to confusion.  At this time, I am not clear if surgery will improve her quality of life.  Her daughter insisted on proceeding with surgery mainly because she does not want her mother to suffer in pain.  Case discussed with Dr. Martinique, she is a high risk patient proceeding with a moderate risk procedure.  Pending echocardiogram  2. Atrial fibrillation with RVR of unknown duration: Some of her fast heart rate is likely related to chest pain.  She is not on any systemic anticoagulation therapy or rate control therapy.  We recommended at least IV diltiazem to help control her heart rate during the perioperative phase, however her daughter did not wish to add any medication.  3. Dementia: She requires 24/7 care  4. Hypothyroidism: On Synthroid at home  5. Hypertension: Despite prior  diagnosis of hypertension, she is not on any blood pressure medication at home.  6. Hyperlipidemia: She is not on any statin medication, at this point, statin medication likely is not going to help her either.  7. History of lung cancer status post lobectomy in 2010      For questions or updates, please contact [place] Please consult [email or web address] for contact info under     Hilbert Corrigan, [place]  10/04/2018 6:51 PM

## 2018-10-04 NOTE — Progress Notes (Signed)
Noted order for Cardizem drip, call out to covering MD, unable to provide this care on this unit. Dr. Baltazar Najjar with orders to transfer pt to unit for higher level or care.   Report called to [facility], daughter Clemens Catholic updated: [phone].

## 2018-10-04 NOTE — Progress Notes (Signed)
Pt arrived to room 5N18 via [facility]. Received report from [place], Therapist, sports at [facility]. See assessment. Will continue to monitor.

## 2018-10-04 NOTE — Progress Notes (Signed)
PROGRESS NOTE    Casey Wood  AYT:[id number] DOB: 1922/08/17 DOA: 10/03/2018 PCP: Hennie Duos, MD   Brief Narrative:  Per HPI: Casey Wood is a 85 y.o. female with medical history significant for dementia with psychosis, atrial fibrillation, seizure, CHF, who was brought to the ED from [place] home with reports of a fall.  Daughter- Casey Wood, is present at bedside, she has not seen her mother in 59 months.  Patient is confused, unable to answer my questions, but this is her baseline.  At baseline she ambulates with a wheelchair.  Patient's heart rate remains controlled this morning with heart rates as high as 120 bpm and responds well to metoprolol as needed.  She appears to be comfortable and cannot give much history otherwise.  She is awaiting transfer to [facility] for orthopedic surgery evaluation.  Assessment & Plan:   Principal Problem:   Closed displaced fracture of left femoral neck (HCC)   Left femoral neck fracture status post fall -Patient uses wheelchair at home -Follow-up orthopedics evaluation after transfer to [place] n.p.o. unless not transferring until later today at which point we will order diet and keep n.p.o. after midnight -IV morphine 2 mg ordered as needed  Atrial fibrillation with RVR-improved control -Continue on IV metoprolol as ordered -Continue IV fluid for now and reevaluate once diet initiated  Left humerus-age-indeterminate fracture -Further evaluation per orthopedics  Dementia with psychosis-stable -Long-term nursing home resident -We will plan to continue home Seroquel, buspirone, and Xanax  Unspecified CHF- currently compensated -Continue to monitor with daily weights -No 2D echocardiogram on file and not currently needed  Seizure history -Not currently on antiepileptic agents  Hypothyroidism -Continue home Synthroid  Hypertension-stable -Not currently on any antihypertensives   DVT prophylaxis:  SCDs-added Lovenox Code Status: DNR Family Communication: We will plan to update daughter Casey Wood who is HCPOA Disposition Plan: Plan to transfer to [facility] for orthopedic surgery evaluation.  Continue on pain control as ordered.  Continue to monitor on telemetry.   Consultants:   Orthopedics Dr. Erlinda Hong  Procedures:   None  Antimicrobials:   None   Subjective: Patient seen and evaluated today with no new acute complaints or concerns.  Some elevated heart rates noted overnight for which she was given some IV metoprolol with good improvement.  No significant pain complaints noted.  Objective: Vitals:   10/04/18 0330 10/04/18 0630 10/04/18 0730 10/04/18 0800  BP: (!) 125/108 (!) 140/104 (!) 151/100 (!) 145/98  Pulse: (!) 111 (!) 123 89 95  Resp: (!) 28 20 18 18   Temp:      TempSrc:      SpO2: 97% 92% 97% 92%  Weight:      Height:        Intake/Output Summary (Last 24 hours) at 10/04/2018 0827 Last data filed at 10/04/2018 0802 Gross per 24 hour  Intake --  Output 500 ml  Net -500 ml   Filed Weights   10/03/18 1752  Weight: 44 kg    Examination:  General exam: Appears calm and comfortable  Respiratory system: Clear to auscultation. Respiratory effort normal. Cardiovascular system: S1 & S2 heard, irregular and mildly tachycardic. No JVD, murmurs, rubs, gallops or clicks. No pedal edema. Gastrointestinal system: Abdomen is nondistended, soft and nontender. No organomegaly or masses felt. Normal bowel sounds heard. Central nervous system: Alert and awake Extremities: No significant edema Skin: No rashes, lesions or ulcers Psychiatry: Flat affect    Data Reviewed: I  have personally reviewed following labs and imaging studies  CBC: Recent Labs  Lab 10/03/18 1758  WBC 8.4  NEUTROABS 4.9  HGB 15.6*  HCT 48.0*  MCV 99.2  PLT 852   Basic Metabolic Panel: Recent Labs  Lab 10/03/18 1758  NA 142  K 4.3  CL 108  CO2 21*  GLUCOSE 124*  BUN 30*  CREATININE  0.81  CALCIUM 9.6   GFR: Estimated Creatinine Clearance: 29.5 mL/min (by C-G formula based on SCr of 0.81 mg/dL). Liver Function Tests: No results for input(s): AST, ALT, ALKPHOS, BILITOT, PROT, ALBUMIN in the last 168 hours. No results for input(s): LIPASE, AMYLASE in the last 168 hours. No results for input(s): AMMONIA in the last 168 hours. Coagulation Profile: No results for input(s): INR, PROTIME in the last 168 hours. Cardiac Enzymes: No results for input(s): CKTOTAL, CKMB, CKMBINDEX, TROPONINI in the last 168 hours. BNP (last 3 results) No results for input(s): PROBNP in the last 8760 hours. HbA1C: No results for input(s): HGBA1C in the last 72 hours. CBG: No results for input(s): GLUCAP in the last 168 hours. Lipid Profile: No results for input(s): CHOL, HDL, LDLCALC, TRIG, CHOLHDL, LDLDIRECT in the last 72 hours. Thyroid Function Tests: No results for input(s): TSH, T4TOTAL, FREET4, T3FREE, THYROIDAB in the last 72 hours. Anemia Panel: No results for input(s): VITAMINB12, FOLATE, FERRITIN, TIBC, IRON, RETICCTPCT in the last 72 hours. Sepsis Labs: No results for input(s): PROCALCITON, LATICACIDVEN in the last 168 hours.  Recent Results (from the past 240 hour(s))  SARS Coronavirus 2 [facility] order, Performed in [facility] hospital lab) Nasopharyngeal Nasopharyngeal Swab     Status: None   Collection Time: 10/03/18  8:10 PM   Specimen: Nasopharyngeal Swab  Result Value Ref Range Status   SARS Coronavirus 2 NEGATIVE NEGATIVE Final    Comment: (NOTE) If result is NEGATIVE SARS-CoV-2 target nucleic acids are NOT DETECTED. The SARS-CoV-2 RNA is generally detectable in upper and lower  respiratory specimens during the acute phase of infection. The lowest  concentration of SARS-CoV-2 viral copies this assay can detect is 250  copies / mL. A negative result does not preclude SARS-CoV-2 infection  and should not be used as the sole basis for treatment or other  patient  management decisions.  A negative result may occur with  improper specimen collection / handling, submission of specimen other  than nasopharyngeal swab, presence of viral mutation(s) within the  areas targeted by this assay, and inadequate number of viral copies  (<250 copies / mL). A negative result must be combined with clinical  observations, patient history, and epidemiological information. If result is POSITIVE SARS-CoV-2 target nucleic acids are DETECTED. The SARS-CoV-2 RNA is generally detectable in upper and lower  respiratory specimens dur ing the acute phase of infection.  Positive  results are indicative of active infection with SARS-CoV-2.  Clinical  correlation with patient history and other diagnostic information is  necessary to determine patient infection status.  Positive results do  not rule out bacterial infection or co-infection with other viruses. If result is PRESUMPTIVE POSTIVE SARS-CoV-2 nucleic acids MAY BE PRESENT.   A presumptive positive result was obtained on the submitted specimen  and confirmed on repeat testing.  While 2019 novel coronavirus  (SARS-CoV-2) nucleic acids may be present in the submitted sample  additional confirmatory testing may be necessary for epidemiological  and / or clinical management purposes  to differentiate between  SARS-CoV-2 and other Sarbecovirus currently known to infect humans.  If clinically indicated additional testing with an alternate test  methodology [phone]) is advised. The SARS-CoV-2 RNA is generally  detectable in upper and lower respiratory sp ecimens during the acute  phase of infection. The expected result is Negative. Fact Sheet for Patients:  StrictlyIdeas.no Fact Sheet for Healthcare Providers: BankingDealers.co.za This test is not yet approved or cleared by the [place] FDA and has been authorized for detection and/or diagnosis of SARS-CoV-2 by FDA under  an Emergency Use Authorization (EUA).  This EUA will remain in effect (meaning this test can be used) for the duration of the COVID-19 declaration under Section 564(b)(1) of the Act, 21 U.S.C. section 360bbb-3(b)(1), unless the authorization is terminated or revoked sooner. Performed at [facility], [address], [place], [place] [address]          Radiology Studies: Dg Chest 1 View  Result Date: 10/03/2018 CLINICAL DATA:  Preop, left hip fracture EXAM: CHEST  1 VIEW COMPARISON:  None. FINDINGS: Coarse interstitial changes are likely chronic. No acute consolidation. No convincing evidence of edema. No pneumothorax or effusion. The aorta is calcified and tortuous. The brachiocephalic vessels on the right are somewhat tortuous. Remaining cardiomediastinal contours are unremarkable. Question projectional irregularity versus fracture of the left humerus. Additional degenerative changes are present in the spine and shoulders. IMPRESSION: 1. No active disease.  Likely chronic coarse interstitial changes. 2. Question projectional irregularity versus fracture of the left humerus. Correlate with point tenderness and consider dedicated shoulder radiographs if there is clinical concern. Electronically Signed   By: Lovena Le M.D.   On: 10/03/2018 19:25   Dg Humerus Left  Result Date: 10/03/2018 CLINICAL DATA:  85 year old female with fall and left upper extremity pain. Left humeral fracture seen on the earlier chest radiograph. EXAM: LEFT HUMERUS - 2+ VIEW COMPARISON:  Chest radiograph dated 10/03/2018 FINDINGS: There is an age indeterminate fracture of the left humeral neck with impaction. The bones are osteopenic. There is no dislocation. Evaluation of the left elbow is limited on the provided images. The soft tissues are unremarkable. IMPRESSION: Age indeterminate fracture of the left humeral neck. Clinical correlation is recommended. Electronically Signed   By: Anner Crete M.D.   On: 10/03/2018  23:23   Dg Hip Unilat W Or Wo Pelvis 2-3 Views Left  Result Date: 10/03/2018 CLINICAL DATA:  Left hip pain, fall. Altered mental status EXAM: DG HIP (WITH OR WITHOUT PELVIS) 2-3V LEFT COMPARISON:  None. FINDINGS: Mildly comminuted, foreshortened and slightly valgus angulated transcervical left femoral neck fracture. Femoral head remains normally located. Question remote posttraumatic deformity of the left inferior and superior pubic ramus. No additional acute osseous abnormalities are identified. Degenerative changes are present in the hips and lower lumbar spine. Bone mineralization is diminished which may limit detection of smaller nondisplaced fractures. IMPRESSION: Mildly comminuted, foreshortened and slightly valgus angulated transcervical left femoral neck fracture. Remote appearing posttraumatic deformity of the left inferior and superior pubic ramus Electronically Signed   By: Lovena Le M.D.   On: 10/03/2018 19:23        Scheduled Meds:  ALPRAZolam  0.25 mg Oral QHS   busPIRone  5 mg Oral BID   enoxaparin (LOVENOX) injection  40 mg Subcutaneous Q24H   levothyroxine  50 mcg Oral QAC breakfast   QUEtiapine  12.5 mg Oral QHS   QUEtiapine  25 mg Oral q morning - 10a   Continuous Infusions:  sodium chloride 75 mL/hr at 10/04/18 0752     LOS: 1 day  Time spent: 30 minutes    Raeshawn Vo Darleen Crocker, DO [facility] Pager [phone]  If 7PM-7AM, please contact night-coverage [email or web address] Password [facility] 10/04/2018, 8:27 AM

## 2018-10-04 NOTE — Progress Notes (Signed)
Pt returned to room 5N18. Will continue to monitor.

## 2018-10-04 NOTE — Plan of Care (Signed)

## 2018-10-04 NOTE — Progress Notes (Signed)
I have discussed surgery with daughter [name] who is in agreement and has provided informed consent.  We will plan for surgery this afternoon at 4:30 pm.  Continue NPO and hold lovenox.    Azucena Cecil, MD [facility] [phone] 1:59 PM

## 2018-10-05 ENCOUNTER — Inpatient Hospital Stay (HOSPITAL_COMMUNITY): Payer: Medicare HMO | Admitting: Certified Registered Nurse Anesthetist

## 2018-10-05 ENCOUNTER — Inpatient Hospital Stay (HOSPITAL_COMMUNITY): Payer: Medicare HMO

## 2018-10-05 ENCOUNTER — Encounter (HOSPITAL_COMMUNITY): Payer: Self-pay | Admitting: *Deleted

## 2018-10-05 ENCOUNTER — Encounter (HOSPITAL_COMMUNITY)
Admission: EM | Disposition: A | Discharge status: Skilled Nursing Facility | Payer: Self-pay | Source: Skilled Nursing Facility | Attending: Internal Medicine

## 2018-10-05 HISTORY — PX: TOTAL HIP ARTHROPLASTY: SHX124

## 2018-10-05 LAB — BASIC METABOLIC PANEL
Anion gap: 10 (ref 5–15)
BUN: 20 mg/dL (ref 8–23)
CO2: 22 mmol/L (ref 22–32)
Calcium: 9.4 mg/dL (ref 8.9–10.3)
Chloride: 108 mmol/L (ref 98–111)
Creatinine, Ser: 0.69 mg/dL (ref 0.44–1.00)
GFR calc Af Amer: >60 mL/min (ref >60)
GFR calc non Af Amer: >60 mL/min (ref >60)
Glucose, Bld: 151 mg/dL — ABNORMAL HIGH (ref 70–99)
Potassium: 4 mmol/L (ref 3.5–5.1)
Sodium: 140 mmol/L (ref 135–145)

## 2018-10-05 LAB — CBC
HCT: 47.1 % — ABNORMAL HIGH (ref 36.0–46.0)
HCT: 45 % (ref 36.0–46.0)
Hemoglobin: 16.1 g/dL — ABNORMAL HIGH (ref 12.0–15.0)
Hemoglobin: 15.2 g/dL — ABNORMAL HIGH (ref 12.0–15.0)
MCH: 33.4 pg (ref 26.0–34.0)
MCH: 33.3 pg (ref 26.0–34.0)
MCHC: 34.2 g/dL (ref 30.0–36.0)
MCHC: 33.8 g/dL (ref 30.0–36.0)
MCV: 97.7 fL (ref 80.0–100.0)
MCV: 98.7 fL (ref 80.0–100.0)
Platelets: 175 10*3/uL (ref 150–400)
Platelets: 126 10*3/uL — ABNORMAL LOW (ref 150–400)
RBC: 4.82 MIL/uL (ref 3.87–5.11)
RBC: 4.56 MIL/uL (ref 3.87–5.11)
RDW: 14.2 % (ref 11.5–15.5)
RDW: 14.3 % (ref 11.5–15.5)
WBC: 10.2 10*3/uL (ref 4.0–10.5)
WBC: 9.7 10*3/uL (ref 4.0–10.5)
nRBC: 0 % (ref 0.0–0.2)
nRBC: 0 % (ref 0.0–0.2)

## 2018-10-05 LAB — CREATININE, SERUM
Creatinine, Ser: 0.87 mg/dL (ref 0.44–1.00)
GFR calc Af Amer: >60 mL/min (ref >60)
GFR calc non Af Amer: 57 mL/min — ABNORMAL LOW (ref >60)

## 2018-10-05 SURGERY — ARTHROPLASTY, HIP, TOTAL, ANTERIOR APPROACH
Anesthesia: Spinal | Laterality: Left

## 2018-10-05 MED ORDER — SODIUM CHLORIDE 0.9 % IR SOLN
Status: DC | PRN
  Administered 2018-10-05: 3000 mL

## 2018-10-05 MED ORDER — METOPROLOL TARTRATE 5 MG/5ML IV SOLN
2.5000 mg | Freq: Four times a day (QID) | INTRAVENOUS | Status: DC | PRN
  Administered 2018-10-06: 2.5 mg via INTRAVENOUS
  Filled 2018-10-05 (×2): qty 5

## 2018-10-05 MED ORDER — MAGNESIUM CITRATE PO SOLN: 1.0000 | Freq: Once | ORAL | Status: DC | PRN

## 2018-10-05 MED ORDER — SORBITOL 70 % SOLN: 30.0000 mL | Freq: Every day | Status: DC | PRN

## 2018-10-05 MED ORDER — HYDROCODONE-ACETAMINOPHEN 7.5-325 MG PO TABS: 1.0000 | ORAL_TABLET | ORAL | Status: DC | PRN

## 2018-10-05 MED ORDER — MORPHINE SULFATE (PF) 2 MG/ML IV SOLN: 1.0000 mg | INTRAVENOUS | Status: DC | PRN

## 2018-10-05 MED ORDER — DILTIAZEM HCL-DEXTROSE 100-5 MG/100ML-% IV SOLN (PREMIX)
5.0000 mg/h | INTRAVENOUS | Status: DC
  Administered 2018-10-05 (×2): 10 mg/h via INTRAVENOUS
  Administered 2018-10-05: 5 mg/h via INTRAVENOUS
  Administered 2018-10-06: 10 mg/h via INTRAVENOUS
  Filled 2018-10-05 (×2): qty 100

## 2018-10-05 MED ORDER — TRANEXAMIC ACID 1000 MG/10ML IV SOLN
INTRAVENOUS | Status: DC | PRN
  Administered 2018-10-05: 2000 mg via TOPICAL

## 2018-10-05 MED ORDER — VANCOMYCIN HCL 1000 MG IV SOLR
INTRAVENOUS | Status: AC
  Filled 2018-10-05: qty 1000

## 2018-10-05 MED ORDER — EPHEDRINE SULFATE 50 MG/ML IJ SOLN
INTRAMUSCULAR | Status: DC | PRN
  Administered 2018-10-05: 10 mg via INTRAVENOUS

## 2018-10-05 MED ORDER — ONDANSETRON HCL 4 MG/2ML IJ SOLN: 4.0000 mg | Freq: Four times a day (QID) | INTRAMUSCULAR | Status: DC | PRN

## 2018-10-05 MED ORDER — ROCURONIUM BROMIDE 50 MG/5ML IV SOSY
PREFILLED_SYRINGE | INTRAVENOUS | Status: DC | PRN
  Administered 2018-10-05: 60 mg via INTRAVENOUS

## 2018-10-05 MED ORDER — EPHEDRINE SULFATE 50 MG/ML IJ SOLN
INTRAMUSCULAR | Status: DC | PRN
  Administered 2018-10-05 (×3): 10 mg via INTRAVENOUS

## 2018-10-05 MED ORDER — ONDANSETRON HCL 4 MG/2ML IJ SOLN
INTRAMUSCULAR | Status: AC
  Filled 2018-10-05: qty 2

## 2018-10-05 MED ORDER — PROPOFOL 10 MG/ML IV BOLUS
INTRAVENOUS | Status: AC
  Filled 2018-10-05: qty 20

## 2018-10-05 MED ORDER — CEFAZOLIN SODIUM 1 G IJ SOLR
INTRAMUSCULAR | Status: AC
  Filled 2018-10-05: qty 20

## 2018-10-05 MED ORDER — SUGAMMADEX SODIUM 200 MG/2ML IV SOLN
INTRAVENOUS | Status: DC | PRN
  Administered 2018-10-05: 168 mg via INTRAVENOUS

## 2018-10-05 MED ORDER — VANCOMYCIN HCL 1 G IV SOLR
INTRAVENOUS | Status: DC | PRN
  Administered 2018-10-05: 1000 mg via TOPICAL

## 2018-10-05 MED ORDER — FENTANYL CITRATE (PF) 250 MCG/5ML IJ SOLN
INTRAMUSCULAR | Status: DC | PRN
  Administered 2018-10-05 (×2): 50 ug via INTRAVENOUS

## 2018-10-05 MED ORDER — TRANEXAMIC ACID 1000 MG/10ML IV SOLN
2000.0000 mg | Freq: Once | INTRAVENOUS | Status: DC
  Filled 2018-10-05: qty 20

## 2018-10-05 MED ORDER — ACETAMINOPHEN 500 MG PO TABS
500.0000 mg | ORAL_TABLET | Freq: Four times a day (QID) | ORAL | Status: AC
  Administered 2018-10-06 (×2): 500 mg via ORAL
  Filled 2018-10-05 (×3): qty 1

## 2018-10-05 MED ORDER — SODIUM CHLORIDE 0.9 % IV SOLN
INTRAVENOUS | Status: DC
  Administered 2018-10-05 – 2018-10-06 (×2): via INTRAVENOUS

## 2018-10-05 MED ORDER — PHENOL 1.4 % MT LIQD: 1.0000 | OROMUCOSAL | Status: DC | PRN

## 2018-10-05 MED ORDER — HYDROCODONE-ACETAMINOPHEN 5-325 MG PO TABS
1.0000 | ORAL_TABLET | ORAL | Status: DC | PRN
  Administered 2018-10-07: 1 via ORAL
  Filled 2018-10-05: qty 1

## 2018-10-05 MED ORDER — METHOCARBAMOL 1000 MG/10ML IJ SOLN
500.0000 mg | Freq: Four times a day (QID) | INTRAVENOUS | Status: DC | PRN
  Filled 2018-10-05: qty 5

## 2018-10-05 MED ORDER — LIDOCAINE 2% (20 MG/ML) 5 ML SYRINGE
INTRAMUSCULAR | Status: DC | PRN
  Administered 2018-10-05: 100 mg via INTRAVENOUS

## 2018-10-05 MED ORDER — MENTHOL 3 MG MT LOZG: 1.0000 | LOZENGE | OROMUCOSAL | Status: DC | PRN

## 2018-10-05 MED ORDER — METHOCARBAMOL 500 MG PO TABS: 500.0000 mg | ORAL_TABLET | Freq: Four times a day (QID) | ORAL | Status: DC | PRN

## 2018-10-05 MED ORDER — SODIUM CHLORIDE 0.9 % IV SOLN
INTRAVENOUS | Status: DC | PRN
  Administered 2018-10-05 (×6): 80 ug via INTRAVENOUS

## 2018-10-05 MED ORDER — ALUM & MAG HYDROXIDE-SIMETH 200-200-20 MG/5ML PO SUSP: 30.0000 mL | ORAL | Status: DC | PRN

## 2018-10-05 MED ORDER — ONDANSETRON HCL 4 MG/2ML IJ SOLN
INTRAMUSCULAR | Status: DC | PRN
  Administered 2018-10-05: 4 mg via INTRAVENOUS

## 2018-10-05 MED ORDER — ACETAMINOPHEN 325 MG PO TABS: 325.0000 mg | ORAL_TABLET | Freq: Four times a day (QID) | ORAL | Status: DC | PRN

## 2018-10-05 MED ORDER — SODIUM CHLORIDE 0.9 % IV SOLN
INTRAVENOUS | Status: DC | PRN
  Administered 2018-10-05: 17:00:00 50 ug/min via INTRAVENOUS

## 2018-10-05 MED ORDER — 0.9 % SODIUM CHLORIDE (POUR BTL) OPTIME
TOPICAL | Status: DC | PRN
  Administered 2018-10-05: 1000 mL

## 2018-10-05 MED ORDER — ENOXAPARIN SODIUM 40 MG/0.4ML ~~LOC~~ SOLN: 40.0000 mg | Freq: Every day | SUBCUTANEOUS | 13 refills | Status: DC

## 2018-10-05 MED ORDER — ONDANSETRON HCL 4 MG PO TABS: 4.0000 mg | ORAL_TABLET | Freq: Four times a day (QID) | ORAL | Status: DC | PRN

## 2018-10-05 MED ORDER — ENOXAPARIN SODIUM 30 MG/0.3ML ~~LOC~~ SOLN
30.0000 mg | SUBCUTANEOUS | Status: DC
  Administered 2018-10-06 – 2018-10-08 (×3): 30 mg via SUBCUTANEOUS
  Filled 2018-10-05 (×4): qty 0.3

## 2018-10-05 MED ORDER — DOCUSATE SODIUM 100 MG PO CAPS
100.0000 mg | ORAL_CAPSULE | Freq: Two times a day (BID) | ORAL | Status: DC
  Administered 2018-10-06 (×2): 100 mg via ORAL
  Filled 2018-10-05 (×4): qty 1

## 2018-10-05 MED ORDER — STERILE WATER FOR IRRIGATION IR SOLN
Status: DC | PRN
  Administered 2018-10-05: 1000 mL

## 2018-10-05 MED ORDER — FENTANYL CITRATE (PF) 250 MCG/5ML IJ SOLN
INTRAMUSCULAR | Status: AC
  Filled 2018-10-05: qty 5

## 2018-10-05 MED ORDER — ENOXAPARIN SODIUM 40 MG/0.4ML ~~LOC~~ SOLN: 40.0000 mg | SUBCUTANEOUS | Status: DC

## 2018-10-05 MED ORDER — CEFAZOLIN SODIUM-DEXTROSE 2-4 GM/100ML-% IV SOLN
2.0000 g | Freq: Three times a day (TID) | INTRAVENOUS | Status: AC
  Administered 2018-10-06 (×2): 2 g via INTRAVENOUS
  Filled 2018-10-05 (×2): qty 100

## 2018-10-05 MED ORDER — POLYETHYLENE GLYCOL 3350 17 G PO PACK
17.0000 g | PACK | Freq: Every day | ORAL | Status: DC | PRN
  Filled 2018-10-05: qty 1

## 2018-10-05 MED ORDER — PROMETHAZINE HCL 25 MG/ML IJ SOLN: 12.5000 mg | Freq: Four times a day (QID) | INTRAMUSCULAR | Status: DC | PRN

## 2018-10-05 MED ORDER — EPHEDRINE 5 MG/ML INJ
INTRAVENOUS | Status: AC
  Filled 2018-10-05: qty 10

## 2018-10-05 MED ORDER — FENTANYL CITRATE (PF) 100 MCG/2ML IJ SOLN: 25.0000 ug | INTRAMUSCULAR | Status: DC | PRN

## 2018-10-05 MED ORDER — PHENYLEPHRINE 40 MCG/ML (10ML) SYRINGE FOR IV PUSH (FOR BLOOD PRESSURE SUPPORT)
PREFILLED_SYRINGE | INTRAVENOUS | Status: AC
  Filled 2018-10-05: qty 10

## 2018-10-05 MED ORDER — PROPOFOL 10 MG/ML IV BOLUS
INTRAVENOUS | Status: DC | PRN
  Administered 2018-10-05: 50 mg via INTRAVENOUS

## 2018-10-05 MED ORDER — HYDROCODONE-ACETAMINOPHEN 5-325 MG PO TABS: 1.0000 | ORAL_TABLET | Freq: Three times a day (TID) | ORAL | 0 refills | Status: DC | PRN

## 2018-10-05 MED ORDER — DILTIAZEM HCL-DEXTROSE 100-5 MG/100ML-% IV SOLN (PREMIX): 5.0000 mg/h | INTRAVENOUS | Status: DC

## 2018-10-05 SURGICAL SUPPLY — 56 items
BAG DECANTER FOR FLEXI CONT (MISCELLANEOUS) ×2 IMPLANT
BIPOLAR PROS AML 44 (Hips) ×2 IMPLANT
CELLS DAT CNTRL 66122 CELL SVR (MISCELLANEOUS) IMPLANT
COVER PERINEAL POST (MISCELLANEOUS) ×2 IMPLANT
COVER SURGICAL LIGHT HANDLE (MISCELLANEOUS) ×2 IMPLANT
COVER WAND RF STERILE (DRAPES) ×2 IMPLANT
DRAPE C-ARM 42X72 X-RAY (DRAPES) ×2 IMPLANT
DRAPE POUCH INSTRU U-SHP 10X18 (DRAPES) ×2 IMPLANT
DRAPE STERI IOBAN 125X83 (DRAPES) ×2 IMPLANT
DRAPE U-SHAPE 47X51 STRL (DRAPES) ×4 IMPLANT
DRSG AQUACEL AG ADV 3.5X10 (GAUZE/BANDAGES/DRESSINGS) ×2 IMPLANT
DRSG MEPILEX BORDER 4X8 (GAUZE/BANDAGES/DRESSINGS) ×1 IMPLANT
DURAPREP 26ML APPLICATOR (WOUND CARE) ×4 IMPLANT
ELECT BLADE 4.0 EZ CLEAN MEGAD (MISCELLANEOUS) ×2
ELECT REM PT RETURN 9FT ADLT (ELECTROSURGICAL) ×2
ELECTRODE BLDE 4.0 EZ CLN MEGD (MISCELLANEOUS) ×1 IMPLANT
ELECTRODE REM PT RTRN 9FT ADLT (ELECTROSURGICAL) ×1 IMPLANT
GLOVE BIOGEL PI IND STRL 7.0 (GLOVE) ×1 IMPLANT
GLOVE BIOGEL PI INDICATOR 7.0 (GLOVE) ×1
GLOVE ECLIPSE 7.0 STRL STRAW (GLOVE) ×4 IMPLANT
GLOVE SKINSENSE NS SZ7.5 (GLOVE) ×1
GLOVE SKINSENSE STRL SZ7.5 (GLOVE) ×1 IMPLANT
GLOVE SURG SYN 7.5  E (GLOVE) ×4
GLOVE SURG SYN 7.5 E (GLOVE) ×4 IMPLANT
GLOVE SURG SYN 7.5 PF PI (GLOVE) ×4 IMPLANT
GOWN STRL REIN XL XLG (GOWN DISPOSABLE) ×2 IMPLANT
GOWN STRL REUS W/ TWL LRG LVL3 (GOWN DISPOSABLE) IMPLANT
GOWN STRL REUS W/ TWL XL LVL3 (GOWN DISPOSABLE) ×1 IMPLANT
GOWN STRL REUS W/TWL LRG LVL3 (GOWN DISPOSABLE)
GOWN STRL REUS W/TWL XL LVL3 (GOWN DISPOSABLE) ×1
HANDPIECE INTERPULSE COAX TIP (DISPOSABLE) ×1
HEAD BIPOLAR PROS AML 44 (Hips) IMPLANT
HEAD FEM STD 28X+1.5 STRL (Hips) ×1 IMPLANT
HOOD PEEL AWAY FLYTE STAYCOOL (MISCELLANEOUS) ×4 IMPLANT
IV NS IRRIG 3000ML ARTHROMATIC (IV SOLUTION) ×2 IMPLANT
KIT BASIN OR (CUSTOM PROCEDURE TRAY) ×2 IMPLANT
MARKER SKIN DUAL TIP RULER LAB (MISCELLANEOUS) ×2 IMPLANT
NDL SPNL 18GX3.5 QUINCKE PK (NEEDLE) ×1 IMPLANT
NEEDLE SPNL 18GX3.5 QUINCKE PK (NEEDLE) ×2 IMPLANT
PACK TOTAL JOINT (CUSTOM PROCEDURE TRAY) ×2 IMPLANT
PACK UNIVERSAL I (CUSTOM PROCEDURE TRAY) ×2 IMPLANT
RETRACTOR WND ALEXIS 18 MED (MISCELLANEOUS) IMPLANT
RTRCTR WOUND ALEXIS 18CM MED (MISCELLANEOUS)
SAW OSC TIP CART 19.5X105X1.3 (SAW) ×2 IMPLANT
SET HNDPC FAN SPRY TIP SCT (DISPOSABLE) ×1 IMPLANT
STAPLER VISISTAT 35W (STAPLE) IMPLANT
STEM CORAIL KA12 (Stem) ×1 IMPLANT
SUT ETHIBOND 2 V 37 (SUTURE) ×2 IMPLANT
SUT VIC AB 1 CTX 36 (SUTURE) ×1
SUT VIC AB 1 CTX36XBRD ANBCTR (SUTURE) ×1 IMPLANT
SUT VIC AB 2-0 CT1 27 (SUTURE) ×2
SUT VIC AB 2-0 CT1 TAPERPNT 27 (SUTURE) ×2 IMPLANT
SYR 50ML LL SCALE MARK (SYRINGE) ×2 IMPLANT
TOWEL GREEN STERILE (TOWEL DISPOSABLE) ×2 IMPLANT
TRAY CATH 16FR W/PLASTIC CATH (SET/KITS/TRAYS/PACK) IMPLANT
YANKAUER SUCT BULB TIP NO VENT (SUCTIONS) ×2 IMPLANT

## 2018-10-05 NOTE — Progress Notes (Signed)
PROGRESS NOTE    Casey Wood  OZD:[id number] DOB: [birth year] DOA: 10/03/2018 PCP: Hennie Duos, MD   Brief Narrative:  Patient is a 45 female with history of dementia, psychosis, atrial fibrillation, seizures, congestive heart failure was brought to the emergency department at [facility] from pain center nursing home after a fall.  Patient was confused and unable to answer any questions on presentation.  At baseline she ambulates with the help of wheelchair.  She was found to be in A. fib with RVR on presentation.  Imagings done on presentation showed closed displaced fracture of left femoral neck.  Patient transferred to [facility].  Orthopedics planning for ORIF today.  Assessment & Plan:   Principal Problem:   Closed displaced fracture of left femoral neck (HCC) Active Problems:   Atrial fibrillation with RVR (HCC)   Advanced dementia (HCC)   Left femoral neck fracture: Status post fall.  Ambulatory with wheelchair at baseline.  Orthopedics planning for partial hip replacement.  Continue pain management.  A. fib with RVR: Currently on IV cardizem.  Heart rate in the range of 100-110 this morning.  Continue to monitor on telemetry.She is not on any rate control medications at home.  Left humerus age-indeterminate fracture: Orthopedics following.  No plan for intervention  Dementia with psychosis: Confused on baseline.  Continue supportive care.  Continue current medicines  History of congestive heart failure: Currently looks compensated.  Echocardiogram canceled by cardiology.  History of seizures: Not on antiepileptic  agents currently  Hypothyroidism: Continue Synthyroid  Hypertension: Currently blood stable.  Continue current meds         DVT prophylaxis: SCD Code Status: DNR Family Communication: None Disposition Plan: Back to skilled nursing facility   Consultants: Orthopedics, cardiology  Procedures: None  Antimicrobials:  Anti-infectives  (From admission, onward)   Start     Dose/Rate Route Frequency Ordered Stop   10/05/18 1545  ceFAZolin (ANCEF) IVPB 2g/100 mL premix  Status:  Discontinued     2 g 200 mL/hr over 30 Minutes Intravenous On call to O.R. 10/04/18 1409 10/04/18 1824      Subjective:  Patient seen and examined the bedside this morning.  Heart rate in the range of 100-110.  Not in distress.  Blood pressure stable.  Patient confused .Not in distress  Objective: Vitals:   10/04/18 1552 10/04/18 1941 10/04/18 2150 10/05/18 0459  BP:  (!) 114/91 (!) 134/109 114/74  Pulse:  98 (!) 113 98  Resp:  20 20 20   Temp:  98.7 F (37.1 C) 99.2 F (37.3 C) 97.6 F (36.4 C)  TempSrc:  Axillary Oral Axillary  SpO2:  97% 97% 98%  Weight: 44 kg   42 kg  Height: 5' 2.01" (1.575 m)       Intake/Output Summary (Last 24 hours) at 10/05/2018 1402 Last data filed at 10/05/2018 0955 Gross per 24 hour  Intake 263.51 ml  Output 700 ml  Net -436.49 ml   Filed Weights   10/03/18 1752 10/04/18 1552 10/05/18 0459  Weight: 44 kg 44 kg 42 kg    Examination:  General exam: Elderly demented, debilitated female  HEENT: Ear/Nose normal on gross exam Respiratory system: Bilateral equal air entry, normal vesicular breath sounds, no wheezes or crackles  Cardiovascular system: A. fib with RVR no JVD, murmurs, rubs, gallops or clicks. No pedal edema. Gastrointestinal system: Abdomen is nondistended, soft and nontender. No organomegaly or masses felt. Normal bowel sounds heard. Central nervous system: Awake.  Confused  Extremities: No edema, no clubbing ,no cyanosis, distal peripheral pulses palpable. Skin: No rashes, lesions or ulcers,no icterus ,no pallor   Data Reviewed: I have personally reviewed following labs and imaging studies  CBC: Recent Labs  Lab 10/03/18 1758 10/05/18 0513  WBC 8.4 10.2  NEUTROABS 4.9  --   HGB 15.6* 16.1*  HCT 48.0* 47.1*  MCV 99.2 97.7  PLT 160 147   Basic Metabolic Panel: Recent Labs   Lab 10/03/18 1758 10/05/18 0513  NA 142 140  K 4.3 4.0  CL 108 108  CO2 21* 22  GLUCOSE 124* 151*  BUN 30* 20  CREATININE 0.81 0.69  CALCIUM 9.6 9.4   GFR: Estimated Creatinine Clearance: 28.5 mL/min (by C-G formula based on SCr of 0.69 mg/dL). Liver Function Tests: No results for input(s): AST, ALT, ALKPHOS, BILITOT, PROT, ALBUMIN in the last 168 hours. No results for input(s): LIPASE, AMYLASE in the last 168 hours. No results for input(s): AMMONIA in the last 168 hours. Coagulation Profile: No results for input(s): INR, PROTIME in the last 168 hours. Cardiac Enzymes: No results for input(s): CKTOTAL, CKMB, CKMBINDEX, TROPONINI in the last 168 hours. BNP (last 3 results) No results for input(s): PROBNP in the last 8760 hours. HbA1C: No results for input(s): HGBA1C in the last 72 hours. CBG: No results for input(s): GLUCAP in the last 168 hours. Lipid Profile: No results for input(s): CHOL, HDL, LDLCALC, TRIG, CHOLHDL, LDLDIRECT in the last 72 hours. Thyroid Function Tests: No results for input(s): TSH, T4TOTAL, FREET4, T3FREE, THYROIDAB in the last 72 hours. Anemia Panel: No results for input(s): VITAMINB12, FOLATE, FERRITIN, TIBC, IRON, RETICCTPCT in the last 72 hours. Sepsis Labs: No results for input(s): PROCALCITON, LATICACIDVEN in the last 168 hours.  Recent Results (from the past 240 hour(s))  SARS Coronavirus 2 [facility] order, Performed in [facility] hospital lab) Nasopharyngeal Nasopharyngeal Swab     Status: None   Collection Time: 10/03/18  8:10 PM   Specimen: Nasopharyngeal Swab  Result Value Ref Range Status   SARS Coronavirus 2 NEGATIVE NEGATIVE Final    Comment: (NOTE) If result is NEGATIVE SARS-CoV-2 target nucleic acids are NOT DETECTED. The SARS-CoV-2 RNA is generally detectable in upper and lower  respiratory specimens during the acute phase of infection. The lowest  concentration of SARS-CoV-2 viral copies this assay can detect is 250  copies /  mL. A negative result does not preclude SARS-CoV-2 infection  and should not be used as the sole basis for treatment or other  patient management decisions.  A negative result may occur with  improper specimen collection / handling, submission of specimen other  than nasopharyngeal swab, presence of viral mutation(s) within the  areas targeted by this assay, and inadequate number of viral copies  (<250 copies / mL). A negative result must be combined with clinical  observations, patient history, and epidemiological information. If result is POSITIVE SARS-CoV-2 target nucleic acids are DETECTED. The SARS-CoV-2 RNA is generally detectable in upper and lower  respiratory specimens dur ing the acute phase of infection.  Positive  results are indicative of active infection with SARS-CoV-2.  Clinical  correlation with patient history and other diagnostic information is  necessary to determine patient infection status.  Positive results do  not rule out bacterial infection or co-infection with other viruses. If result is PRESUMPTIVE POSTIVE SARS-CoV-2 nucleic acids MAY BE PRESENT.   A presumptive positive result was obtained on the submitted specimen  and confirmed on repeat testing.  While  2019 novel coronavirus  (SARS-CoV-2) nucleic acids may be present in the submitted sample  additional confirmatory testing may be necessary for epidemiological  and / or clinical management purposes  to differentiate between  SARS-CoV-2 and other Sarbecovirus currently known to infect humans.  If clinically indicated additional testing with an alternate test  methodology [phone]) is advised. The SARS-CoV-2 RNA is generally  detectable in upper and lower respiratory sp ecimens during the acute  phase of infection. The expected result is Negative. Fact Sheet for Patients:  StrictlyIdeas.no Fact Sheet for Healthcare Providers: BankingDealers.co.za This test is  not yet approved or cleared by the [place] FDA and has been authorized for detection and/or diagnosis of SARS-CoV-2 by FDA under an Emergency Use Authorization (EUA).  This EUA will remain in effect (meaning this test can be used) for the duration of the COVID-19 declaration under Section 564(b)(1) of the Act, 21 U.S.C. section 360bbb-3(b)(1), unless the authorization is terminated or revoked sooner. Performed at [facility], [address], [place], [place] [address]   Surgical pcr screen     Status: None   Collection Time: 10/04/18  3:32 PM   Specimen: Nasal Mucosa; Nasal Swab  Result Value Ref Range Status   MRSA, PCR NEGATIVE NEGATIVE Final   Staphylococcus aureus NEGATIVE NEGATIVE Final    Comment: (NOTE) The Xpert SA Assay (FDA approved for NASAL specimens in patients 78 years of age and older), is one component of a comprehensive surveillance program. It is not intended to diagnose infection nor to guide or monitor treatment. Performed at [facility], [place] [address], [place], [place] [address]          Radiology Studies: Dg Chest 1 View  Result Date: 10/03/2018 CLINICAL DATA:  Preop, left hip fracture EXAM: CHEST  1 VIEW COMPARISON:  None. FINDINGS: Coarse interstitial changes are likely chronic. No acute consolidation. No convincing evidence of edema. No pneumothorax or effusion. The aorta is calcified and tortuous. The brachiocephalic vessels on the right are somewhat tortuous. Remaining cardiomediastinal contours are unremarkable. Question projectional irregularity versus fracture of the left humerus. Additional degenerative changes are present in the spine and shoulders. IMPRESSION: 1. No active disease.  Likely chronic coarse interstitial changes. 2. Question projectional irregularity versus fracture of the left humerus. Correlate with point tenderness and consider dedicated shoulder radiographs if there is clinical concern. Electronically Signed   By: Lovena Le M.D.   On: 10/03/2018 19:25   Dg Humerus Left  Result Date: 10/03/2018 CLINICAL DATA:  85 year old female with fall and left upper extremity pain. Left humeral fracture seen on the earlier chest radiograph. EXAM: LEFT HUMERUS - 2+ VIEW COMPARISON:  Chest radiograph dated 10/03/2018 FINDINGS: There is an age indeterminate fracture of the left humeral neck with impaction. The bones are osteopenic. There is no dislocation. Evaluation of the left elbow is limited on the provided images. The soft tissues are unremarkable. IMPRESSION: Age indeterminate fracture of the left humeral neck. Clinical correlation is recommended. Electronically Signed   By: Anner Crete M.D.   On: 10/03/2018 23:23   Dg Hip Unilat W Or Wo Pelvis 2-3 Views Left  Result Date: 10/03/2018 CLINICAL DATA:  Left hip pain, fall. Altered mental status EXAM: DG HIP (WITH OR WITHOUT PELVIS) 2-3V LEFT COMPARISON:  None. FINDINGS: Mildly comminuted, foreshortened and slightly valgus angulated transcervical left femoral neck fracture. Femoral head remains normally located. Question remote posttraumatic deformity of the left inferior and superior pubic ramus. No additional acute  osseous abnormalities are identified. Degenerative changes are present in the hips and lower lumbar spine. Bone mineralization is diminished which may limit detection of smaller nondisplaced fractures. IMPRESSION: Mildly comminuted, foreshortened and slightly valgus angulated transcervical left femoral neck fracture. Remote appearing posttraumatic deformity of the left inferior and superior pubic ramus Electronically Signed   By: Lovena Le M.D.   On: 10/03/2018 19:23        Scheduled Meds: . ALPRAZolam  0.25 mg Oral QHS  . busPIRone  5 mg Oral BID  . levothyroxine  50 mcg Oral QAC breakfast  . QUEtiapine  12.5 mg Oral QHS  . QUEtiapine  25 mg Oral q morning - 10a   Continuous Infusions: . diltiazem (CARDIZEM) infusion 10 mg/hr (10/05/18 1020)  .  lactated ringers 10 mL/hr at 10/05/18 0300     LOS: 2 days    Time spent: 35 mins.More than 50% of that time was spent in counseling and/or coordination of care.      Shelly Coss, MD [facility] Pager [phone]  If 7PM-7AM, please contact night-coverage [email or web address] Password [email or web address] 10/05/2018, 2:02 PM

## 2018-10-05 NOTE — H&P (Signed)

## 2018-10-05 NOTE — Anesthesia Procedure Notes (Signed)
Procedure Name: Intubation Date/Time: 10/05/2018 4:43 PM Performed by: Purvis Kilts, CRNA Pre-anesthesia Checklist: Patient identified, Emergency Drugs available, Suction available, Patient being monitored and Timeout performed Patient Re-evaluated:Patient Re-evaluated prior to induction Oxygen Delivery Method: Circle system utilized Preoxygenation: Pre-oxygenation with 100% oxygen Induction Type: IV induction Ventilation: Mask ventilation without difficulty Laryngoscope Size: Mac and 3 Grade View: Grade II Tube type: Oral Tube size: 6.5 mm Number of attempts: 1 Airway Equipment and Method: Stylet Placement Confirmation: ETT inserted through vocal cords under direct vision,  breath sounds checked- equal and bilateral and positive ETCO2 Secured at: 22 cm Tube secured with: Tape

## 2018-10-05 NOTE — Progress Notes (Signed)
Anesthesia made aware that patient was in rapid atrial fibrillation with heart rate fluctuating between 120-145 bpm. Orders received and carried out to increase cardizem drip to 15mg /hr.

## 2018-10-05 NOTE — Anesthesia Preprocedure Evaluation (Addendum)
Anesthesia Evaluation  Patient identified by MRN, date of birth, ID band Patient awake    Reviewed: Allergy & Precautions, NPO status , Patient's Chart, lab work & pertinent test results  History of Anesthesia Complications Negative for: history of anesthetic complications  Airway Mallampati: III  TM Distance: >3 FB     Dental  (+) Dental Advisory Given   Pulmonary neg pulmonary ROS,    breath sounds clear to auscultation       Cardiovascular hypertension, Pt. on medications + dysrhythmias Atrial Fibrillation  Rhythm:Regular Rate:Tachycardia     Neuro/Psych Seizures -,  Schizophrenia Dementia negative psych ROS   GI/Hepatic Neg liver ROS, GERD  ,  Endo/Other  Hypothyroidism   Renal/GU negative Renal ROS  negative genitourinary   Musculoskeletal negative musculoskeletal ROS (+)   Abdominal   Peds negative pediatric ROS (+)  Hematology negative hematology ROS (+)   Anesthesia Other Findings   Reproductive/Obstetrics negative OB ROS                            Anesthesia Physical Anesthesia Plan  ASA: IV  Anesthesia Plan: General   Post-op Pain Management:    Induction:   PONV Risk Score and Plan: Ondansetron and Propofol infusion  Airway Management Planned: Oral ETT  Additional Equipment:   Intra-op Plan:   Post-operative Plan: Possible Post-op intubation/ventilation  Informed Consent: I have reviewed the patients History and Physical, chart, labs and discussed the procedure including the risks, benefits and alternatives for the proposed anesthesia with the patient or authorized representative who has indicated his/her understanding and acceptance.     Consent reviewed with POA  Plan Discussed with: CRNA, Anesthesiologist and Surgeon  Anesthesia Plan Comments:        Anesthesia Quick Evaluation

## 2018-10-05 NOTE — Op Note (Signed)
HEMI HIP ARTHROPLASTY ANTERIOR APPROACH  Procedure Note Casey Wood   [id number]  Pre-op Diagnosis: Left hip fracture     Post-op Diagnosis: same   Operative Procedures  1. Prosthetic replacement for femoral neck fracture. CPT [phone]  Personnel  Surgeon(s): Leandrew Koyanagi, MD  ASSIST: Laure Kidney, RNFA   Anesthesia: general  Prosthesis: Depuy Femur: Corail KA 12 Head: 44 mm size: +1.5 Bearing Type: bipolar  Hip Hemiarthroplasty (Anterior Approach) Op Note:  After informed consent was obtained and the operative extremity marked in the holding area, the patient was brought back to the operating room and placed supine on the HANA table. Next, the operative extremity was prepped and draped in normal sterile fashion. Surgical timeout occurred verifying patient identification, surgical site, surgical procedure and administration of antibiotics.  A modified anterior Smith-Peterson approach to the hip was performed, using the interval between tensor fascia lata and sartorius.  Dissection was carried bluntly down onto the anterior hip capsule. The lateral femoral circumflex vessels were identified and coagulated. A capsulotomy was performed and the capsular flaps tagged for later repair.  Fluoroscopy was utilized to prepare for the femoral neck cut. The neck osteotomy was performed. The femoral head was removed and found a 44 mm head was the appropriate fit.    We then turned our attention to the femur.  After placing the femoral hook, the leg was taken to externally rotated, extended and adducted position taking care to perform soft tissue releases to allow for adequate mobilization of the femur. Soft tissue was cleared from the shoulder of the greater trochanter and the hook elevator used to improve exposure of the proximal femur. Sequential broaching performed up to a size 12. Trial neck and head were placed. The leg was brought back up to neutral and the construct reduced. The position  and sizing of components, offset and leg lengths were checked using fluoroscopy. Stability of the construct was checked in extension and external rotation without any subluxation or impingement of prosthesis. We dislocated the prosthesis, dropped the leg back into position, removed trial components, and irrigated copiously. The final stem and head was then placed, the leg brought back up, the system reduced and fluoroscopy used to verify positioning.  We irrigated, obtained hemostasis and closed the capsule using #2 ethibond suture.  The fascia was closed with #1 vicryl plus, the deep fat layer was closed with 0 vicryl, the subcutaneous layers closed with 2.0 Vicryl Plus and the skin closed with staples. A sterile dressing was applied. The patient was awakened in the operating room and taken to recovery in stable condition. All sponge, needle, and instrument counts were correct at the end of the case.   Position: supine  Complications: see description of procedure.  Time Out: performed   Drains/Packing: none  Estimated blood loss: see anesthesia record  Returned to Recovery Room: in good condition.   Antibiotics: yes   Mechanical VTE (DVT) Prophylaxis: sequential compression devices, TED thigh-high  Chemical VTE (DVT) Prophylaxis: lovenox  Fluid Replacement: Crystalloid: see anesthesia record  Specimens Removed: 1 to pathology   Sponge and Instrument Count Correct? yes   PACU: portable radiograph - low AP   Admission: inpatient status, start PT & OT POD#1  Plan/RTC: Return in 2 weeks for staple removal. Return in 6 weeks to see MD.  Weight Bearing/Load Lower Extremity: full  Hip precautions: none  [name], MD Marga Hoots 6:10 PM   Implant Name Type Inv. Item Serial No.  Manufacturer Lot No. LRB No. Used Action  STEM [employer] - [id number] Stem STEM Teressa Senter  DEPUY SYNTHES [id number] Left 1 Implanted  BIPOLAR PROS AML 44MM - [id number] Hips BIPOLAR PROS AML 44MM   DEPUY SYNTHES [id number] Left 1 Implanted  HIP BALL ARTICU DEPUY - [id number] Hips HIP BALL ARTICU DEPUY  DEPUY SYNTHES [id number] Left 1 Implanted

## 2018-10-05 NOTE — Progress Notes (Addendum)
PHARMACY NOTE:  ANTIMICROBIAL RENAL DOSAGE ADJUSTMENT  Current antimicrobial regimen includes a mismatch between antimicrobial dosage and estimated renal function.  As per policy approved by the Pharmacy & Therapeutics and Medical Executive Committees, the antimicrobial dosage will be adjusted accordingly.  Current antimicrobial dosage:  Cefazolin 2 gm IV Q 6 hrs X 3  Indication: Surgical prophylaxis  Renal Function:  Estimated Creatinine Clearance: 28.5 mL/min (by C-G formula based on SCr of 0.69 mg/dL). []      On intermittent HD, scheduled: []      On CRRT    Antimicrobial dosage has been changed to:  Cefazolin 2 gm IV Q 8 hrs X 2   Thank you for allowing pharmacy to be a part of this patient's care.  Gillermina Hu, PharmD, BCPS, [facility] Clinical Pharmacist 10/05/2018 8:42 PM

## 2018-10-05 NOTE — Transfer of Care (Signed)
Immediate Anesthesia Transfer of Care Note  Patient: Metro Kung Settle  Procedure(s) Performed: HEMI HIP ARTHROPLASTY ANTERIOR APPROACH (Left )  Patient Location: PACU  Anesthesia Type:General  Level of Consciousness: responds to stimulation  Airway & Oxygen Therapy: Patient Spontanous Breathing and Patient connected to face mask oxygen  Post-op Assessment: Report given to RN and Post -op Vital signs reviewed and stable  Post vital signs: Reviewed and stable  Last Vitals:  Vitals Value Taken Time  BP 124/93 10/05/18 1823  Temp    Pulse 123 10/05/18 1823  Resp 23 10/05/18 1826  SpO2 100 % 10/05/18 1823  Vitals shown include unvalidated device data.  Last Pain:  Vitals:   10/05/18 1446  TempSrc: Axillary  PainSc:          Complications: No apparent anesthesia complications

## 2018-10-05 NOTE — Social Work (Signed)
CSW aware pt from [facility], message left with Marianna Fuss in admissions. CSW continuing to follow for support with disposition when medically appropriate.  Westley Hummer, MSW, [place] [employer] [phone]

## 2018-10-06 ENCOUNTER — Encounter (HOSPITAL_COMMUNITY): Payer: Self-pay

## 2018-10-06 DIAGNOSIS — Z515 Encounter for palliative care: Secondary | ICD-10-CM

## 2018-10-06 DIAGNOSIS — Z7189 Other specified counseling: Secondary | ICD-10-CM

## 2018-10-06 LAB — BASIC METABOLIC PANEL
Anion gap: 10 (ref 5–15)
BUN: 15 mg/dL (ref 8–23)
CO2: 24 mmol/L (ref 22–32)
Calcium: 8.8 mg/dL — ABNORMAL LOW (ref 8.9–10.3)
Chloride: 106 mmol/L (ref 98–111)
Creatinine, Ser: 0.66 mg/dL (ref 0.44–1.00)
GFR calc Af Amer: >60 mL/min (ref >60)
GFR calc non Af Amer: >60 mL/min (ref >60)
Glucose, Bld: 134 mg/dL — ABNORMAL HIGH (ref 70–99)
Potassium: 3.8 mmol/L (ref 3.5–5.1)
Sodium: 140 mmol/L (ref 135–145)

## 2018-10-06 LAB — CBC
HCT: 46.1 % — ABNORMAL HIGH (ref 36.0–46.0)
Hemoglobin: 15.3 g/dL — ABNORMAL HIGH (ref 12.0–15.0)
MCH: 32.9 pg (ref 26.0–34.0)
MCHC: 33.2 g/dL (ref 30.0–36.0)
MCV: 99.1 fL (ref 80.0–100.0)
Platelets: 121 10*3/uL — ABNORMAL LOW (ref 150–400)
RBC: 4.65 MIL/uL (ref 3.87–5.11)
RDW: 14.4 % (ref 11.5–15.5)
WBC: 9.9 10*3/uL (ref 4.0–10.5)
nRBC: 0 % (ref 0.0–0.2)

## 2018-10-06 MED ORDER — SODIUM CHLORIDE 0.9 % IV BOLUS
500.0000 mL | Freq: Once | INTRAVENOUS | Status: AC
  Administered 2018-10-06: 500 mL via INTRAVENOUS

## 2018-10-06 MED ORDER — DILTIAZEM HCL 60 MG PO TABS
30.0000 mg | ORAL_TABLET | Freq: Four times a day (QID) | ORAL | Status: DC
  Administered 2018-10-06 – 2018-10-07 (×4): 30 mg via ORAL
  Filled 2018-10-06 (×4): qty 1

## 2018-10-06 MED ORDER — SODIUM CHLORIDE 0.9 % IV SOLN
INTRAVENOUS | Status: DC
  Administered 2018-10-06 – 2018-10-08 (×5): via INTRAVENOUS

## 2018-10-06 NOTE — Progress Notes (Signed)
PROGRESS NOTE    Casey Wood  YPP:[id number] DOB: 1922/08/26 DOA: 10/03/2018 PCP: Hennie Duos, MD   Brief Narrative:  Patient is a 85 female with history of dementia, psychosis, atrial fibrillation, seizures, congestive heart failure was brought to the emergency department at [facility] from pain center nursing home after a fall.  Patient was confused and unable to answer any questions on presentation.  At baseline she ambulates with the help of wheelchair.  She was found to be in A. fib with RVR on presentation.  Imagings done on presentation showed closed displaced fracture of left femoral neck.  Patient transferred to [facility].  Underwent left hip hemiarthroplasty by orthopedics on 10/05/2018.  Hospital course remarkable for A. fib with RVR.  Heart rate currently controlled this morning.   Assessment & Plan:   Principal Problem:   Closed displaced fracture of left femoral neck (HCC) Active Problems:   Atrial fibrillation with RVR (HCC)   Advanced dementia (HCC)   Left femoral neck fracture: Status post fall.  Ambulatory with wheelchair at baseline.    Underwent left hip hemiarthroplasty by orthopedics on 10/05/2018.  Continue pain management.  Continue DVT prophylaxis.  PT/OT evaluation pending.  Continue pain management.  A. fib with RVR: She was started on  IV cardizem.  Heart rate in the range of 70-80 this morning.  Continue to monitor on telemetry.She was not on any rate control medications at home.  Not on anticoagulation.  Started on oral Cardizem.  Left humerus neck age-indeterminate fracture: Orthopedics following.  No plan for intervention  Dementia with psychosis: Confused on baseline.  Continue supportive care.  Continue current medicines  History of congestive heart failure: Currently looks compensated.  Echocardiogram canceled by cardiology.  History of seizures: Not on antiepileptic  agents currently  Hypothyroidism: Continue Synthyroid   Hypertension: Currently blood stable.  Continue current meds  Debility/deconditioning/dementia/multiple comorbidities: Patient is DNR.  Requested palliaitve care consultation.         DVT prophylaxis: Lovenox Code Status: DNR Family Communication: None Disposition Plan: Back to skilled nursing facility after PT/OT evaluation   Consultants: Orthopedics, cardiology  Procedures: None  Antimicrobials:  Anti-infectives (From admission, onward)   Start     Dose/Rate Route Frequency Ordered Stop   10/06/18 0100  ceFAZolin (ANCEF) IVPB 2g/100 mL premix     2 g 200 mL/hr over 30 Minutes Intravenous Every 8 hours 10/05/18 2025 10/06/18 1033   10/05/18 1725  vancomycin (VANCOCIN) powder  Status:  Discontinued       As needed 10/05/18 1725 10/05/18 1809   10/05/18 1545  ceFAZolin (ANCEF) IVPB 2g/100 mL premix  Status:  Discontinued     2 g 200 mL/hr over 30 Minutes Intravenous On call to O.R. 10/04/18 1409 10/04/18 1824      Subjective:  Patient seen and examined the bedside this morning.  Currently hemodynamically stable.  Blood pressure was okay.  Heart rate in the range of 70s-80s.  Patient looked comfortable.  Not agitated.  Denies any complaints but she is demented and did not participate with any meaningful conversation  Objective: Vitals:   10/06/18 0031 10/06/18 0631 10/06/18 0831 10/06/18 0932  BP: (!) 124/93 114/65 120/74 118/77  Pulse: (!) 44 (!) 42 (!) 41 71  Resp: (!) 21 (!) 23 18 (!) 21  Temp:  97.9 F (36.6 C)    TempSrc:  Axillary    SpO2: 98% 100% 100% 99%  Weight:  43 kg    Height:  Intake/Output Summary (Last 24 hours) at 10/06/2018 1159 Last data filed at 10/06/2018 0600 Gross per 24 hour  Intake 1279.22 ml  Output 600 ml  Net 679.22 ml   Filed Weights   10/05/18 0459 10/05/18 1516 10/06/18 0631  Weight: 42 kg 42 kg 43 kg    Examination:  General exam: Elderly demented, debilitated female  HEENT: Ear/Nose normal on gross exam Respiratory  system: Bilateral equal air entry, normal vesicular breath sounds, no wheezes or crackles  Cardiovascular system: A. fib , no JVD, murmurs, rubs, gallops or clicks. No pedal edema. Gastrointestinal system: Abdomen is nondistended, soft and nontender. No organomegaly or masses felt. Normal bowel sounds heard. Central nervous system: Awake.  Confused  Extremities: No edema, no clubbing ,no cyanosis, distal peripheral pulses palpable. Clean surgical wound on the left hip Skin: No rashes, lesions or ulcers,no icterus ,no pallor   Data Reviewed: I have personally reviewed following labs and imaging studies  CBC: Recent Labs  Lab 10/03/18 1758 10/05/18 0513 10/05/18 2057 10/06/18 0412  WBC 8.4 10.2 9.7 9.9  NEUTROABS 4.9  --   --   --   HGB 15.6* 16.1* 15.2* 15.3*  HCT 48.0* 47.1* 45.0 46.1*  MCV 99.2 97.7 98.7 99.1  PLT 160 175 126* 160*   Basic Metabolic Panel: Recent Labs  Lab 10/03/18 1758 10/05/18 0513 10/05/18 2057 10/06/18 0412  NA 142 140  --  140  K 4.3 4.0  --  3.8  CL 108 108  --  106  CO2 21* 22  --  24  GLUCOSE 124* 151*  --  134*  BUN 30* 20  --  15  CREATININE 0.81 0.69 0.87 0.66  CALCIUM 9.6 9.4  --  8.8*   GFR: Estimated Creatinine Clearance: 29.2 mL/min (by C-G formula based on SCr of 0.66 mg/dL). Liver Function Tests: No results for input(s): AST, ALT, ALKPHOS, BILITOT, PROT, ALBUMIN in the last 168 hours. No results for input(s): LIPASE, AMYLASE in the last 168 hours. No results for input(s): AMMONIA in the last 168 hours. Coagulation Profile: No results for input(s): INR, PROTIME in the last 168 hours. Cardiac Enzymes: No results for input(s): CKTOTAL, CKMB, CKMBINDEX, TROPONINI in the last 168 hours. BNP (last 3 results) No results for input(s): PROBNP in the last 8760 hours. HbA1C: No results for input(s): HGBA1C in the last 72 hours. CBG: No results for input(s): GLUCAP in the last 168 hours. Lipid Profile: No results for input(s): CHOL,  HDL, LDLCALC, TRIG, CHOLHDL, LDLDIRECT in the last 72 hours. Thyroid Function Tests: No results for input(s): TSH, T4TOTAL, FREET4, T3FREE, THYROIDAB in the last 72 hours. Anemia Panel: No results for input(s): VITAMINB12, FOLATE, FERRITIN, TIBC, IRON, RETICCTPCT in the last 72 hours. Sepsis Labs: No results for input(s): PROCALCITON, LATICACIDVEN in the last 168 hours.  Recent Results (from the past 240 hour(s))  SARS Coronavirus 2 [facility] order, Performed in [facility] hospital lab) Nasopharyngeal Nasopharyngeal Swab     Status: None   Collection Time: 10/03/18  8:10 PM   Specimen: Nasopharyngeal Swab  Result Value Ref Range Status   SARS Coronavirus 2 NEGATIVE NEGATIVE Final    Comment: (NOTE) If result is NEGATIVE SARS-CoV-2 target nucleic acids are NOT DETECTED. The SARS-CoV-2 RNA is generally detectable in upper and lower  respiratory specimens during the acute phase of infection. The lowest  concentration of SARS-CoV-2 viral copies this assay can detect is 250  copies / mL. A negative result does not preclude SARS-CoV-2 infection  and should not be used as the sole basis for treatment or other  patient management decisions.  A negative result may occur with  improper specimen collection / handling, submission of specimen other  than nasopharyngeal swab, presence of viral mutation(s) within the  areas targeted by this assay, and inadequate number of viral copies  (<250 copies / mL). A negative result must be combined with clinical  observations, patient history, and epidemiological information. If result is POSITIVE SARS-CoV-2 target nucleic acids are DETECTED. The SARS-CoV-2 RNA is generally detectable in upper and lower  respiratory specimens dur ing the acute phase of infection.  Positive  results are indicative of active infection with SARS-CoV-2.  Clinical  correlation with patient history and other diagnostic information is  necessary to determine patient infection  status.  Positive results do  not rule out bacterial infection or co-infection with other viruses. If result is PRESUMPTIVE POSTIVE SARS-CoV-2 nucleic acids MAY BE PRESENT.   A presumptive positive result was obtained on the submitted specimen  and confirmed on repeat testing.  While 2019 novel coronavirus  (SARS-CoV-2) nucleic acids may be present in the submitted sample  additional confirmatory testing may be necessary for epidemiological  and / or clinical management purposes  to differentiate between  SARS-CoV-2 and other Sarbecovirus currently known to infect humans.  If clinically indicated additional testing with an alternate test  methodology [phone]) is advised. The SARS-CoV-2 RNA is generally  detectable in upper and lower respiratory sp ecimens during the acute  phase of infection. The expected result is Negative. Fact Sheet for Patients:  StrictlyIdeas.no Fact Sheet for Healthcare Providers: BankingDealers.co.za This test is not yet approved or cleared by the [place] FDA and has been authorized for detection and/or diagnosis of SARS-CoV-2 by FDA under an Emergency Use Authorization (EUA).  This EUA will remain in effect (meaning this test can be used) for the duration of the COVID-19 declaration under Section 564(b)(1) of the Act, 21 U.S.C. section 360bbb-3(b)(1), unless the authorization is terminated or revoked sooner. Performed at [facility], [address] [place], [place] [address]   Surgical pcr screen     Status: None   Collection Time: 10/04/18  3:32 PM   Specimen: Nasal Mucosa; Nasal Swab  Result Value Ref Range Status   MRSA, PCR NEGATIVE NEGATIVE Final   Staphylococcus aureus NEGATIVE NEGATIVE Final    Comment: (NOTE) The Xpert SA Assay (FDA approved for NASAL specimens in patients 62 years of age and older), is one component of a comprehensive surveillance program. It is not intended to diagnose  infection nor to guide or monitor treatment. Performed at [facility], [place] [address], [place], [place] [address]          Radiology Studies: Pelvis Portable  Result Date: 10/05/2018 CLINICAL DATA:  Postop EXAM: PORTABLE PELVIS 1-2 VIEWS COMPARISON:  10/03/2018 FINDINGS: Pubic symphysis is intact. Chronic fracture deformities of the left superior and inferior pubic rami. Interval left hip replacement with normal alignment and intact hardware. Gas in the soft tissues consistent with recent surgery IMPRESSION: 1. Interval left hip replacement with expected surgical change 2. Chronic deformities of the left superior and inferior pubic rami Electronically Signed   By: Donavan Foil M.D.   On: 10/05/2018 19:36   Dg C-arm 1-60 Min  Result Date: 10/05/2018 CLINICAL DATA:  Left hip replacement EXAM: DG C-ARM 61-120 MIN; OPERATIVE LEFT HIP WITH PELVIS COMPARISON:  10/03/2018 FINDINGS: Two low resolution intraoperative spot views of the  left hip. The images demonstrate a left hip replacement with normal alignment. IMPRESSION: Intraoperative fluoroscopic assistance provided during left hip replacement surgery Electronically Signed   By: Donavan Foil M.D.   On: 10/05/2018 17:53   Dg Hip Operative Unilat W Or W/o Pelvis Left  Result Date: 10/05/2018 CLINICAL DATA:  Left hip replacement EXAM: DG C-ARM 61-120 MIN; OPERATIVE LEFT HIP WITH PELVIS COMPARISON:  10/03/2018 FINDINGS: Two low resolution intraoperative spot views of the left hip. The images demonstrate a left hip replacement with normal alignment. IMPRESSION: Intraoperative fluoroscopic assistance provided during left hip replacement surgery Electronically Signed   By: Donavan Foil M.D.   On: 10/05/2018 17:53        Scheduled Meds: . acetaminophen  500 mg Oral Q6H  . ALPRAZolam  0.25 mg Oral QHS  . busPIRone  5 mg Oral BID  . diltiazem  30 mg Oral Q6H  . docusate sodium  100 mg Oral BID  . enoxaparin (LOVENOX) injection  30 mg  Subcutaneous Q24H  . levothyroxine  50 mcg Oral QAC breakfast  . QUEtiapine  12.5 mg Oral QHS  . QUEtiapine  25 mg Oral q morning - 10a  . tranexamic acid (CYKLOKAPRON) topical -INTRAOP  2,000 mg Topical Once   Continuous Infusions: . lactated ringers 10 mL/hr at 10/05/18 0300  . methocarbamol (ROBAXIN) IV       LOS: 3 days    Time spent: 35 mins.More than 50% of that time was spent in counseling and/or coordination of care.      Shelly Coss, MD [facility] Pager [phone]  If 7PM-7AM, please contact night-coverage [email or web address] Password [email or web address] 10/06/2018, 11:59 AM

## 2018-10-06 NOTE — Anesthesia Postprocedure Evaluation (Signed)
Anesthesia Post Note  Patient: Casey Wood  Procedure(s) Performed: HEMI HIP ARTHROPLASTY ANTERIOR APPROACH (Left )     Patient location during evaluation: PACU Anesthesia Type: Spinal Level of consciousness: awake Pain management: pain level controlled Respiratory status: spontaneous breathing Cardiovascular status: stable Postop Assessment: no apparent nausea or vomiting Anesthetic complications: no    Last Vitals:  Vitals:   10/06/18 1235 10/06/18 1250  BP: 93/63 98/63  Pulse:  61  Resp:  (!) 21  Temp:  36.7 C  SpO2:  100%    Last Pain:  Vitals:   10/06/18 1250  TempSrc: Axillary  PainSc:                  [name]

## 2018-10-06 NOTE — Evaluation (Signed)
Physical Therapy Evaluation Patient Details Name: Casey Wood MRN: [id number] DOB: [birth year] Today's Date: 10/06/2018   History of Present Illness  85 y.o. female admitted on 10/03/18 after fall at SNF with resultant L hip fx s/p L direct anterior THA, WBAT post op.  Pt with significant PMH of TIA, psychosis, dementia, lung CA, HTN, CHF, a-fib, bil TKA, lung lobectomy.    Clinical Impression  Evaluation limited to bed level as second person would be needed for attempts at EOB or OOB to chair.  Pt with baseline dementia, guarded in bil LEs L >R, but did tolerate some gentle ROM and re-positioning.  BPs soft and HR ranged from 108-145 max observed in A-fib during treatment.   PT to follow acutely for deficits listed below.      Follow Up Recommendations SNF(from [facility])    Equipment Recommendations  Wheelchair (measurements PT);Wheelchair cushion (measurements PT);Hospital bed    Recommendations for Other Services   NA    Precautions / Restrictions Precautions Precautions: Fall;None(no hip precautions)      Mobility  Bed Mobility               General bed mobility comments: Pt positioned in chair mode and pillow removed from under her right side as pt's RN reported it was time to reposition.   Transfers                 General transfer comment: unable            Pertinent Vitals/Pain Pain Assessment: Faces Faces Pain Scale: Hurts even more Pain Location: L hip with movement.  Pain Descriptors / Indicators: Grimacing;Guarding Pain Intervention(s): Limited activity within patient's tolerance;Monitored during session;Repositioned    Home Living Family/patient expects to be discharged to:: Skilled nursing facility(from [facility] in [place] per chart)                      Prior Function     Gait / Transfers Assistance Needed: Chart states pt used w/c              Extremity/Trunk Assessment   Upper Extremity Assessment Upper  Extremity Assessment: Defer to OT evaluation    Lower Extremity Assessment Lower Extremity Assessment: RLE deficits/detail;LLE deficits/detail RLE Deficits / Details: right leg held in rigid extension at the knee, pt allowed for gentle R ankle and hip ROM, but I could not get her to release her right knee extension (felt like guarding) to bend the knee and hip any.   LLE Deficits / Details: left leg is very sensative to touch, but with gentle touch I could range her L ankle and hip, again, knee is rigid in extension (guarding).      Cervical / Trunk Assessment Cervical / Trunk Assessment: Kyphotic  Communication   Communication: No difficulties;Other (comment)(baseline dementia, muttering, confabulatory)  Cognition Arousal/Alertness: Awake/alert Behavior During Therapy: Restless Overall Cognitive Status: History of cognitive impairments - at baseline                                        General Comments General comments (skin integrity, edema, etc.): HR max observed 108-145 in A-fib during bed level activities. Pt responded well to music during session.  I played frank sanatra.    Exercises Total Joint Exercises Ankle Circles/Pumps: PROM;Both;10 reps Hip ABduction/ADduction: PROM;Both;10 reps Other Exercises Other Exercises: hip flexion with knee  extended x10 bil PROM.   Assessment/Plan    PT Assessment Patient needs continued PT services  PT Problem List Decreased strength;Decreased range of motion;Decreased activity tolerance;Decreased balance;Decreased mobility;Decreased cognition;Decreased safety awareness;Decreased knowledge of use of DME;Decreased knowledge of precautions;Cardiopulmonary status limiting activity;Pain       PT Treatment Interventions Functional mobility training;Therapeutic activities;Therapeutic exercise;Cognitive remediation;Wheelchair mobility training;Patient/family education;Modalities;Manual techniques    PT Goals (Current goals can  be found in the Care Plan section)  Acute Rehab PT Goals Patient Stated Goal: unable PT Goal Formulation: Patient unable to participate in goal setting Time For Goal Achievement: 10/20/18 Potential to Achieve Goals: Good    Frequency Min 3X/week           AM-PAC PT "6 Clicks" Mobility  Outcome Measure Help needed turning from your back to your side while in a flat bed without using bedrails?: Total Help needed moving from lying on your back to sitting on the side of a flat bed without using bedrails?: Total Help needed moving to and from a bed to a chair (including a wheelchair)?: Total Help needed standing up from a chair using your arms (e.g., wheelchair or bedside chair)?: Total Help needed to walk in hospital room?: Total Help needed climbing 3-5 steps with a railing? : Total 6 Click Score: 6    End of Session   Activity Tolerance: Patient limited by pain;Other (comment)(limited by baseline dementia) Patient left: in bed;with call bell/phone within reach;with bed alarm set Nurse Communication: Mobility status PT Visit Diagnosis: Muscle weakness (generalized) (M62.81);Difficulty in walking, not elsewhere classified (R26.2);Pain Pain - Right/Left: Left Pain - part of body: Hip    Time: 8657-8469 PT Time Calculation (min) (ACUTE ONLY): 26 min   Charges:      Wells Guiles B. Thad Osoria, PT, DPT  Acute Rehabilitation [phone] pager #[phone] office  @ [facility]: [phone]   PT Evaluation $PT Eval Moderate Complexity: 1 Mod PT Treatments $Therapeutic Activity: 8-22 mins      10/06/2018, 6:53 PM

## 2018-10-06 NOTE — Consult Note (Signed)
Consultation Note Date: 10/06/2018   Patient Name: Casey Wood  DOB: December 20, 1922  MRN: [id number]  Age / Sex: 85 y.o., female  PCP: Hennie Duos, MD Referring Physician: Shelly Coss, MD  Reason for Consultation: Establishing goals of care  HPI/Patient Profile: 85 y.o. female  with past medical history of dementia, HOH, a fib, seizures, and CHF admitted on 10/03/2018 with L hip fracture. Underwent prosthetic replacement of femoral neck fracture on 8/13. PMT consulted for [place], hospice evaluation.   Clinical Assessment and Goals of Care: I have reviewed medical records including EPIC notes, labs and imaging, and received report from RN - RN reports patient is comfortable. No concerns.    I attempted to speak with patient's daughter and Gillermo Murdoch; however, she did not answer. Voicemail left with call back number.  Per chart review - patient has a MOST form that was signed by her daughter [name] in December, 2019. Per MOST wishes were outlines as follows: DNR, comfort measures (no transfer to hospital unless comfort needs cannot be met in current location), antibiotics if indicated, and no feeding tube.   Patient is already listed as DNR.   Per social work's note, daughter would like patient to return to SNF. Appears that patient will be admitted under rehab if approved.  By my evaluation, it appears patient would be eligible for hospice services - however, could not receive rehab and hospice together. If she received hospice services at [facility], SNF room and board would likely be private pay.   Suggest patient be followed by palliative at SNF and palliative can assist transition to hospice whenever appropriate/rehab complete.    Primary Decision Maker NEXT OF KIN/HCPOA - daughter [name]    SUMMARY OF RECOMMENDATIONS   - MOST in chart with clear GOC - listed above - unable to get in touch with daughter- voicemail left with callback  number - patient likely eligible for hospice services; however, cannot receive hospice along with rehab - appears that she will be admitted to SNF with rehab - suggest palliative care follow at SNF and can assist with transition to hospice when appropriate/rehab complete  Code Status/Advance Care Planning:  DNR   Symptom Management:   No concerns per RN - patient comfortable  Prognosis:   Unable to determine - poor prognosis r/t advanced dementia, poor functional status, multiple comorbidities  Discharge Planning: [place] for rehab with Palliative care service follow-up      Primary Diagnoses: Present on Admission: . Closed displaced fracture of left femoral neck ([place])   I have reviewed the medical record, interviewed the patient and family, and examined the patient. The following aspects are pertinent.  Past Medical History:  Diagnosis Date  . Alcohol abuse 02/07/2018  . Atrial fibrillation ([place])   . CHF (congestive heart failure) ([place]) 02/07/2018  . Dementia ([place]) 02/07/2018  . GERD (gastroesophageal reflux disease)   . High cholesterol   . Hypertension   . Lung cancer ([place])   . Psychosis ([place]) 04/08/2018  . Seizures ([place]) 02/07/2018  . Thyroid disease   . TIA (transient ischemic attack)    Social History   Socioeconomic History  . Marital status: Widowed    Spouse name: Not on file  . Number of children: Not on file  . Years of education: Not on file  . Highest education level: Not on file  Occupational History  . Not on file  Social Needs  . Financial resource strain: Not on file  . Food  insecurity    Worry: Not on file    Inability: Not on file  . Transportation needs    Medical: Not on file    Non-medical: Not on file  Tobacco Use  . Smoking status: Never Smoker  . Smokeless tobacco: Never Used  Substance and Sexual Activity  . Alcohol use: Not Currently  . Drug use: No  . Sexual activity: Not Currently  Lifestyle  . Physical  activity    Days per week: Not on file    Minutes per session: Not on file  . Stress: Not on file  Relationships  . Social Herbalist on phone: Not on file    Gets together: Not on file    Attends religious service: Not on file    Active member of club or organization: Not on file    Attends meetings of clubs or organizations: Not on file    Relationship status: Not on file  Other Topics Concern  . Not on file  Social History Narrative  . Not on file   Family History  Problem Relation Age of Onset  . Heart attack Father        72s   Scheduled Meds: . acetaminophen  500 mg Oral Q6H  . ALPRAZolam  0.25 mg Oral QHS  . busPIRone  5 mg Oral BID  . docusate sodium  100 mg Oral BID  . enoxaparin (LOVENOX) injection  30 mg Subcutaneous Q24H  . levothyroxine  50 mcg Oral QAC breakfast  . QUEtiapine  12.5 mg Oral QHS  . QUEtiapine  25 mg Oral q morning - 10a  . tranexamic acid (CYKLOKAPRON) topical -INTRAOP  2,000 mg Topical Once   Continuous Infusions: . sodium chloride 75 mL/hr at 10/06/18 1048  . diltiazem (CARDIZEM) infusion 5 mg/hr (10/06/18 0944)  . lactated ringers 10 mL/hr at 10/05/18 0300  . methocarbamol (ROBAXIN) IV     PRN Meds:.acetaminophen, alum & mag hydroxide-simeth, HYDROcodone-acetaminophen, HYDROcodone-acetaminophen, magnesium citrate, menthol-cetylpyridinium **OR** phenol, methocarbamol **OR** methocarbamol (ROBAXIN) IV, metoprolol tartrate, morphine injection, polyethylene glycol, promethazine, sorbitol Allergies  Allergen Reactions  . Levofloxacin     unknown    Vital Signs: BP 118/77   Pulse 71   Temp 97.9 F (36.6 C) (Axillary)   Resp (!) 21   Ht _0  (1.575 m)   Wt 43 kg   SpO2 99%   BMI 17.34 kg/m  Pain Scale: Faces   Pain Score: 0-No pain   SpO2: SpO2: 99 % O2 Device:SpO2: 99 % O2 Flow Rate: .O2 Flow Rate (L/min): 2 L/min  IO: Intake/output summary:   Intake/Output Summary (Last 24 hours) at 10/06/2018 1147 Last data  filed at 10/06/2018 0600 Gross per 24 hour  Intake 1279.22 ml  Output 600 ml  Net 679.22 ml    LBM: Last BM Date: (pta) Baseline Weight: Weight: 44 kg Most recent weight: Weight: 43 kg     Palliative Assessment/Data: PPS 30%    The above conversation was completed via telephone due to the visitor restrictions during the COVID-19 pandemic. Thorough chart review and discussion with necessary members of the care team was completed as part of assessment. All issues were discussed and addressed but no physical exam was performed.  Time Total: 30 minutes Greater than 50%  of this time was spent counseling and coordinating care related to the above assessment and plan.  Juel Burrow, DNP, AGNP-C Palliative Medicine Team [phone] Pager: [phone]

## 2018-10-06 NOTE — Progress Notes (Signed)
   Subjective:  Patient is resting in bed  Objective:   VITALS:   Vitals:   10/05/18 2330 10/06/18 0001 10/06/18 0031 10/06/18 0631  BP: 128/71 133/70 (!) 124/93 114/65  Pulse: 77 85 (!) 44 (!) 42  Resp: 19 (!) 32 (!) 21 (!) 23  Temp:    97.9 F (36.6 C)  TempSrc:    Axillary  SpO2: 100% 100% 98% 100%  Weight:    43 kg  Height:        Intact pulses distally Dorsiflexion/Plantar flexion intact Incision: dressing C/D/I and no drainage No cellulitis present Compartment soft   Lab Results  Component Value Date   WBC 9.9 10/06/2018   HGB 15.3 (H) 10/06/2018   HCT 46.1 (H) 10/06/2018   MCV 99.1 10/06/2018   PLT 121 (L) 10/06/2018     Assessment/Plan:  1 Day Post-Op   - Expected postop acute blood loss anemia - will monitor for symptoms - Up with PT/OT - DVT ppx - SCDs, ambulation, lovenox - WBAT operative extremity - Pain control - SNF pending  Eduard Roux 10/06/2018, 7:58 AM [phone]

## 2018-10-06 NOTE — Social Work (Addendum)
CSW acknowledging consult for SNF placement. Will follow for therapy recommendations. HIPAA compliant message left for pt daughter Clemens Catholic at [phone].   Westley Hummer, MSW, [place] Work [phone]

## 2018-10-06 NOTE — TOC Initial Note (Signed)
Transition of Care [facility]) - Initial/Assessment Note    Patient Details  Name: Casey Wood MRN: [id number] Date of Birth: 1922/12/16  Transition of Care [facility]) CM/SW Contact:    Alexander Mt, [place] Phone Number: 10/06/2018, 11:01 AM  Clinical Narrative:                 CSW spoke with daughter Casey Wood via telephone. Introduced self, role, reason for call. Pt daughter confirms pt from [facility] where they moved her from a SNF in [place] state. Pt daughter eager for pt to return there, [facility] able to accept pt back. We discussed attempting to obtain insurance approval through [facility]. Pt daughter understands if [facility] does not approve pt that she will need to return under private pay. Await PT evaluations to send to [facility] to start authorization.   Aware pt not medically stable at this time for discharge, CSW will follow.   Expected Discharge Plan: [place] Barriers to Discharge: Insurance Authorization, Continued Medical Work up   Patient Goals and CMS Choice Patient states their goals for this hospitalization and ongoing recovery are:: for her to return to [facility] CMS Medicare.gov Compare Post Acute Care list provided to:: Patient Represenative (must comment)(pt daughter Casey Wood) Choice offered to / list presented to : Adult Children  Expected Discharge Plan and Services Expected Discharge Plan: [place] In-house Referral: Clinical Social Work Discharge Planning Services: CM Consult Post Acute Care Choice: Resumption of Svcs/PTA Provider, [place] Living arrangements for the past 2 months: [place]    Prior Living Arrangements/Services Living arrangements for the past 2 months: [place] Lives with:: Facility Resident Patient language and need for interpreter reviewed:: Yes(no needs) Do you feel safe going back to the place where you live?: Yes      Need for  Family Participation in Patient Care: Yes (Comment)(decision making; support) Care giver support system in place?: Yes (comment)(adult children; facility)   Criminal Activity/Legal Involvement Pertinent to Current Situation/Hospitalization: No - Comment as needed  Activities of Daily Living      Permission Sought/Granted Permission sought to share information with : Facility Sport and exercise psychologist, Family Supports(pt disoriented x4)    Share Information with NAME: Clemens Catholic  Permission granted to share info w AGENCY: [facility]  Permission granted to share info w Relationship: daughter  Permission granted to share info w Contact Information: [phone]  Emotional Assessment Appearance:: (telephonic assessment completed with daughter) Attitude/Demeanor/Rapport: (telephonic assessment completed with daughter) Affect (typically observed): (telephonic assessment completed with daughter) Orientation: : Fluctuating Orientation (Suspected and/or reported Sundowners)(disoriented x4) Alcohol / Substance Use: Not Applicable Psych Involvement: No (comment)  Admission diagnosis:  [place] [W19.XXXA] Patient Active Problem List   Diagnosis Date Noted  . Closed displaced fracture of left femoral neck ([place]) 10/03/2018  . Left ankle sprain 06/02/2018  . Chronic anxiety 05/11/2018  . Acquired hypothyroidism 04/25/2018  . Psychosis in elderly with behavioral disturbance ([place]) 04/08/2018  . Atrial fibrillation with RVR ([place]) 02/07/2018  . Advanced dementia ([place]) 02/07/2018  . Anxiety 02/07/2018  . Seizures ([place]) 02/07/2018  . Alcohol abuse 02/07/2018  . CHF (congestive heart failure) ([place]) 02/07/2018   PCP:  Hennie Duos, MD Pharmacy:   [facility] - [address] [place], [place] [address] [place] [address] Phone: [phone] Fax: [phone]  [place] #2 - [address] [place], [place] - [address]  [place]  [address] Phone: [phone] Fax: [phone]     Social Determinants of Health (SDOH) Interventions    Readmission Risk Interventions No flowsheet data found.

## 2018-10-06 NOTE — Progress Notes (Signed)
I notified Dr. Tawanna Solo that blood pressure post bolus was 111 /56 but since bolus has been completed, Blood pressure back down to upper 80s low 90s and heart rate in the 110s to 130s. Order for IVF at 100cc/hr and parameters for PO diltiziem given. Will closely monitor

## 2018-10-06 NOTE — Progress Notes (Signed)
Patients blood pressure has slowly been trending downward in the past few hours. Blood pressure as low as 84/49. I took her off of the Diltizem drip as ordered. Heart rate anywhere from 90s to 120s afib. I notiifed Dr. Tawanna Solo of these finding and he stated to give her a 500cc bolus. I started it. Will closely monitor. Vital signs set for frequency.

## 2018-10-06 NOTE — NC FL2 (Signed)
[place] LEVEL OF CARE SCREENING TOOL     IDENTIFICATION  Patient Name: Casey Wood Birthdate: 24-Nov-1922 Sex: female Admission Date (Current Location): 10/03/2018  [facility] and [place] Number:  [employer] and Address:  [facility] [place] [place] [address] [place], [place] [address]      Provider Number: [id number]  Attending Physician Name and Address:  Shelly Coss, MD  Relative Name and Phone Number:  Clemens Catholic, daughter, [phone]    Current Level of Care: Hospital Recommended Level of Care: [place] Prior Approval Number:    Date Approved/Denied:   PASRR Number: [id number]  Discharge Plan: SNF    Current Diagnoses: Patient Active Problem List   Diagnosis Date Noted  . Closed displaced fracture of left femoral neck ([place]) 10/03/2018  . Left ankle sprain 06/02/2018  . Chronic anxiety 05/11/2018  . Acquired hypothyroidism 04/25/2018  . Psychosis in elderly with behavioral disturbance ([place]) 04/08/2018  . Atrial fibrillation with RVR ([place]) 02/07/2018  . Advanced dementia ([place]) 02/07/2018  . Anxiety 02/07/2018  . Seizures ([place]) 02/07/2018  . Alcohol abuse 02/07/2018  . CHF (congestive heart failure) ([place]) 02/07/2018    Orientation RESPIRATION BLADDER Height & Weight     (disoriented x4; dementia)  O2(2L nasal canula) Incontinent, Indwelling catheter Weight: 94 lb 12.8 oz (43 kg) Height:  5\' 2"  (157.5 cm)  BEHAVIORAL SYMPTOMS/MOOD NEUROLOGICAL BOWEL NUTRITION STATUS      Continent Diet(see discharge summary)  AMBULATORY STATUS COMMUNICATION OF NEEDS Skin   Extensive Assist Verbally Surgical wounds(incision on left hip with hydrocolloid)                       Personal Care Assistance Level of Assistance  Bathing, Dressing, Feeding Bathing Assistance: Maximum assistance Feeding assistance: Maximum assistance Dressing Assistance: Maximum assistance     Functional Limitations Info   Sight, Hearing, Speech Sight Info: Adequate Hearing Info: Adequate Speech Info: Adequate    SPECIAL CARE FACTORS FREQUENCY  OT (By licensed OT), PT (By licensed PT)     PT Frequency: 5x week OT Frequency: 5x week            Contractures Contractures Info: Not present    Additional Factors Info  Code Status, Allergies, Psychotropic Code Status Info: DNR Allergies Info: Levofloxacin Psychotropic Info: ALPRAZolam (XANAX) tablet 0.25 mg daily at bedtime PO; busPIRone (BUSPAR) tablet 5 mg 2x day PO;QUEtiapine (SEROQUEL) tablet 12.5 mg daily at bedtime PO; QUEtiapine (SEROQUEL) tablet 25 mg every morning at 10am         Current Medications (10/06/2018):  This is the current hospital active medication list Current Facility-Administered Medications  Medication Dose Route Frequency Provider Last Rate Last Dose  . 0.9 %  sodium chloride infusion   Intravenous Continuous Leandrew Koyanagi, MD 75 mL/hr at 10/06/18 1048    . acetaminophen (TYLENOL) tablet 325-650 mg  325-650 mg Oral Q6H PRN Leandrew Koyanagi, MD      . acetaminophen (TYLENOL) tablet 500 mg  500 mg Oral Q6H Leandrew Koyanagi, MD   500 mg at 10/06/18 0602  . ALPRAZolam Duanne Moron) tablet 0.25 mg  0.25 mg Oral QHS Leandrew Koyanagi, MD   0.25 mg at 10/04/18 2211  . alum & mag hydroxide-simeth (MAALOX/MYLANTA) 200-200-20 MG/5ML suspension 30 mL  30 mL Oral Q4H PRN Leandrew Koyanagi, MD      . busPIRone (BUSPAR) tablet 5 mg  5 mg Oral BID Erlinda Hong, [place]  M, MD   5 mg at 10/06/18 0953  . diltiazem (CARDIZEM) 100 mg in dextrose 5% 111mL (1 mg/mL) infusion  5-15 mg/hr Intravenous Titrated Leandrew Koyanagi, MD 5 mL/hr at 10/06/18 0944 5 mg/hr at 10/06/18 0944  . docusate sodium (COLACE) capsule 100 mg  100 mg Oral BID Leandrew Koyanagi, MD   100 mg at 10/06/18 0954  . enoxaparin (LOVENOX) injection 30 mg  30 mg Subcutaneous Q24H Garald Balding, MD      . HYDROcodone-acetaminophen (NORCO) 7.5-325 MG per tablet 1-2 tablet  1-2 tablet Oral Q4H PRN Leandrew Koyanagi, MD       . HYDROcodone-acetaminophen (NORCO/VICODIN) 5-325 MG per tablet 1-2 tablet  1-2 tablet Oral Q4H PRN Leandrew Koyanagi, MD      . lactated ringers infusion   Intravenous Continuous Leandrew Koyanagi, MD 10 mL/hr at 10/05/18 0300    . levothyroxine (SYNTHROID) tablet 50 mcg  50 mcg Oral QAC breakfast Leandrew Koyanagi, MD   50 mcg at 10/06/18 0953  . magnesium citrate solution 1 Bottle  1 Bottle Oral Once PRN Leandrew Koyanagi, MD      . menthol-cetylpyridinium (CEPACOL) lozenge 3 mg  1 lozenge Oral PRN Leandrew Koyanagi, MD       Or  . phenol (CHLORASEPTIC) mouth spray 1 spray  1 spray Mouth/Throat PRN Leandrew Koyanagi, MD      . methocarbamol (ROBAXIN) tablet 500 mg  500 mg Oral Q6H PRN Leandrew Koyanagi, MD       Or  . methocarbamol (ROBAXIN) 500 mg in dextrose 5 % 50 mL IVPB  500 mg Intravenous Q6H PRN Leandrew Koyanagi, MD      . metoprolol tartrate (LOPRESSOR) injection 2.5 mg  2.5 mg Intravenous Q6H PRN Leandrew Koyanagi, MD      . morphine 2 MG/ML injection 1 mg  1 mg Intravenous Q2H PRN Leandrew Koyanagi, MD      . polyethylene glycol (MIRALAX / GLYCOLAX) packet 17 g  17 g Oral Daily PRN Leandrew Koyanagi, MD      . promethazine (PHENERGAN) injection 12.5 mg  12.5 mg Intravenous Q6H PRN Garald Balding, MD      . QUEtiapine (SEROQUEL) tablet 12.5 mg  12.5 mg Oral QHS Leandrew Koyanagi, MD   12.5 mg at 10/04/18 2211  . QUEtiapine (SEROQUEL) tablet 25 mg  25 mg Oral q morning - 10a Leandrew Koyanagi, MD   25 mg at 10/06/18 0953  . sorbitol 70 % solution 30 mL  30 mL Oral Daily PRN Leandrew Koyanagi, MD      . tranexamic acid (CYKLOKAPRON) 2,000 mg in sodium chloride 0.9 % 50 mL Topical Application  9,201 mg Topical Once Leandrew Koyanagi, MD         Discharge Medications: Please see discharge summary for a list of discharge medications.  Relevant Imaging Results:  Relevant Lab Results:   Additional Information ss# [id number] [place], [place]

## 2018-10-06 NOTE — Social Work (Signed)
CSW acknowledging consult for palliative to follow at SNF.  Will leave hand off for CSW team to make referral to [facility] closer to d/c.  Westley Hummer, MSW, [place] Work [phone]

## 2018-10-06 NOTE — Care Management Important Message (Signed)
Important Message  Patient Details  Name: Casey Wood MRN: [id number] Date of Birth: August 27, 1922   Medicare Important Message Given:  Yes     [name] 10/06/2018, 12:30 PM

## 2018-10-06 NOTE — Evaluation (Signed)
Occupational Therapy Evaluation Patient Details Name: Casey Wood MRN: [id number] DOB: 05-26-1922 Today's Date: 10/06/2018    History of Present Illness Patient is a 80 female with history of dementia, psychosis, atrial fibrillation, seizures, congestive heart failure was brought to the emergency department at [facility] from pain center nursing home after a fall.  Patient was confused and unable to answer any questions on presentation.  At baseline she ambulates with the help of wheelchair.  She was found to be in A. fib with RVR on presentation.  Imagings done on presentation showed closed displaced fracture of left femoral neck.  Patient transferred to [facility].  Underwent left hip hemiarthroplasty by orthopedics on 10/05/2018.  Hospital course remarkable for A. fib with RVR   Clinical Impression   Pt admitted with above problem list and now presenting to OT with severe cognitive impairments that impact her ability to functionally participate in ADLs. Pt max-total A currently with self care tasks. OT will trial 2x/week services to determine appropriateness for therapy. Medical issues prohibited transfers during today's session.     Follow Up Recommendations  SNF;Supervision/Assistance - 24 hour    Equipment Recommendations  None recommended by OT    Recommendations for Other Services Speech consult;PT consult     Precautions / Restrictions Precautions Precautions: Anterior Hip;Fall Precaution Booklet Issued: No Precaution Comments: Pt unable to carryover edu from baseline dementia Restrictions Weight Bearing Restrictions: No      Mobility Bed Mobility Overal bed mobility: Needs Assistance Bed Mobility: Rolling Rolling: Total assist            Transfers                 General transfer comment: Unable to attempt transfer d/t pt in A-fib    Balance Overall balance assessment: History of Falls                                          ADL either performed or assessed with clinical judgement   ADL Overall ADL's : Needs assistance/impaired Eating/Feeding: Maximal assistance;Supervision/ safety;Bed level Eating/Feeding Details (indicate cue type and reason): recommend pt be provided (S) with meals Grooming: Wash/dry hands;Wash/dry face;Oral care;Maximal assistance Grooming Details (indicate cue type and reason): required HOH to initiate grooming tasks Upper Body Bathing: Maximal assistance;Bed level   Lower Body Bathing: Total assistance;Bed level   Upper Body Dressing : Maximal assistance;Bed level   Lower Body Dressing: Maximal assistance;Bed level   Toilet Transfer: Total assistance;+2 for physical assistance Toilet Transfer Details (indicate cue type and reason): Did not attempt d/t pt in pain and with tachycardia, anticipate pt is a +2 total A           General ADL Comments: Did not attempt functional mobility d/t medical instability      Vision Baseline Vision/History: No visual deficits Patient Visual Report: (Pt unable to state) Vision Assessment?: No apparent visual deficits            Pertinent Vitals/Pain Pain Assessment: Faces Faces Pain Scale: Hurts little more Pain Location: L hip with slight movement Pain Intervention(s): Monitored during session;Limited activity within patient's tolerance        Extremity/Trunk Assessment Upper Extremity Assessment Upper Extremity Assessment: Generalized weakness;Difficult to assess due to impaired cognition   Lower Extremity Assessment Lower Extremity Assessment: Generalized weakness;Difficult to assess due to impaired cognition   Cervical / Trunk  Assessment Cervical / Trunk Assessment: Kyphotic   Communication Communication Communication: Receptive difficulties;Expressive difficulties   Cognition Arousal/Alertness: Awake/alert Behavior During Therapy: Restless Overall Cognitive Status: History of cognitive impairments - at baseline                                  General Comments: Pt unable to communicate accurately. pt not able to follow directions   General Comments  Pt unable to accurately follow directions with multimodal cueing            Home Living Family/patient expects to be discharged to:: Skilled nursing facility                                        Prior Functioning/Environment Level of Independence: Needs assistance  Gait / Transfers Assistance Needed: Chart states pt used w/c ADL's / Homemaking Assistance Needed: Unclear how much assistance pt needed            OT Problem List: Decreased strength;Decreased range of motion;Decreased activity tolerance;Decreased safety awareness;Impaired balance (sitting and/or standing);Decreased coordination;Decreased cognition;Decreased knowledge of use of DME or AE;Decreased knowledge of precautions;Pain;Impaired sensation      OT Treatment/Interventions: Self-care/ADL training;Therapeutic exercise;Energy conservation;Balance training;Therapeutic activities;Cognitive remediation/compensation;Patient/family education;DME and/or AE instruction    OT Goals(Current goals can be found in the care plan section) Acute Rehab OT Goals Patient Stated Goal: unable to state OT Goal Formulation: Patient unable to participate in goal setting Time For Goal Achievement: 10/12/18 Potential to Achieve Goals: Poor  OT Frequency: Min 2X/week    AM-PAC OT "6 Clicks" Daily Activity     Outcome Measure Help from another person eating meals?: A Lot Help from another person taking care of personal grooming?: A Lot Help from another person toileting, which includes using toliet, bedpan, or urinal?: Total Help from another person bathing (including washing, rinsing, drying)?: A Lot Help from another person to put on and taking off regular upper body clothing?: A Lot Help from another person to put on and taking off regular lower body clothing?: Total 6  Click Score: 10   End of Session Nurse Communication: Mobility status  Activity Tolerance: Other (comment);Treatment limited secondary to medical complications (Comment)(treatment limited secondary to cognition) Patient left: in bed;with call bell/phone within reach;with bed alarm set  OT Visit Diagnosis: Unsteadiness on feet (R26.81);Muscle weakness (generalized) (M62.81);Repeated falls (R29.6);Other symptoms and signs involving cognitive function                Time: 1117-1130 OT Time Calculation (min): 13 min Charges:  OT General Charges $OT Visit: 1 Visit OT Evaluation $OT Eval Moderate Complexity: [place] OTR/L  10/06/2018, 12:15 PM

## 2018-10-07 ENCOUNTER — Other Ambulatory Visit: Payer: Self-pay

## 2018-10-07 LAB — CBC
HCT: 39.2 % (ref 36.0–46.0)
Hemoglobin: 13 g/dL (ref 12.0–15.0)
MCH: 33.2 pg (ref 26.0–34.0)
MCHC: 33.2 g/dL (ref 30.0–36.0)
MCV: 100.3 fL — ABNORMAL HIGH (ref 80.0–100.0)
Platelets: 105 10*3/uL — ABNORMAL LOW (ref 150–400)
RBC: 3.91 MIL/uL (ref 3.87–5.11)
RDW: 14.6 % (ref 11.5–15.5)
WBC: 8.7 10*3/uL (ref 4.0–10.5)
nRBC: 0 % (ref 0.0–0.2)

## 2018-10-07 LAB — BASIC METABOLIC PANEL
Anion gap: 6 (ref 5–15)
BUN: 12 mg/dL (ref 8–23)
CO2: 24 mmol/L (ref 22–32)
Calcium: 8.4 mg/dL — ABNORMAL LOW (ref 8.9–10.3)
Chloride: 112 mmol/L — ABNORMAL HIGH (ref 98–111)
Creatinine, Ser: 0.7 mg/dL (ref 0.44–1.00)
GFR calc Af Amer: >60 mL/min (ref >60)
GFR calc non Af Amer: >60 mL/min (ref >60)
Glucose, Bld: 103 mg/dL — ABNORMAL HIGH (ref 70–99)
Potassium: 3.9 mmol/L (ref 3.5–5.1)
Sodium: 142 mmol/L (ref 135–145)

## 2018-10-07 MED ORDER — DILTIAZEM HCL ER COATED BEADS 120 MG PO CP24: 120.0000 mg | ORAL_CAPSULE | Freq: Every day | ORAL | Status: DC

## 2018-10-07 MED ORDER — DILTIAZEM HCL 60 MG PO TABS
60.0000 mg | ORAL_TABLET | Freq: Two times a day (BID) | ORAL | Status: DC
  Administered 2018-10-07 – 2018-10-08 (×3): 60 mg via ORAL
  Filled 2018-10-07 (×3): qty 1

## 2018-10-07 MED ORDER — DOCUSATE SODIUM 50 MG/5ML PO LIQD
100.0000 mg | Freq: Two times a day (BID) | ORAL | Status: DC
  Administered 2018-10-07 – 2018-10-09 (×3): 100 mg via ORAL
  Filled 2018-10-07 (×9): qty 10

## 2018-10-07 NOTE — Plan of Care (Signed)
  Problem: Clinical Measurements: Goal: Ability to maintain clinical measurements within normal limits will improve Outcome: Progressing Goal: Will remain free from infection Outcome: Progressing Goal: Cardiovascular complication will be avoided Outcome: Progressing

## 2018-10-07 NOTE — Plan of Care (Signed)
  Problem: Clinical Measurements: Goal: Ability to maintain clinical measurements within normal limits will improve 10/07/2018 1445 by Sherron Flemings, RN Outcome: Progressing 10/07/2018 1158 by Sherron Flemings, RN Outcome: Progressing Goal: Will remain free from infection Outcome: Progressing Goal: Cardiovascular complication will be avoided Outcome: Progressing

## 2018-10-07 NOTE — Progress Notes (Signed)
PROGRESS NOTE    Casey Wood  NUU:[id number] DOB: 04-Aug-1922 DOA: 10/03/2018 PCP: Hennie Duos, MD   Brief Narrative:  Patient is a 85 female with history of dementia, psychosis, atrial fibrillation, seizures, congestive heart failure was brought to the emergency department at [facility] from [facility] home after a fall.  Patient was confused and unable to answer any questions on presentation.  At baseline she ambulates with the help of wheelchair.  She was found to be in A. fib with RVR on presentation.  Imagings done on presentation showed closed displaced fracture of left femoral neck.  Patient transferred to [facility].  Underwent left hip hemiarthroplasty by orthopedics on 10/05/2018.  Hospital course remarkable for A. fib with RVR.  Heart rate currently controlled this morning. Patient is medically stable for discharge to skilled nursing facility as soon as possible.   Assessment & Plan:   Principal Problem:   Closed displaced fracture of left femoral neck (HCC) Active Problems:   Atrial fibrillation with RVR (HCC)   Advanced dementia (HCC)   Goals of care, counseling/discussion   Palliative care by specialist   Left femoral neck fracture: Status post fall.  Ambulatory with wheelchair at baseline.    Underwent left hip hemiarthroplasty by orthopedics on 10/05/2018.  Continue pain management.  Continue DVT prophylaxis.  PT/OT done and recommended SNF.  Continue pain management.  A. fib with RVR: She was initially started on  IV cardizem.  Heart rate in the range of 70-80 this morning.  Continue to monitor on telemetry.She was not on any rate control medications at home.  Not on anticoagulation.  Started on oral Cardizem.  Left humerus neck  fracture: Orthopedics following.  No plan for intervention  Dementia with psychosis: Confused on baseline.  Continue supportive care.  Continue current medicines  History of congestive heart failure: Currently looks  compensated.  Echocardiogram canceled by cardiology.  History of seizures: Not on antiepileptic  agents currently  Hypothyroidism: Continue Synthyroid  Hypertension: Currently blood stable.  Continue current meds  Debility/deconditioning/dementia/multiple comorbidities: Patient is DNR.  palliaitve care consultation done and recommended to follow up as an outpatient.         DVT prophylaxis: Lovenox Code Status: DNR Family Communication: Talked to daughter on 10/05/18 Disposition Plan: Back to skilled nursing facility as soon as possible.As per SW,not possible on weekend.   Consultants: Orthopedics, cardiology  Procedures: None  Antimicrobials:  Anti-infectives (From admission, onward)   Start     Dose/Rate Route Frequency Ordered Stop   10/06/18 0100  ceFAZolin (ANCEF) IVPB 2g/100 mL premix     2 g 200 mL/hr over 30 Minutes Intravenous Every 8 hours 10/05/18 2025 10/06/18 1033   10/05/18 1725  vancomycin (VANCOCIN) powder  Status:  Discontinued       As needed 10/05/18 1725 10/05/18 1809   10/05/18 1545  ceFAZolin (ANCEF) IVPB 2g/100 mL premix  Status:  Discontinued     2 g 200 mL/hr over 30 Minutes Intravenous On call to O.R. 10/04/18 1409 10/04/18 1824      Subjective:  Patient seen and examined the bedside this morning.  Appeared comfortable.  Heart rate more controlled today.  Not agitated.  Pain looks well controlled.  Objective: Vitals:   10/07/18 0820 10/07/18 1116 10/07/18 1317 10/07/18 1319  BP: 120/72  94/64 (!) 99/56  Pulse: (!) 44 61 100 74  Resp: 16 16    Temp: 98.5 F (36.9 C)  (!) 97.5 F (36.4  C)   TempSrc: Axillary  Oral   SpO2: 100% 100% 96% 96%  Weight:      Height:        Intake/Output Summary (Last 24 hours) at 10/07/2018 1409 Last data filed at 10/07/2018 0700 Gross per 24 hour  Intake 1459.74 ml  Output 375 ml  Net 1084.74 ml   Filed Weights   10/05/18 1516 10/06/18 0631 10/07/18 0430  Weight: 42 kg 43 kg 45.2 kg     Examination:  General exam: Elderly demented, debilitated female  HEENT: Ear/Nose normal on gross exam Respiratory system: Bilateral equal air entry, normal vesicular breath sounds, no wheezes or crackles  Cardiovascular system: A. fib , no JVD, murmurs, rubs, gallops or clicks. No pedal edema. Gastrointestinal system: Abdomen is nondistended, soft and nontender. No organomegaly or masses felt. Normal bowel sounds heard. Central nervous system: Awake.  Confused  Extremities: No edema, no clubbing ,no cyanosis, distal peripheral pulses palpable. Clean surgical wound on the left hip Skin: No rashes, lesions or ulcers,no icterus ,no pallor   Data Reviewed: I have personally reviewed following labs and imaging studies  CBC: Recent Labs  Lab 10/03/18 1758 10/05/18 0513 10/05/18 2057 10/06/18 0412 10/07/18 0347  WBC 8.4 10.2 9.7 9.9 8.7  NEUTROABS 4.9  --   --   --   --   HGB 15.6* 16.1* 15.2* 15.3* 13.0  HCT 48.0* 47.1* 45.0 46.1* 39.2  MCV 99.2 97.7 98.7 99.1 100.3*  PLT 160 175 126* 121* 341*   Basic Metabolic Panel: Recent Labs  Lab 10/03/18 1758 10/05/18 0513 10/05/18 2057 10/06/18 0412 10/07/18 0347  NA 142 140  --  140 142  K 4.3 4.0  --  3.8 3.9  CL 108 108  --  106 112*  CO2 21* 22  --  24 24  GLUCOSE 124* 151*  --  134* 103*  BUN 30* 20  --  15 12  CREATININE 0.81 0.69 0.87 0.66 0.70  CALCIUM 9.6 9.4  --  8.8* 8.4*   GFR: Estimated Creatinine Clearance: 30.7 mL/min (by C-G formula based on SCr of 0.7 mg/dL). Liver Function Tests: No results for input(s): AST, ALT, ALKPHOS, BILITOT, PROT, ALBUMIN in the last 168 hours. No results for input(s): LIPASE, AMYLASE in the last 168 hours. No results for input(s): AMMONIA in the last 168 hours. Coagulation Profile: No results for input(s): INR, PROTIME in the last 168 hours. Cardiac Enzymes: No results for input(s): CKTOTAL, CKMB, CKMBINDEX, TROPONINI in the last 168 hours. BNP (last 3 results) No results for  input(s): PROBNP in the last 8760 hours. HbA1C: No results for input(s): HGBA1C in the last 72 hours. CBG: No results for input(s): GLUCAP in the last 168 hours. Lipid Profile: No results for input(s): CHOL, HDL, LDLCALC, TRIG, CHOLHDL, LDLDIRECT in the last 72 hours. Thyroid Function Tests: No results for input(s): TSH, T4TOTAL, FREET4, T3FREE, THYROIDAB in the last 72 hours. Anemia Panel: No results for input(s): VITAMINB12, FOLATE, FERRITIN, TIBC, IRON, RETICCTPCT in the last 72 hours. Sepsis Labs: No results for input(s): PROCALCITON, LATICACIDVEN in the last 168 hours.  Recent Results (from the past 240 hour(s))  SARS Coronavirus 2 [facility] order, Performed in [facility] hospital lab) Nasopharyngeal Nasopharyngeal Swab     Status: None   Collection Time: 10/03/18  8:10 PM   Specimen: Nasopharyngeal Swab  Result Value Ref Range Status   SARS Coronavirus 2 NEGATIVE NEGATIVE Final    Comment: (NOTE) If result is NEGATIVE SARS-CoV-2 target nucleic  acids are NOT DETECTED. The SARS-CoV-2 RNA is generally detectable in upper and lower  respiratory specimens during the acute phase of infection. The lowest  concentration of SARS-CoV-2 viral copies this assay can detect is 250  copies / mL. A negative result does not preclude SARS-CoV-2 infection  and should not be used as the sole basis for treatment or other  patient management decisions.  A negative result may occur with  improper specimen collection / handling, submission of specimen other  than nasopharyngeal swab, presence of viral mutation(s) within the  areas targeted by this assay, and inadequate number of viral copies  (<250 copies / mL). A negative result must be combined with clinical  observations, patient history, and epidemiological information. If result is POSITIVE SARS-CoV-2 target nucleic acids are DETECTED. The SARS-CoV-2 RNA is generally detectable in upper and lower  respiratory specimens dur ing the acute  phase of infection.  Positive  results are indicative of active infection with SARS-CoV-2.  Clinical  correlation with patient history and other diagnostic information is  necessary to determine patient infection status.  Positive results do  not rule out bacterial infection or co-infection with other viruses. If result is PRESUMPTIVE POSTIVE SARS-CoV-2 nucleic acids MAY BE PRESENT.   A presumptive positive result was obtained on the submitted specimen  and confirmed on repeat testing.  While 2019 novel coronavirus  (SARS-CoV-2) nucleic acids may be present in the submitted sample  additional confirmatory testing may be necessary for epidemiological  and / or clinical management purposes  to differentiate between  SARS-CoV-2 and other Sarbecovirus currently known to infect humans.  If clinically indicated additional testing with an alternate test  methodology [phone]) is advised. The SARS-CoV-2 RNA is generally  detectable in upper and lower respiratory sp ecimens during the acute  phase of infection. The expected result is Negative. Fact Sheet for Patients:  StrictlyIdeas.no Fact Sheet for Healthcare Providers: BankingDealers.co.za This test is not yet approved or cleared by the [place] FDA and has been authorized for detection and/or diagnosis of SARS-CoV-2 by FDA under an Emergency Use Authorization (EUA).  This EUA will remain in effect (meaning this test can be used) for the duration of the COVID-19 declaration under Section 564(b)(1) of the Act, 21 U.S.C. section 360bbb-3(b)(1), unless the authorization is terminated or revoked sooner. Performed at [facility], [address] [place], [place] [address]   Surgical pcr screen     Status: None   Collection Time: 10/04/18  3:32 PM   Specimen: Nasal Mucosa; Nasal Swab  Result Value Ref Range Status   MRSA, PCR NEGATIVE NEGATIVE Final   Staphylococcus aureus NEGATIVE NEGATIVE  Final    Comment: (NOTE) The Xpert SA Assay (FDA approved for NASAL specimens in patients 13 years of age and older), is one component of a comprehensive surveillance program. It is not intended to diagnose infection nor to guide or monitor treatment. Performed at [facility], [place] [address], [place], [place] [address]          Radiology Studies: Pelvis Portable  Result Date: 10/05/2018 CLINICAL DATA:  Postop EXAM: PORTABLE PELVIS 1-2 VIEWS COMPARISON:  10/03/2018 FINDINGS: Pubic symphysis is intact. Chronic fracture deformities of the left superior and inferior pubic rami. Interval left hip replacement with normal alignment and intact hardware. Gas in the soft tissues consistent with recent surgery IMPRESSION: 1. Interval left hip replacement with expected surgical change 2. Chronic deformities of the left superior and inferior pubic rami Electronically Signed  By: Donavan Foil M.D.   On: 10/05/2018 19:36   Dg C-arm 1-60 Min  Result Date: 10/05/2018 CLINICAL DATA:  Left hip replacement EXAM: DG C-ARM 61-120 MIN; OPERATIVE LEFT HIP WITH PELVIS COMPARISON:  10/03/2018 FINDINGS: Two low resolution intraoperative spot views of the left hip. The images demonstrate a left hip replacement with normal alignment. IMPRESSION: Intraoperative fluoroscopic assistance provided during left hip replacement surgery Electronically Signed   By: Donavan Foil M.D.   On: 10/05/2018 17:53   Dg Hip Operative Unilat W Or W/o Pelvis Left  Result Date: 10/05/2018 CLINICAL DATA:  Left hip replacement EXAM: DG C-ARM 61-120 MIN; OPERATIVE LEFT HIP WITH PELVIS COMPARISON:  10/03/2018 FINDINGS: Two low resolution intraoperative spot views of the left hip. The images demonstrate a left hip replacement with normal alignment. IMPRESSION: Intraoperative fluoroscopic assistance provided during left hip replacement surgery Electronically Signed   By: Donavan Foil M.D.   On: 10/05/2018 17:53        Scheduled  Meds: . ALPRAZolam  0.25 mg Oral QHS  . busPIRone  5 mg Oral BID  . diltiazem  120 mg Oral Daily  . docusate  100 mg Oral BID  . enoxaparin (LOVENOX) injection  30 mg Subcutaneous Q24H  . levothyroxine  50 mcg Oral QAC breakfast  . QUEtiapine  12.5 mg Oral QHS  . QUEtiapine  25 mg Oral q morning - 10a  . tranexamic acid (CYKLOKAPRON) topical -INTRAOP  2,000 mg Topical Once   Continuous Infusions: . sodium chloride 100 mL/hr at 10/07/18 0700  . lactated ringers 10 mL/hr at 10/05/18 0300  . methocarbamol (ROBAXIN) IV       LOS: 4 days    Time spent: 35 mins.More than 50% of that time was spent in counseling and/or coordination of care.      Shelly Coss, MD [facility] Pager [phone]  If 7PM-7AM, please contact night-coverage [email or web address] Password [facility] 10/07/2018, 2:09 PM

## 2018-10-07 NOTE — Progress Notes (Signed)
Subjective: 2 Days Post-Op Procedure(s) (LRB): HEMI HIP ARTHROPLASTY ANTERIOR APPROACH (Left) Sleeping but arouses to voice.   Objective: Vital signs in last 24 hours: Temp:  [97.7 F (36.5 C)-98.8 F (37.1 C)] 98.5 F (36.9 C) (08/15 0820) Pulse Rate:  [31-135] 44 (08/15 0820) Resp:  [11-34] 16 (08/15 0820) BP: (84-147)/(49-93) 120/72 (08/15 0820) SpO2:  [85 %-100 %] 100 % (08/15 0820) Weight:  [45.2 kg] 45.2 kg (08/15 0430)  Intake/Output from previous day: 08/14 0701 - 08/15 0700 In: 1579.7 [P.O.:180; I.V.:1399.7] Out: 375 [Urine:375] Intake/Output this shift: No intake/output data recorded.  Recent Labs    10/05/18 0513 10/05/18 2057 10/06/18 0412 10/07/18 0347  HGB 16.1* 15.2* 15.3* 13.0   Recent Labs    10/06/18 0412 10/07/18 0347  WBC 9.9 8.7  RBC 4.65 3.91  HCT 46.1* 39.2  PLT 121* 105*   Recent Labs    10/06/18 0412 10/07/18 0347  NA 140 142  K 3.8 3.9  CL 106 112*  CO2 24 24  BUN 15 12  CREATININE 0.66 0.70  GLUCOSE 134* 103*  CALCIUM 8.8* 8.4*   No results for input(s): LABPT, INR in the last 72 hours.  Left hip C/D/I without drainage.   Assessment/Plan: 2 Days Post-Op Procedure(s) (LRB): HEMI HIP ARTHROPLASTY ANTERIOR APPROACH (Left) Left hip hemi arthroplasty- POD #2- WBAT UP with PT/OT Lovenox for DVT/PE ppx Plan SNF   [facility], PA-C 10/07/2018, 8:58 AM  [facility] [phone]

## 2018-10-07 NOTE — Plan of Care (Signed)

## 2018-10-07 NOTE — Progress Notes (Signed)
Patient bladder scanned and only 65 ml's of urine noted in bladder. Will continue to monitor.

## 2018-10-07 NOTE — Progress Notes (Signed)
No urine output for today with pur wick. Bladder scan done. Vol = 631. Dr. Tawanna Solo notified. IN and Out cath done.

## 2018-10-07 NOTE — Progress Notes (Signed)
Foley cath removed and purewick placed. Will continue to monitor.

## 2018-10-08 ENCOUNTER — Encounter (HOSPITAL_COMMUNITY): Payer: Self-pay | Admitting: Orthopaedic Surgery

## 2018-10-08 LAB — CBC
HCT: 32.8 % — ABNORMAL LOW (ref 36.0–46.0)
Hemoglobin: 10.9 g/dL — ABNORMAL LOW (ref 12.0–15.0)
MCH: 33.3 pg (ref 26.0–34.0)
MCHC: 33.2 g/dL (ref 30.0–36.0)
MCV: 100.3 fL — ABNORMAL HIGH (ref 80.0–100.0)
Platelets: 96 10*3/uL — ABNORMAL LOW (ref 150–400)
RBC: 3.27 MIL/uL — ABNORMAL LOW (ref 3.87–5.11)
RDW: 14.5 % (ref 11.5–15.5)
WBC: 6.2 10*3/uL (ref 4.0–10.5)
nRBC: 0 % (ref 0.0–0.2)

## 2018-10-08 NOTE — Progress Notes (Signed)
PROGRESS NOTE    Casey Wood  FHL:[id number] DOB: 1922-11-20 DOA: 10/03/2018 PCP: Hennie Duos, MD   Brief Narrative:  Patient is a 85 female with history of dementia, psychosis, atrial fibrillation, seizures, congestive heart failure was brought to the emergency department at [facility] from [place] home after a fall.  Patient was confused and unable to answer any questions on presentation.  At baseline she ambulates with the help of wheelchair.  She was found to be in A. fib with RVR on presentation.  Imagings done on presentation showed closed displaced fracture of left femoral neck.  Patient transferred to [facility].  Underwent left hip hemiarthroplasty by orthopedics on 10/05/2018.  Hospital course remarkable for A. fib with RVR.  Heart rate currently controlled this morning. Patient is medically stable for discharge to skilled nursing facility as soon as possible.   Assessment & Plan:   Principal Problem:   Closed displaced fracture of left femoral neck (HCC) Active Problems:   Atrial fibrillation with RVR (HCC)   Advanced dementia (HCC)   Goals of care, counseling/discussion   Palliative care by specialist   Left femoral neck fracture: Status post fall.  Ambulatory with wheelchair at baseline.    Underwent left hip hemiarthroplasty by orthopedics on 10/05/2018.  Continue pain management.   PT/OT done and recommended SNF.    A. fib with RVR: She was initially started on  IV cardizem.  Heart rate in the range of 70-80 this morning.  Continue to monitor on telemetry.She was not on any rate control medications at home.  Not on anticoagulation.  Started on oral Cardizem.  Anemia/thrombocytopenia: Platelets level falling down.  Lovenox will be discontinued.  SCD started for DVT prophylaxis.  Will check with orthopedics for DVT prophylaxis on discharge.  Hemoglobin dropped most likely secondary to surgery.  No need for transfusion.  Left humerus neck  fracture:  Orthopedics following.  No plan for intervention  Dementia with psychosis: Confused on baseline.  Continue supportive care.  Continue current medicines  History of congestive heart failure: Currently looks compensated.  Echocardiogram canceled by cardiology.  History of seizures: Not on antiepileptic  agents currently  Hypothyroidism: Continue Synthyroid  Hypertension: Currently blood stable.  Continue current meds  Debility/deconditioning/dementia/multiple comorbidities: Patient is DNR.  palliaitve care consultation done and recommended to follow up as an outpatient.         DVT prophylaxis: Lovenox Code Status: DNR Family Communication: Talked to daughter on 10/05/18 Disposition Plan: Back to skilled nursing facility as soon as possible.As per SW,not possible on weekend.   Consultants: Orthopedics, cardiology  Procedures: None  Antimicrobials:  Anti-infectives (From admission, onward)   Start     Dose/Rate Route Frequency Ordered Stop   10/06/18 0100  ceFAZolin (ANCEF) IVPB 2g/100 mL premix     2 g 200 mL/hr over 30 Minutes Intravenous Every 8 hours 10/05/18 2025 10/06/18 1033   10/05/18 1725  vancomycin (VANCOCIN) powder  Status:  Discontinued       As needed 10/05/18 1725 10/05/18 1809   10/05/18 1545  ceFAZolin (ANCEF) IVPB 2g/100 mL premix  Status:  Discontinued     2 g 200 mL/hr over 30 Minutes Intravenous On call to O.R. 10/04/18 1409 10/04/18 1824      Subjective:  Patient seen and examined the bedside this morning.  Heart rate in the range of 70-90.  She was in deep sleep this morning.  Breathing looks fine.  Off oxygen.  Objective: Vitals:  10/08/18 0541 10/08/18 0651 10/08/18 0828 10/08/18 1107  BP: 91/75  108/65 132/80  Pulse: 84 63 83 (!) 42  Resp: (!) 22 20 13 15   Temp: 98.6 F (37 C)     TempSrc: Tympanic     SpO2: 96% 96% 99% 98%  Weight: 46 kg     Height:        Intake/Output Summary (Last 24 hours) at 10/08/2018 1235 Last data filed at  10/07/2018 2300 Gross per 24 hour  Intake 1636.64 ml  Output 475 ml  Net 1161.64 ml   Filed Weights   10/06/18 0631 10/07/18 0430 10/08/18 0541  Weight: 43 kg 45.2 kg 46 kg    Examination:  General exam: Elderly demented, debilitated female  HEENT: Ear/Nose normal on gross exam Respiratory system: Bilateral equal air entry, normal vesicular breath sounds, no wheezes or crackles  Cardiovascular system: A. fib , no JVD, murmurs, rubs, gallops or clicks. No pedal edema. Gastrointestinal system: Abdomen is nondistended, soft and nontender. No organomegaly or masses felt. Normal bowel sounds heard. Central nervous system: Sleepy.  Confused  Extremities: No edema, no clubbing ,no cyanosis, distal peripheral pulses palpable. Clean surgical wound on the left hip Skin: No rashes, lesions or ulcers,no icterus ,no pallor   Data Reviewed: I have personally reviewed following labs and imaging studies  CBC: Recent Labs  Lab 10/03/18 1758 10/05/18 0513 10/05/18 2057 10/06/18 0412 10/07/18 0347 10/08/18 0339  WBC 8.4 10.2 9.7 9.9 8.7 6.2  NEUTROABS 4.9  --   --   --   --   --   HGB 15.6* 16.1* 15.2* 15.3* 13.0 10.9*  HCT 48.0* 47.1* 45.0 46.1* 39.2 32.8*  MCV 99.2 97.7 98.7 99.1 100.3* 100.3*  PLT 160 175 126* 121* 105* 96*   Basic Metabolic Panel: Recent Labs  Lab 10/03/18 1758 10/05/18 0513 10/05/18 2057 10/06/18 0412 10/07/18 0347  NA 142 140  --  140 142  K 4.3 4.0  --  3.8 3.9  CL 108 108  --  106 112*  CO2 21* 22  --  24 24  GLUCOSE 124* 151*  --  134* 103*  BUN 30* 20  --  15 12  CREATININE 0.81 0.69 0.87 0.66 0.70  CALCIUM 9.6 9.4  --  8.8* 8.4*   GFR: Estimated Creatinine Clearance: 31.2 mL/min (by C-G formula based on SCr of 0.7 mg/dL). Liver Function Tests: No results for input(s): AST, ALT, ALKPHOS, BILITOT, PROT, ALBUMIN in the last 168 hours. No results for input(s): LIPASE, AMYLASE in the last 168 hours. No results for input(s): AMMONIA in the last 168  hours. Coagulation Profile: No results for input(s): INR, PROTIME in the last 168 hours. Cardiac Enzymes: No results for input(s): CKTOTAL, CKMB, CKMBINDEX, TROPONINI in the last 168 hours. BNP (last 3 results) No results for input(s): PROBNP in the last 8760 hours. HbA1C: No results for input(s): HGBA1C in the last 72 hours. CBG: No results for input(s): GLUCAP in the last 168 hours. Lipid Profile: No results for input(s): CHOL, HDL, LDLCALC, TRIG, CHOLHDL, LDLDIRECT in the last 72 hours. Thyroid Function Tests: No results for input(s): TSH, T4TOTAL, FREET4, T3FREE, THYROIDAB in the last 72 hours. Anemia Panel: No results for input(s): VITAMINB12, FOLATE, FERRITIN, TIBC, IRON, RETICCTPCT in the last 72 hours. Sepsis Labs: No results for input(s): PROCALCITON, LATICACIDVEN in the last 168 hours.  Recent Results (from the past 240 hour(s))  SARS Coronavirus 2 [facility] order, Performed in [facility] hospital lab) Nasopharyngeal Nasopharyngeal  Swab     Status: None   Collection Time: 10/03/18  8:10 PM   Specimen: Nasopharyngeal Swab  Result Value Ref Range Status   SARS Coronavirus 2 NEGATIVE NEGATIVE Final    Comment: (NOTE) If result is NEGATIVE SARS-CoV-2 target nucleic acids are NOT DETECTED. The SARS-CoV-2 RNA is generally detectable in upper and lower  respiratory specimens during the acute phase of infection. The lowest  concentration of SARS-CoV-2 viral copies this assay can detect is 250  copies / mL. A negative result does not preclude SARS-CoV-2 infection  and should not be used as the sole basis for treatment or other  patient management decisions.  A negative result may occur with  improper specimen collection / handling, submission of specimen other  than nasopharyngeal swab, presence of viral mutation(s) within the  areas targeted by this assay, and inadequate number of viral copies  (<250 copies / mL). A negative result must be combined with clinical   observations, patient history, and epidemiological information. If result is POSITIVE SARS-CoV-2 target nucleic acids are DETECTED. The SARS-CoV-2 RNA is generally detectable in upper and lower  respiratory specimens dur ing the acute phase of infection.  Positive  results are indicative of active infection with SARS-CoV-2.  Clinical  correlation with patient history and other diagnostic information is  necessary to determine patient infection status.  Positive results do  not rule out bacterial infection or co-infection with other viruses. If result is PRESUMPTIVE POSTIVE SARS-CoV-2 nucleic acids MAY BE PRESENT.   A presumptive positive result was obtained on the submitted specimen  and confirmed on repeat testing.  While 2019 novel coronavirus  (SARS-CoV-2) nucleic acids may be present in the submitted sample  additional confirmatory testing may be necessary for epidemiological  and / or clinical management purposes  to differentiate between  SARS-CoV-2 and other Sarbecovirus currently known to infect humans.  If clinically indicated additional testing with an alternate test  methodology [phone]) is advised. The SARS-CoV-2 RNA is generally  detectable in upper and lower respiratory sp ecimens during the acute  phase of infection. The expected result is Negative. Fact Sheet for Patients:  StrictlyIdeas.no Fact Sheet for Healthcare Providers: BankingDealers.co.za This test is not yet approved or cleared by the [place] FDA and has been authorized for detection and/or diagnosis of SARS-CoV-2 by FDA under an Emergency Use Authorization (EUA).  This EUA will remain in effect (meaning this test can be used) for the duration of the COVID-19 declaration under Section 564(b)(1) of the Act, 21 U.S.C. section 360bbb-3(b)(1), unless the authorization is terminated or revoked sooner. Performed at [facility], [address],  [place], [place] [address]   Surgical pcr screen     Status: None   Collection Time: 10/04/18  3:32 PM   Specimen: Nasal Mucosa; Nasal Swab  Result Value Ref Range Status   MRSA, PCR NEGATIVE NEGATIVE Final   Staphylococcus aureus NEGATIVE NEGATIVE Final    Comment: (NOTE) The Xpert SA Assay (FDA approved for NASAL specimens in patients 65 years of age and older), is one component of a comprehensive surveillance program. It is not intended to diagnose infection nor to guide or monitor treatment. Performed at [facility], [place] [address], [place],  [address]          Radiology Studies: No results found.      Scheduled Meds: . ALPRAZolam  0.25 mg Oral QHS  . busPIRone  5 mg Oral BID  . diltiazem  60  mg Oral Q12H  . docusate  100 mg Oral BID  . levothyroxine  50 mcg Oral QAC breakfast  . QUEtiapine  12.5 mg Oral QHS  . QUEtiapine  25 mg Oral q morning - 10a  . tranexamic acid (CYKLOKAPRON) topical -INTRAOP  2,000 mg Topical Once   Continuous Infusions: . lactated ringers 10 mL/hr at 10/05/18 0300  . methocarbamol (ROBAXIN) IV       LOS: 5 days    Time spent: 35 mins.More than 50% of that time was spent in counseling and/or coordination of care.      Shelly Coss, MD [facility] Pager [phone]  If 7PM-7AM, please contact night-coverage [email or web address] Password [facility] 10/08/2018, 12:35 PM

## 2018-10-09 LAB — CBC WITH DIFFERENTIAL/PLATELET
Abs Immature Granulocytes: 0.04 10*3/uL (ref 0.00–0.07)
Basophils Absolute: 0 10*3/uL (ref 0.0–0.1)
Basophils Relative: 0 %
Eosinophils Absolute: 0.1 10*3/uL (ref 0.0–0.5)
Eosinophils Relative: 1 %
HCT: 39 % (ref 36.0–46.0)
Hemoglobin: 13.2 g/dL (ref 12.0–15.0)
Immature Granulocytes: 1 %
Lymphocytes Relative: 14 %
Lymphs Abs: 1.1 10*3/uL (ref 0.7–4.0)
MCH: 33.4 pg (ref 26.0–34.0)
MCHC: 33.8 g/dL (ref 30.0–36.0)
MCV: 98.7 fL (ref 80.0–100.0)
Monocytes Absolute: 0.9 10*3/uL (ref 0.1–1.0)
Monocytes Relative: 11 %
Neutro Abs: 5.7 10*3/uL (ref 1.7–7.7)
Neutrophils Relative %: 73 %
Platelets: 135 10*3/uL — ABNORMAL LOW (ref 150–400)
RBC: 3.95 MIL/uL (ref 3.87–5.11)
RDW: 14.3 % (ref 11.5–15.5)
WBC: 7.8 10*3/uL (ref 4.0–10.5)
nRBC: 0 % (ref 0.0–0.2)

## 2018-10-09 MED ORDER — POLYETHYLENE GLYCOL 3350 17 G PO PACK: 17.0000 g | PACK | Freq: Every day | ORAL | 0 refills | Status: DC | PRN

## 2018-10-09 MED ORDER — ORAL CARE MOUTH RINSE
15.0000 mL | Freq: Two times a day (BID) | OROMUCOSAL | Status: DC
  Administered 2018-10-09 – 2018-10-10 (×3): 15 mL via OROMUCOSAL

## 2018-10-09 MED ORDER — DOCUSATE SODIUM 50 MG/5ML PO LIQD: 100.0000 mg | Freq: Two times a day (BID) | ORAL | 0 refills | Status: DC

## 2018-10-09 MED ORDER — ENOXAPARIN SODIUM 30 MG/0.3ML ~~LOC~~ SOLN
30.0000 mg | SUBCUTANEOUS | Status: DC
  Administered 2018-10-09 – 2018-10-10 (×2): 30 mg via SUBCUTANEOUS
  Filled 2018-10-09 (×2): qty 0.3

## 2018-10-09 MED ORDER — DILTIAZEM HCL ER COATED BEADS 240 MG PO CP24
240.0000 mg | ORAL_CAPSULE | Freq: Every day | ORAL | Status: DC
  Administered 2018-10-09: 240 mg via ORAL
  Filled 2018-10-09 (×2): qty 1

## 2018-10-09 MED ORDER — DILTIAZEM HCL ER COATED BEADS 240 MG PO CP24: 240.0000 mg | ORAL_CAPSULE | Freq: Every day | ORAL | Status: DC

## 2018-10-09 MED FILL — Diltiazem HCl IV Soln 125 MG/25ML (5 MG/ML): INTRAVENOUS | Qty: 20 | Status: AC

## 2018-10-09 MED FILL — Dextrose Inj 5%: INTRAVENOUS | Qty: 80 | Status: AC

## 2018-10-09 NOTE — Discharge Instructions (Addendum)
1. Change dressings as needed 2. May shower but keep incisions covered and dry 3. Take lovenox to prevent blood clots 4. Take stool softeners as needed 5. Take pain meds as needed     Atrial Fibrillation  Atrial fibrillation is a type of heartbeat that is irregular or fast (rapid). If you have this condition, your heart beats without any order. This makes it hard for your heart to pump blood in a normal way. Having this condition gives you more risk for stroke, heart failure, and other heart problems. Atrial fibrillation may start all of a sudden and then stop on its own, or it may become a long-lasting problem. What are the causes? This condition may be caused by heart conditions, such as:  High blood pressure.  Heart failure.  Heart valve disease.  Heart surgery. Other causes include:  Pneumonia.  Obstructive sleep apnea.  Lung cancer.  Thyroid disease.  Drinking too much alcohol. Sometimes the cause is not known. What increases the risk? You are more likely to develop this condition if:  You smoke.  You are older.  You have diabetes.  You are overweight.  You have a family history of this condition.  You exercise often and hard. What are the signs or symptoms? Common symptoms of this condition include:  A feeling like your heart is beating very fast.  Chest pain.  Feeling short of breath.  Feeling light-headed or weak.  Getting tired easily. Follow these instructions at home: Medicines  Take over-the-counter and prescription medicines only as told by your doctor.  If your doctor gives you a blood-thinning medicine, take it exactly as told. Taking too much of it can cause bleeding. Taking too little of it does not protect you against clots. Clots can cause a stroke. Lifestyle      Do not use any tobacco products. These include cigarettes, chewing tobacco, and e-cigarettes. If you need help quitting, ask your doctor.  Do not drink  alcohol.  Do not drink beverages that have caffeine. These include coffee, soda, and tea.  Follow diet instructions as told by your doctor.  Exercise regularly as told by your doctor. General instructions  If you have a condition that causes breathing to stop for a short period of time (apnea), treat it as told by your doctor.  Keep a healthy weight. Do not use diet pills unless your doctor says they are safe for you. Diet pills may make heart problems worse.  Keep all follow-up visits as told by your doctor. This is important. Contact a doctor if:  You notice a change in the speed, rhythm, or strength of your heartbeat.  You are taking a blood-thinning medicine and you see more bruising.  You get tired more easily when you move or exercise.  You have a sudden change in weight. Get help right away if:   You have pain in your chest or your belly (abdomen).  You have trouble breathing.  You have blood in your vomit, poop, or pee (urine).  You have any signs of a stroke. "BE FAST" is an easy way to remember the main warning signs: ? B - Balance. Signs are dizziness, sudden trouble walking, or loss of balance. ? E - Eyes. Signs are trouble seeing or a change in how you see. ? F - Face. Signs are sudden weakness or loss of feeling in the face, or the face or eyelid drooping on one side. ? A - Arms. Signs are weakness  or loss of feeling in an arm. This happens suddenly and usually on one side of the body. ? S - Speech. Signs are sudden trouble speaking, slurred speech, or trouble understanding what people say. ? T - Time. Time to call emergency services. Write down what time symptoms started.  You have other signs of a stroke, such as: ? A sudden, very bad headache with no known cause. ? Feeling sick to your stomach (nausea). ? Throwing up (vomiting). ? Jerky movements you cannot control (seizure). These symptoms may be an emergency. Do not wait to see if the symptoms will go  away. Get medical help right away. Call your local emergency services (911 in the [place] Do not drive yourself to the hospital. Summary  Atrial fibrillation is a type of heartbeat that is irregular or fast (rapid).  You are at higher risk of this condition if you smoke, are older, have diabetes, or are overweight.  Follow your doctor's instructions about medicines, diet, exercise, and follow-up visits.  Get help right away if you think that you have signs of a stroke. This information is not intended to replace advice given to you by your health care provider. Make sure you discuss any questions you have with your health care provider. Document Released: 11/18/2007 Document Revised: 04/14/2017 Document Reviewed: 04/01/2017 Elsevier Patient Education  2020 [place] Heart-healthy meal planning includes:  Eating less unhealthy fats.  Eating more healthy fats.  Making other changes in your diet. Talk with your doctor or a diet specialist (dietitian) to create an eating plan that is right for you. What is my plan? Your doctor may recommend an eating plan that includes:  Total fat: ______% or less of total calories a day.  Saturated fat: ______% or less of total calories a day.  Cholesterol: less than _________mg a day. What are tips for following this plan? Cooking Avoid frying your food. Try to bake, boil, grill, or broil it instead. You can also reduce fat by:  Removing the skin from poultry.  Removing all visible fats from meats.  Steaming vegetables in water or broth. Meal planning   At meals, divide your plate into four equal parts: ? Fill one-half of your plate with vegetables and green salads. ? Fill one-fourth of your plate with whole grains. ? Fill one-fourth of your plate with lean protein foods.  Eat 4-5 servings of vegetables per day. A serving of vegetables is: ? 1 cup of raw or cooked vegetables. ? 2 cups of raw leafy  greens.  Eat 4-5 servings of fruit per day. A serving of fruit is: ? 1 medium whole fruit. ?  cup of dried fruit. ?  cup of fresh, frozen, or canned fruit. ?  cup of 100% fruit juice.  Eat more foods that have soluble fiber. These are apples, broccoli, carrots, beans, peas, and barley. Try to get 20-30 g of fiber per day.  Eat 4-5 servings of nuts, legumes, and seeds per week: ? 1 serving of dried beans or legumes equals  cup after being cooked. ? 1 serving of nuts is  cup. ? 1 serving of seeds equals 1 tablespoon. General information  Eat more home-cooked food. Eat less restaurant, buffet, and fast food.  Limit or avoid alcohol.  Limit foods that are high in starch and sugar.  Avoid fried foods.  Lose weight if you are overweight.  Keep track of how much salt (sodium) you eat. This is important if  you have high blood pressure. Ask your doctor to tell you more about this.  Try to add vegetarian meals each week. Fats  Choose healthy fats. These include olive oil and canola oil, flaxseeds, walnuts, almonds, and seeds.  Eat more omega-3 fats. These include salmon, mackerel, sardines, tuna, flaxseed oil, and ground flaxseeds. Try to eat fish at least 2 times each week.  Check food labels. Avoid foods with trans fats or high amounts of saturated fat.  Limit saturated fats. ? These are often found in animal products, such as meats, butter, and cream. ? These are also found in plant foods, such as palm oil, palm kernel oil, and coconut oil.  Avoid foods with partially hydrogenated oils in them. These have trans fats. Examples are stick margarine, some tub margarines, cookies, crackers, and other baked goods. What foods can I eat? Fruits All fresh, canned (in natural juice), or frozen fruits. Vegetables Fresh or frozen vegetables (raw, steamed, roasted, or grilled). Green salads. Grains Most grains. Choose whole wheat and whole grains most of the time. Rice and pasta,  including brown rice and pastas made with whole wheat. Meats and other proteins Lean, well-trimmed beef, veal, pork, and lamb. Chicken and [place] without skin. All fish and shellfish. Wild duck, rabbit, pheasant, and venison. Egg whites or low-cholesterol egg substitutes. Dried beans, peas, lentils, and tofu. Seeds and most nuts. Dairy Low-fat or nonfat cheeses, including ricotta and mozzarella. Skim or 1% milk that is liquid, powdered, or evaporated. Buttermilk that is made with low-fat milk. Nonfat or low-fat yogurt. Fats and oils Non-hydrogenated (trans-free) margarines. Vegetable oils, including soybean, sesame, sunflower, olive, peanut, safflower, corn, canola, and cottonseed. Salad dressings or mayonnaise made with a vegetable oil. Beverages Mineral water. Coffee and tea. Diet carbonated beverages. Sweets and desserts Sherbet, gelatin, and fruit ice. Small amounts of dark chocolate. Limit all sweets and desserts. Seasonings and condiments All seasonings and condiments. The items listed above may not be a complete list of foods and drinks you can eat. Contact a dietitian for more options. What foods should I avoid? Fruits Canned fruit in heavy syrup. Fruit in cream or butter sauce. Fried fruit. Limit coconut. Vegetables Vegetables cooked in cheese, cream, or butter sauce. Fried vegetables. Grains Breads that are made with saturated or trans fats, oils, or whole milk. Croissants. Sweet rolls. Donuts. High-fat crackers, such as cheese crackers. Meats and other proteins Fatty meats, such as hot dogs, ribs, sausage, bacon, rib-eye roast or steak. High-fat deli meats, such as salami and bologna. Caviar. Domestic duck and goose. Organ meats, such as liver. Dairy Cream, sour cream, cream cheese, and creamed cottage cheese. Whole-milk cheeses. Whole or 2% milk that is liquid, evaporated, or condensed. Whole buttermilk. Cream sauce or high-fat cheese sauce. Yogurt that is made from whole  milk. Fats and oils Meat fat, or shortening. Cocoa butter, hydrogenated oils, palm oil, coconut oil, palm kernel oil. Solid fats and shortenings, including bacon fat, salt pork, lard, and butter. Nondairy cream substitutes. Salad dressings with cheese or sour cream. Beverages Regular sodas and juice drinks with added sugar. Sweets and desserts Frosting. Pudding. Cookies. Cakes. Pies. Milk chocolate or white chocolate. Buttered syrups. Full-fat ice cream or ice cream drinks. The items listed above may not be a complete list of foods and drinks to avoid. Contact a dietitian for more information. Summary  Heart-healthy meal planning includes eating less unhealthy fats, eating more healthy fats, and making other changes in your diet.  Eat a balanced diet.  This includes fruits and vegetables, low-fat or nonfat dairy, lean protein, nuts and legumes, whole grains, and heart-healthy oils and fats. This information is not intended to replace advice given to you by your health care provider. Make sure you discuss any questions you have with your health care provider. Document Released: 08/10/2011 Document Revised: 04/14/2017 Document Reviewed: 03/18/2017 Elsevier Patient Education  2020 [employer].

## 2018-10-09 NOTE — Progress Notes (Signed)
Physical Therapy Treatment Patient Details Name: Casey Wood MRN: [id number] DOB: 08-Aug-1922 Today's Date: 10/09/2018    History of Present Illness 85 y.o. female admitted on 10/03/18 after fall at SNF with resultant L hip fx s/p L direct anterior THA, WBAT post op.  Pt with significant PMH of TIA, psychosis, dementia, lung CA, HTN, CHF, a-fib, bil TKA, lung lobectomy.      PT Comments    Pt is pleasantly confused on entry, agreeable to movement. Pt is limited by bilateral LE pain with movement as well as decreased balance and strength. Pt requires maxA to come to EoB. Sat for 6 minutes EoB progressing from maxA to 10 sec of min guard before returning to [place]. Pt initiated LE movement into bed but when that became painful she stopped movement and requires maxA to return to supine. D/c plans remain appropriate.    Follow Up Recommendations  SNF(from [facility])     Equipment Recommendations  Wheelchair (measurements PT);Wheelchair cushion (measurements PT);Hospital bed       Precautions / Restrictions Precautions Precautions: Fall;None(no hip precautions) Restrictions Weight Bearing Restrictions: Yes LLE Weight Bearing: Weight bearing as tolerated    Mobility  Bed Mobility Overal bed mobility: Needs Assistance Bed Mobility: Supine to Sit;Sit to Supine     Supine to sit: Max assist Sit to supine: Max assist   General bed mobility comments: maxA for LE management of LE off bed, trunk to upright and pad scoot of hips to EoB, max A for mangement of LE back into bed and trunk to bed surface  Transfers                 General transfer comment: unable        Balance Overall balance assessment: Needs assistance Sitting-balance support: Feet supported;Bilateral upper extremity supported Sitting balance-Leahy Scale: Zero Sitting balance - Comments: pt generally requires outside assit to maintain balance EoB, able to progress to min guard for 10 seconds prior to  increased posterion lean Postural control: Posterior lean                                  Cognition Arousal/Alertness: Awake/alert Behavior During Therapy: Restless Overall Cognitive Status: History of cognitive impairments - at baseline                                               Pertinent Vitals/Pain Pain Assessment: Faces Faces Pain Scale: Hurts even more Pain Location: B LE with movement Pain Descriptors / Indicators: Grimacing;Guarding    Home Living Family/patient expects to be discharged to:: Skilled nursing facility(from [facility] in [place] per chart)                    Prior Function    Gait / Transfers Assistance Needed: Chart states pt used w/c       PT Goals (current goals can now be found in the care plan section) Acute Rehab PT Goals Patient Stated Goal: unable PT Goal Formulation: Patient unable to participate in goal setting Time For Goal Achievement: 10/20/18 Potential to Achieve Goals: Good Progress towards PT goals: Progressing toward goals    Frequency    Min 3X/week      PT Plan Current plan remains appropriate  AM-PAC PT "6 Clicks" Mobility   Outcome Measure  Help needed turning from your back to your side while in a flat bed without using bedrails?: Total Help needed moving from lying on your back to sitting on the side of a flat bed without using bedrails?: Total Help needed moving to and from a bed to a chair (including a wheelchair)?: Total Help needed standing up from a chair using your arms (e.g., wheelchair or bedside chair)?: Total Help needed to walk in hospital room?: Total Help needed climbing 3-5 steps with a railing? : Total 6 Click Score: 6    End of Session   Activity Tolerance: Patient limited by pain;Other (comment)(limited by baseline dementia) Patient left: in bed;with call bell/phone within reach;with bed alarm set Nurse Communication: Mobility status PT Visit  Diagnosis: Muscle weakness (generalized) (M62.81);Difficulty in walking, not elsewhere classified (R26.2);Pain Pain - Right/Left: Left Pain - part of body: Hip     Time: 1610-9604 PT Time Calculation (min) (ACUTE ONLY): 14 min  Charges:  $Therapeutic Activity: 8-22 mins                     Azzam Mehra B. Migdalia Dk PT, DPT Acute Rehabilitation Services Pager [phone] Office [phone]    [place] 10/09/2018, 5:02 PM

## 2018-10-09 NOTE — Care Management Important Message (Signed)
Important Message  Patient Details  Name: Casey Wood MRN: [id number] Date of Birth: [birth year]   Medicare Important Message Given:  Yes     [name] 10/09/2018, 1:28 PM

## 2018-10-09 NOTE — Discharge Summary (Addendum)
Physician Discharge Summary  ELANORA QUIN VOZ:[id number] DOB: Apr 18, 1922 DOA: 10/03/2018  PCP: Hennie Duos, MD  Admit date: 10/03/2018 Discharge date: 10/10/18 Admitted From: Home Disposition:  Home  Discharge Condition:Stable CODE STATUS: DNR Diet recommendation: Heart Healthy  Brief/Interim Summary: Patient is a 12 female with history of dementia, psychosis, atrial fibrillation, seizures, congestive heart failure was brought to the emergency department at [facility] from [place] home after a fall.  Patient was confused and unable to answer any questions on presentation.  At baseline she ambulates with the help of wheelchair.  She was found to be in A. fib with RVR on presentation.  Imagings done on presentation showed closed displaced fracture of left femoral neck.  Patient transferred to [facility].  Underwent left hip hemiarthroplasty by orthopedics on 10/05/2018.  Hospital course remarkable for A. fib with RVR.  Heart rate currently controlled this morning. Patient is medically stable for discharge back to skilled nursing facility.  Following problems were addressed during her hospitalization:  Left femoral neck fracture: Status post fall.  Ambulatory with wheelchair at baseline.    Underwent left hip hemiarthroplasty by orthopedics on 10/05/2018.  Continue pain management.   PT/OT done and recommended SNF.  Continue lovenox for DVT ppx.Follow up with Dr. Erlinda Hong, orthopedics, as an outpatient 2 weeks  A. fib with RVR: She was initially started on  IV cardizem.  Heart rate in the range of 90-100 this morning.  Continue to monitor on telemetry.She was not on any rate control medications at home.  Not on anticoagulation.  Started on oral Cardizem.  Anemia/thrombocytopenia: Check CBC in a week.  Left humerus neck  fracture: Orthopedics was following.  No plan for intervention  Dementia with psychosis: Confused on baseline.  Continue supportive care.  Continue current  medicines  History of congestive heart failure: Currently looks compensated.   History of seizures: Not on antiepileptic  agents currently  Hypothyroidism: Continue Synthyroid  Hypertension: Currently blood stable.  Continue current meds  Debility/deconditioning/dementia/multiple comorbidities: Patient is DNR.  palliaitve care consultation done and recommended to follow up as an outpatient.  Discharge Diagnoses:  Principal Problem:   Closed displaced fracture of left femoral neck (HCC) Active Problems:   Atrial fibrillation with RVR (HCC)   Advanced dementia (HCC)   Goals of care, counseling/discussion   Palliative care by specialist    Discharge Instructions  Discharge Instructions    Diet - low sodium heart healthy   Complete by: As directed    Discharge instructions   Complete by: As directed    1) Take prescribed medicines as instructed.   Increase activity slowly   Complete by: As directed    Weight bearing as tolerated   Complete by: As directed      Allergies as of 10/09/2018      Reactions   Levofloxacin    unknown      Medication List    TAKE these medications   ALPRAZolam 0.25 MG tablet Commonly known as: XANAX Take 1 tablet (0.25 mg total) by mouth at bedtime.   busPIRone 5 MG tablet Commonly known as: BUSPAR Take 5 mg by mouth 2 (two) times daily.   diltiazem 240 MG 24 hr capsule Commonly known as: CARDIZEM CD Take 1 capsule (240 mg total) by mouth daily.   docusate 50 MG/5ML liquid Commonly known as: COLACE Take 10 mLs (100 mg total) by mouth 2 (two) times daily.   enoxaparin 40 MG/0.4ML injection Commonly known as: LOVENOX  Inject 0.4 mLs (40 mg total) into the skin daily.   HYDROcodone-acetaminophen 5-325 MG tablet Commonly known as: Norco Take 1-2 tablets by mouth 3 (three) times daily as needed.   levothyroxine 50 MCG tablet Commonly known as: SYNTHROID Take 50 mcg by mouth daily before breakfast.   polyethylene glycol 17 g  packet Commonly known as: MIRALAX / GLYCOLAX Take 17 g by mouth daily as needed for mild constipation.   SEROQUEL PO Take 12.5 mg by mouth at bedtime.   QUEtiapine 25 MG tablet Commonly known as: SEROQUEL Take 25 mg by mouth every morning. At 9:00 am            Discharge Care Instructions  (From admission, onward)         Start     Ordered   10/05/18 0000  Weight bearing as tolerated     10/05/18 1812          Contact information for follow-up providers    Leandrew Koyanagi, MD In 2 weeks.   Specialty: Orthopedic Surgery Why: For suture removal, For wound re-check Contact information: [place] [phone]            Contact information for after-discharge care    [facility] .   Service: Skilled Nursing Contact information: [address] [address] [phone]                 Allergies  Allergen Reactions  . Levofloxacin     unknown    Consultations:  orthopedics   Procedures/Studies: Dg Chest 1 View  Result Date: 10/03/2018 CLINICAL DATA:  Preop, left hip fracture EXAM: CHEST  1 VIEW COMPARISON:  None. FINDINGS: Coarse interstitial changes are likely chronic. No acute consolidation. No convincing evidence of edema. No pneumothorax or effusion. The aorta is calcified and tortuous. The brachiocephalic vessels on the right are somewhat tortuous. Remaining cardiomediastinal contours are unremarkable. Question projectional irregularity versus fracture of the left humerus. Additional degenerative changes are present in the spine and shoulders. IMPRESSION: 1. No active disease.  Likely chronic coarse interstitial changes. 2. Question projectional irregularity versus fracture of the left humerus. Correlate with point tenderness and consider dedicated shoulder radiographs if there is clinical concern. Electronically Signed   By: Lovena Le  M.D.   On: 10/03/2018 19:25   Pelvis Portable  Result Date: 10/05/2018 CLINICAL DATA:  Postop EXAM: PORTABLE PELVIS 1-2 VIEWS COMPARISON:  10/03/2018 FINDINGS: Pubic symphysis is intact. Chronic fracture deformities of the left superior and inferior pubic rami. Interval left hip replacement with normal alignment and intact hardware. Gas in the soft tissues consistent with recent surgery IMPRESSION: 1. Interval left hip replacement with expected surgical change 2. Chronic deformities of the left superior and inferior pubic rami Electronically Signed   By: Donavan Foil M.D.   On: 10/05/2018 19:36   Dg Humerus Left  Result Date: 10/03/2018 CLINICAL DATA:  85 year old female with fall and left upper extremity pain. Left humeral fracture seen on the earlier chest radiograph. EXAM: LEFT HUMERUS - 2+ VIEW COMPARISON:  Chest radiograph dated 10/03/2018 FINDINGS: There is an age indeterminate fracture of the left humeral neck with impaction. The bones are osteopenic. There is no dislocation. Evaluation of the left elbow is limited on the provided images. The soft tissues are unremarkable. IMPRESSION: Age indeterminate fracture of the left humeral neck. Clinical correlation is recommended. Electronically Signed   By: Milas Hock  Radparvar M.D.   On: 10/03/2018 23:23   Dg C-arm 1-60 Min  Result Date: 10/05/2018 CLINICAL DATA:  Left hip replacement EXAM: DG C-ARM 61-120 MIN; OPERATIVE LEFT HIP WITH PELVIS COMPARISON:  10/03/2018 FINDINGS: Two low resolution intraoperative spot views of the left hip. The images demonstrate a left hip replacement with normal alignment. IMPRESSION: Intraoperative fluoroscopic assistance provided during left hip replacement surgery Electronically Signed   By: Donavan Foil M.D.   On: 10/05/2018 17:53   Dg Hip Operative Unilat W Or W/o Pelvis Left  Result Date: 10/05/2018 CLINICAL DATA:  Left hip replacement EXAM: DG C-ARM 61-120 MIN; OPERATIVE LEFT HIP WITH PELVIS COMPARISON:   10/03/2018 FINDINGS: Two low resolution intraoperative spot views of the left hip. The images demonstrate a left hip replacement with normal alignment. IMPRESSION: Intraoperative fluoroscopic assistance provided during left hip replacement surgery Electronically Signed   By: Donavan Foil M.D.   On: 10/05/2018 17:53   Dg Hip Unilat W Or Wo Pelvis 2-3 Views Left  Result Date: 10/03/2018 CLINICAL DATA:  Left hip pain, fall. Altered mental status EXAM: DG HIP (WITH OR WITHOUT PELVIS) 2-3V LEFT COMPARISON:  None. FINDINGS: Mildly comminuted, foreshortened and slightly valgus angulated transcervical left femoral neck fracture. Femoral head remains normally located. Question remote posttraumatic deformity of the left inferior and superior pubic ramus. No additional acute osseous abnormalities are identified. Degenerative changes are present in the hips and lower lumbar spine. Bone mineralization is diminished which may limit detection of smaller nondisplaced fractures. IMPRESSION: Mildly comminuted, foreshortened and slightly valgus angulated transcervical left femoral neck fracture. Remote appearing posttraumatic deformity of the left inferior and superior pubic ramus Electronically Signed   By: Lovena Le M.D.   On: 10/03/2018 19:23       Subjective: Patient seen and examined at the bedside this morning.  Stable for discharge to skilled nursing facility.  Discharge Exam: Vitals:   10/09/18 0646 10/09/18 0819  BP: (!) 170/78   Pulse: (!) 104   Resp: (!) 23   Temp: 97.7 F (36.5 C) 98.4 F (36.9 C)  SpO2: 95%    Vitals:   10/09/18 0003 10/09/18 0048 10/09/18 0646 10/09/18 0819  BP: (!) 87/62 101/72 (!) 170/78   Pulse: 62  (!) 104   Resp: 13 16 (!) 23   Temp: 97.8 F (36.6 C)  97.7 F (36.5 C) 98.4 F (36.9 C)  TempSrc: Axillary  Axillary Oral  SpO2: 96%  95%   Weight:   46.5 kg   Height:        General: Pt is alert, awake, not in acute distress Cardiovascular: Afib, S1/S2 +, no  rubs, no gallops Respiratory: CTA bilaterally, no wheezing, no rhonchi Abdominal: Soft, NT, ND, bowel sounds + Extremities: no edema, no cyanosis    The results of significant diagnostics from this hospitalization (including imaging, microbiology, ancillary and laboratory) are listed below for reference.     Microbiology: Recent Results (from the past 240 hour(s))  SARS Coronavirus 2 [facility] order, Performed in [facility] hospital lab) Nasopharyngeal Nasopharyngeal Swab     Status: None   Collection Time: 10/03/18  8:10 PM   Specimen: Nasopharyngeal Swab  Result Value Ref Range Status   SARS Coronavirus 2 NEGATIVE NEGATIVE Final    Comment: (NOTE) If result is NEGATIVE SARS-CoV-2 target nucleic acids are NOT DETECTED. The SARS-CoV-2 RNA is generally detectable in upper and lower  respiratory specimens during the acute phase of infection. The lowest  concentration of SARS-CoV-2 viral  copies this assay can detect is 250  copies / mL. A negative result does not preclude SARS-CoV-2 infection  and should not be used as the sole basis for treatment or other  patient management decisions.  A negative result may occur with  improper specimen collection / handling, submission of specimen other  than nasopharyngeal swab, presence of viral mutation(s) within the  areas targeted by this assay, and inadequate number of viral copies  (<250 copies / mL). A negative result must be combined with clinical  observations, patient history, and epidemiological information. If result is POSITIVE SARS-CoV-2 target nucleic acids are DETECTED. The SARS-CoV-2 RNA is generally detectable in upper and lower  respiratory specimens dur ing the acute phase of infection.  Positive  results are indicative of active infection with SARS-CoV-2.  Clinical  correlation with patient history and other diagnostic information is  necessary to determine patient infection status.  Positive results do  not rule out  bacterial infection or co-infection with other viruses. If result is PRESUMPTIVE POSTIVE SARS-CoV-2 nucleic acids MAY BE PRESENT.   A presumptive positive result was obtained on the submitted specimen  and confirmed on repeat testing.  While 2019 novel coronavirus  (SARS-CoV-2) nucleic acids may be present in the submitted sample  additional confirmatory testing may be necessary for epidemiological  and / or clinical management purposes  to differentiate between  SARS-CoV-2 and other Sarbecovirus currently known to infect humans.  If clinically indicated additional testing with an alternate test  methodology [phone]) is advised. The SARS-CoV-2 RNA is generally  detectable in upper and lower respiratory sp ecimens during the acute  phase of infection. The expected result is Negative. Fact Sheet for Patients:  StrictlyIdeas.no Fact Sheet for Healthcare Providers: BankingDealers.co.za This test is not yet approved or cleared by the [place] FDA and has been authorized for detection and/or diagnosis of SARS-CoV-2 by FDA under an Emergency Use Authorization (EUA).  This EUA will remain in effect (meaning this test can be used) for the duration of the COVID-19 declaration under Section 564(b)(1) of the Act, 21 U.S.C. section 360bbb-3(b)(1), unless the authorization is terminated or revoked sooner. Performed at [facility], [address], [place], [place] [address]   Surgical pcr screen     Status: None   Collection Time: 10/04/18  3:32 PM   Specimen: Nasal Mucosa; Nasal Swab  Result Value Ref Range Status   MRSA, PCR NEGATIVE NEGATIVE Final   Staphylococcus aureus NEGATIVE NEGATIVE Final    Comment: (NOTE) The Xpert SA Assay (FDA approved for NASAL specimens in patients 28 years of age and older), is one component of a comprehensive surveillance program. It is not intended to diagnose infection nor to guide or monitor  treatment. Performed at [facility], [place] [address] [place], [place] [address]      Labs: BNP (last 3 results) No results for input(s): BNP in the last 8760 hours. Basic Metabolic Panel: Recent Labs  Lab 10/03/18 1758 10/05/18 0513 10/05/18 2057 10/06/18 0412 10/07/18 0347  NA 142 140  --  140 142  K 4.3 4.0  --  3.8 3.9  CL 108 108  --  106 112*  CO2 21* 22  --  24 24  GLUCOSE 124* 151*  --  134* 103*  BUN 30* 20  --  15 12  CREATININE 0.81 0.69 0.87 0.66 0.70  CALCIUM 9.6 9.4  --  8.8* 8.4*   Liver Function Tests: No results for input(s): AST,  ALT, ALKPHOS, BILITOT, PROT, ALBUMIN in the last 168 hours. No results for input(s): LIPASE, AMYLASE in the last 168 hours. No results for input(s): AMMONIA in the last 168 hours. CBC: Recent Labs  Lab 10/03/18 1758  10/05/18 2057 10/06/18 0412 10/07/18 0347 10/08/18 0339 10/09/18 0613  WBC 8.4   < > 9.7 9.9 8.7 6.2 7.8  NEUTROABS 4.9  --   --   --   --   --  5.7  HGB 15.6*   < > 15.2* 15.3* 13.0 10.9* 13.2  HCT 48.0*   < > 45.0 46.1* 39.2 32.8* 39.0  MCV 99.2   < > 98.7 99.1 100.3* 100.3* 98.7  PLT 160   < > 126* 121* 105* 96* 135*   < > = values in this interval not displayed.   Cardiac Enzymes: No results for input(s): CKTOTAL, CKMB, CKMBINDEX, TROPONINI in the last 168 hours. BNP: Invalid input(s): POCBNP CBG: No results for input(s): GLUCAP in the last 168 hours. D-Dimer No results for input(s): DDIMER in the last 72 hours. Hgb A1c No results for input(s): HGBA1C in the last 72 hours. Lipid Profile No results for input(s): CHOL, HDL, LDLCALC, TRIG, CHOLHDL, LDLDIRECT in the last 72 hours. Thyroid function studies No results for input(s): TSH, T4TOTAL, T3FREE, THYROIDAB in the last 72 hours.  Invalid input(s): FREET3 Anemia work up No results for input(s): VITAMINB12, FOLATE, FERRITIN, TIBC, IRON, RETICCTPCT in the last 72 hours. Urinalysis No results found for: COLORURINE, APPEARANCEUR, LABSPEC,  [place], GLUCOSEU, [place], BILIRUBINUR, KETONESUR, PROTEINUR, UROBILINOGEN, NITRITE, LEUKOCYTESUR Sepsis Labs Invalid input(s): PROCALCITONIN,  WBC,  [place] Microbiology Recent Results (from the past 240 hour(s))  SARS Coronavirus 2 [facility] order, Performed in [facility] hospital lab) Nasopharyngeal Nasopharyngeal Swab     Status: None   Collection Time: 10/03/18  8:10 PM   Specimen: Nasopharyngeal Swab  Result Value Ref Range Status   SARS Coronavirus 2 NEGATIVE NEGATIVE Final    Comment: (NOTE) If result is NEGATIVE SARS-CoV-2 target nucleic acids are NOT DETECTED. The SARS-CoV-2 RNA is generally detectable in upper and lower  respiratory specimens during the acute phase of infection. The lowest  concentration of SARS-CoV-2 viral copies this assay can detect is 250  copies / mL. A negative result does not preclude SARS-CoV-2 infection  and should not be used as the sole basis for treatment or other  patient management decisions.  A negative result may occur with  improper specimen collection / handling, submission of specimen other  than nasopharyngeal swab, presence of viral mutation(s) within the  areas targeted by this assay, and inadequate number of viral copies  (<250 copies / mL). A negative result must be combined with clinical  observations, patient history, and epidemiological information. If result is POSITIVE SARS-CoV-2 target nucleic acids are DETECTED. The SARS-CoV-2 RNA is generally detectable in upper and lower  respiratory specimens dur ing the acute phase of infection.  Positive  results are indicative of active infection with SARS-CoV-2.  Clinical  correlation with patient history and other diagnostic information is  necessary to determine patient infection status.  Positive results do  not rule out bacterial infection or co-infection with other viruses. If result is PRESUMPTIVE POSTIVE SARS-CoV-2 nucleic acids MAY BE PRESENT.   A presumptive positive  result was obtained on the submitted specimen  and confirmed on repeat testing.  While 2019 novel coronavirus  (SARS-CoV-2) nucleic acids may be present in the submitted sample  additional confirmatory testing may be necessary for epidemiological  and / or clinical management purposes  to differentiate between  SARS-CoV-2 and other Sarbecovirus currently known to infect humans.  If clinically indicated additional testing with an alternate test  methodology [phone]) is advised. The SARS-CoV-2 RNA is generally  detectable in upper and lower respiratory sp ecimens during the acute  phase of infection. The expected result is Negative. Fact Sheet for Patients:  StrictlyIdeas.no Fact Sheet for Healthcare Providers: BankingDealers.co.za This test is not yet approved or cleared by the [place] FDA and has been authorized for detection and/or diagnosis of SARS-CoV-2 by FDA under an Emergency Use Authorization (EUA).  This EUA will remain in effect (meaning this test can be used) for the duration of the COVID-19 declaration under Section 564(b)(1) of the Act, 21 U.S.C. section 360bbb-3(b)(1), unless the authorization is terminated or revoked sooner. Performed at [facility], [address] [place], [place] [address]   Surgical pcr screen     Status: None   Collection Time: 10/04/18  3:32 PM   Specimen: Nasal Mucosa; Nasal Swab  Result Value Ref Range Status   MRSA, PCR NEGATIVE NEGATIVE Final   Staphylococcus aureus NEGATIVE NEGATIVE Final    Comment: (NOTE) The Xpert SA Assay (FDA approved for NASAL specimens in patients 69 years of age and older), is one component of a comprehensive surveillance program. It is not intended to diagnose infection nor to guide or monitor treatment. Performed at [facility], [place] [address], [place], [place] [address]     Please note: You were cared for by a hospitalist during your hospital stay.  Once you are discharged, your primary care physician will handle any further medical issues. Please note that NO REFILLS for any discharge medications will be authorized once you are discharged, as it is imperative that you return to your primary care physician (or establish a relationship with a primary care physician if you do not have one) for your post hospital discharge needs so that they can reassess your need for medications and monitor your lab values.    Time coordinating discharge: 40 minutes  SIGNED:   Shelly Coss, MD  [facility] 10/09/2018, 10:18 AM Pager [phone]  If 7PM-7AM, please contact night-coverage [email or web address] Password [email or web address]

## 2018-10-09 NOTE — Plan of Care (Signed)
  Problem: Pain Managment: Goal: General experience of comfort will improve Outcome: Progressing  Ice pack to L hip-using Faces pain scale pain is controlled at this time Problem: Elimination: Goal: Will not experience complications related to urinary retention Outcome: Not Progressing Foley catheter replaced 8/16 due to acute retention-failed voiding trial   Problem: Skin Integrity: Goal: Risk for impaired skin integrity will decrease Outcome: Not Met (add Reason) Pt is not compliant with turning Q2 hours-she immediately scoots back to her back.

## 2018-10-09 NOTE — TOC Progression Note (Signed)
Transition of Care [facility]) - Progression Note    Patient Details  Name: Casey Wood MRN: [id number] Date of Birth: Apr 14, 1922  [facility] CM/SW Contact  Bartholomew Crews, RN Phone Number: [phone] 10/09/2018, 10:20 AM  Clinical Narrative:    Received call from MD that patient was medically stable for discharge. Telephone call to Tammy at [facility] - authorization is still pending.    Expected Discharge Plan: Skilled Nursing Facility Barriers to Discharge: Insurance Authorization  Expected Discharge Plan and Services Expected Discharge Plan: [place] In-house Referral: Clinical Social Work Discharge Planning Services: CM Consult Post Acute Care Choice: Resumption of Svcs/PTA Provider, [place] Living arrangements for the past 2 months: [place] Expected Discharge Date: 10/09/18                                     Social Determinants of Health (SDOH) Interventions    Readmission Risk Interventions No flowsheet data found.

## 2018-10-10 ENCOUNTER — Non-Acute Institutional Stay (SKILLED_NURSING_FACILITY): Payer: Medicare HMO | Admitting: Adult Health

## 2018-10-10 ENCOUNTER — Encounter: Payer: Self-pay | Admitting: Adult Health

## 2018-10-10 ENCOUNTER — Inpatient Hospital Stay
Admission: RE | Admit: 2018-10-10 | Discharge: 2019-02-11 | Disposition: A | Discharge status: Nursing Home (Includes ICF) | Payer: Medicare HMO | Source: Ambulatory Visit | Attending: Internal Medicine | Admitting: Internal Medicine

## 2018-10-10 DIAGNOSIS — F419 Anxiety disorder, unspecified: Secondary | ICD-10-CM

## 2018-10-10 DIAGNOSIS — F03C0 Unspecified dementia, severe, without behavioral disturbance, psychotic disturbance, mood disturbance, and anxiety: Secondary | ICD-10-CM

## 2018-10-10 DIAGNOSIS — E039 Hypothyroidism, unspecified: Secondary | ICD-10-CM

## 2018-10-10 DIAGNOSIS — S72002A Fracture of unspecified part of neck of left femur, initial encounter for closed fracture: Secondary | ICD-10-CM | POA: Diagnosis not present

## 2018-10-10 DIAGNOSIS — R569 Unspecified convulsions: Secondary | ICD-10-CM

## 2018-10-10 DIAGNOSIS — S42215S Unspecified nondisplaced fracture of surgical neck of left humerus, sequela: Secondary | ICD-10-CM

## 2018-10-10 DIAGNOSIS — F03918 Unspecified dementia, unspecified severity, with other behavioral disturbance: Secondary | ICD-10-CM

## 2018-10-10 DIAGNOSIS — I509 Heart failure, unspecified: Secondary | ICD-10-CM | POA: Diagnosis not present

## 2018-10-10 DIAGNOSIS — F0391 Unspecified dementia with behavioral disturbance: Secondary | ICD-10-CM

## 2018-10-10 DIAGNOSIS — I4891 Unspecified atrial fibrillation: Secondary | ICD-10-CM

## 2018-10-10 DIAGNOSIS — F0390 Unspecified dementia without behavioral disturbance: Secondary | ICD-10-CM

## 2018-10-10 MED ORDER — DILTIAZEM HCL 90 MG PO TABS: 90.0000 mg | ORAL_TABLET | Freq: Two times a day (BID) | ORAL | Status: DC

## 2018-10-10 MED ORDER — DILTIAZEM HCL 60 MG PO TABS
90.0000 mg | ORAL_TABLET | Freq: Two times a day (BID) | ORAL | Status: DC
  Administered 2018-10-10: 90 mg via ORAL
  Filled 2018-10-10: qty 2

## 2018-10-10 NOTE — TOC Progression Note (Addendum)
Transition of Care [facility]) - Progression Note    Patient Details  Name: Casey Wood MRN: [id number] Date of Birth: Nov 09, 1922  [facility] CM/SW Contact  Eileen Stanford, LCSW Phone Number: 10/10/2018, 11:07 AM  Clinical Narrative:   [facility] denied insurance coverage for therapies however pt lives at [facility] and can return. MD has decided not to do appeal. CSW notified patient's daughter [name].   Expected Discharge Plan: Skilled Nursing Facility Barriers to Discharge: Insurance Authorization  Expected Discharge Plan and Services Expected Discharge Plan: [place] In-house Referral: Clinical Social Work Discharge Planning Services: CM Consult Post Acute Care Choice: Resumption of Svcs/PTA Provider, [place] Living arrangements for the past 2 months: [place] Expected Discharge Date: 10/10/18                                     Social Determinants of Health (SDOH) Interventions    Readmission Risk Interventions No flowsheet data found.

## 2018-10-10 NOTE — TOC Transition Note (Signed)
Transition of Care [facility]) - CM/SW Discharge Note   Patient Details  Name: Casey Wood MRN: [id number] Date of Birth: 12-09-1922  Transition of Care [facility] CM/SW Contact:  Eileen Stanford, LCSW Phone Number: 10/10/2018, 11:22 AM   Clinical Narrative:    Clinical Social Worker facilitated patient discharge including contacting patient family and facility to confirm patient discharge plans.  Clinical information faxed to facility and family agreeable with plan.  CSW arranged ambulance transport via [facility] to [facility].  RN to call [phone] for report prior to discharge.   Final next level of care: [place] Barriers to Discharge: No Barriers Identified   Patient Goals and CMS Choice Patient states their goals for this hospitalization and ongoing recovery are:: for her to return to [facility] CMS Medicare.gov Compare Post Acute Care list provided to:: Patient Represenative (must comment)(pt daughter [name]) Choice offered to / list presented to : Adult Children  Discharge Placement              Patient chooses bed at: [facility] Patient to be transferred to facility by: [place] Name of family member notified: [name] Patient and family notified of of transfer: 10/10/18  Discharge Plan and Services In-house Referral: Clinical Social Work Discharge Planning Services: CM Consult Post Acute Care Choice: Resumption of Product/process development scientist, [place]                               Social Determinants of Health (SDOH) Interventions     Readmission Risk Interventions No flowsheet data found.

## 2018-10-10 NOTE — Progress Notes (Signed)
PROGRESS NOTE    Casey Wood  TZG:[id number] DOB: 12-Jun-1922 DOA: 10/03/2018 PCP: Hennie Duos, MD   Brief Narrative:  Patient is a 85 female with history of dementia, psychosis, atrial fibrillation, seizures, congestive heart failure was brought to the emergency department at [facility] from [place] home after a fall.  Patient was confused and unable to answer any questions on presentation.  At baseline she ambulates with the help of wheelchair.  She was found to be in A. fib with RVR on presentation.  Imagings done on presentation showed closed displaced fracture of left femoral neck.  Patient transferred to [facility].  Underwent left hip hemiarthroplasty by orthopedics on 10/05/2018.  Hospital course remarkable for A. fib with RVR.  Heart rate currently controlled this morning. Patient is medically stable for discharge to skilled nursing facility as soon as possible.   Assessment & Plan:   Principal Problem:   Closed displaced fracture of left femoral neck (HCC) Active Problems:   Atrial fibrillation with RVR (HCC)   Advanced dementia (HCC)   Goals of care, counseling/discussion   Palliative care by specialist   Left femoral neck fracture: Status post fall.  Ambulatory with wheelchair at baseline.    Underwent left hip hemiarthroplasty by orthopedics on 10/05/2018.  Continue pain management.   PT/OT done and recommended SNF.    A. fib with RVR: She was initially started on  IV cardizem.  Heart rate in the range of 70-80 this morning.  Continue to monitor on telemetry.She was not on any rate control medications at home.  Not on anticoagulation.  Started on oral Cardizem.  Anemia/thrombocytopenia: Stable  Left humerus neck  fracture: Orthopedics following.  No plan for intervention  Dementia with psychosis: Confused on baseline.  Continue supportive care.  Continue current medicines  History of congestive heart failure: Currently looks compensated.   Echocardiogram canceled by cardiology.  History of seizures: Not on antiepileptic  agents currently  Hypothyroidism: Continue Synthyroid  Hypertension: Currently blood stable.  Continue current meds  Debility/deconditioning/dementia/multiple comorbidities: Patient is DNR.  palliaitve care consultation done and recommended to follow up as an outpatient.         DVT prophylaxis: Lovenox Code Status: DNR Family Communication: Talked to daughter on 10/05/18 Disposition Plan: Back to skilled nursing facility as soon as possible  Consultants: Orthopedics, cardiology  Procedures: None  Antimicrobials:  Anti-infectives (From admission, onward)   Start     Dose/Rate Route Frequency Ordered Stop   10/06/18 0100  ceFAZolin (ANCEF) IVPB 2g/100 mL premix     2 g 200 mL/hr over 30 Minutes Intravenous Every 8 hours 10/05/18 2025 10/06/18 1033   10/05/18 1725  vancomycin (VANCOCIN) powder  Status:  Discontinued       As needed 10/05/18 1725 10/05/18 1809   10/05/18 1545  ceFAZolin (ANCEF) IVPB 2g/100 mL premix  Status:  Discontinued     2 g 200 mL/hr over 30 Minutes Intravenous On call to O.R. 10/04/18 1409 10/04/18 1824      Subjective:  Patient seen and examined the bedside this morning.  Hemodynamically stable.  Comfortable.  Eating her breakfast with assistance.  Heart rate fluctuating between 90-110.  Objective: Vitals:   10/09/18 1925 10/10/18 0515 10/10/18 0715 10/10/18 0812  BP: (!) 118/95 (!) 143/71  118/85  Pulse: (!) 113 (!) 43  (!) 110  Resp: (!) 21 (!) 26  19  Temp: 98.1 F (36.7 C) 98.4 F (36.9 C)  98.7 F (37.1  C)  TempSrc: Axillary Axillary  Oral  SpO2: 92% 93%    Weight:   46.7 kg   Height:        Intake/Output Summary (Last 24 hours) at 10/10/2018 1036 Last data filed at 10/09/2018 1950 Gross per 24 hour  Intake 120 ml  Output 650 ml  Net -530 ml   Filed Weights   10/08/18 0541 10/09/18 0646 10/10/18 0715  Weight: 46 kg 46.5 kg 46.7 kg     Examination:  General exam: Elderly demented, debilitated female  HEENT: Ear/Nose normal on gross exam Respiratory system: Bilateral equal air entry, normal vesicular breath sounds, no wheezes or crackles  Cardiovascular system: A. fib , no JVD, murmurs, rubs, gallops or clicks. No pedal edema. Gastrointestinal system: Abdomen is nondistended, soft and nontender. No organomegaly or masses felt. Normal bowel sounds heard. Central nervous system: Sleepy.  Confused  Extremities: No edema, no clubbing ,no cyanosis, distal peripheral pulses palpable. Clean surgical wound on the left hip Skin: No rashes, lesions or ulcers,no icterus ,no pallor   Data Reviewed: I have personally reviewed following labs and imaging studies  CBC: Recent Labs  Lab 10/03/18 1758  10/05/18 2057 10/06/18 0412 10/07/18 0347 10/08/18 0339 10/09/18 0613  WBC 8.4   < > 9.7 9.9 8.7 6.2 7.8  NEUTROABS 4.9  --   --   --   --   --  5.7  HGB 15.6*   < > 15.2* 15.3* 13.0 10.9* 13.2  HCT 48.0*   < > 45.0 46.1* 39.2 32.8* 39.0  MCV 99.2   < > 98.7 99.1 100.3* 100.3* 98.7  PLT 160   < > 126* 121* 105* 96* 135*   < > = values in this interval not displayed.   Basic Metabolic Panel: Recent Labs  Lab 10/03/18 1758 10/05/18 0513 10/05/18 2057 10/06/18 0412 10/07/18 0347  NA 142 140  --  140 142  K 4.3 4.0  --  3.8 3.9  CL 108 108  --  106 112*  CO2 21* 22  --  24 24  GLUCOSE 124* 151*  --  134* 103*  BUN 30* 20  --  15 12  CREATININE 0.81 0.69 0.87 0.66 0.70  CALCIUM 9.6 9.4  --  8.8* 8.4*   GFR: Estimated Creatinine Clearance: 31.7 mL/min (by C-G formula based on SCr of 0.7 mg/dL). Liver Function Tests: No results for input(s): AST, ALT, ALKPHOS, BILITOT, PROT, ALBUMIN in the last 168 hours. No results for input(s): LIPASE, AMYLASE in the last 168 hours. No results for input(s): AMMONIA in the last 168 hours. Coagulation Profile: No results for input(s): INR, PROTIME in the last 168 hours. Cardiac  Enzymes: No results for input(s): CKTOTAL, CKMB, CKMBINDEX, TROPONINI in the last 168 hours. BNP (last 3 results) No results for input(s): PROBNP in the last 8760 hours. HbA1C: No results for input(s): HGBA1C in the last 72 hours. CBG: No results for input(s): GLUCAP in the last 168 hours. Lipid Profile: No results for input(s): CHOL, HDL, LDLCALC, TRIG, CHOLHDL, LDLDIRECT in the last 72 hours. Thyroid Function Tests: No results for input(s): TSH, T4TOTAL, FREET4, T3FREE, THYROIDAB in the last 72 hours. Anemia Panel: No results for input(s): VITAMINB12, FOLATE, FERRITIN, TIBC, IRON, RETICCTPCT in the last 72 hours. Sepsis Labs: No results for input(s): PROCALCITON, LATICACIDVEN in the last 168 hours.  Recent Results (from the past 240 hour(s))  SARS Coronavirus 2 [facility] order, Performed in [facility] hospital lab) Nasopharyngeal Nasopharyngeal Swab  Status: None   Collection Time: 10/03/18  8:10 PM   Specimen: Nasopharyngeal Swab  Result Value Ref Range Status   SARS Coronavirus 2 NEGATIVE NEGATIVE Final    Comment: (NOTE) If result is NEGATIVE SARS-CoV-2 target nucleic acids are NOT DETECTED. The SARS-CoV-2 RNA is generally detectable in upper and lower  respiratory specimens during the acute phase of infection. The lowest  concentration of SARS-CoV-2 viral copies this assay can detect is 250  copies / mL. A negative result does not preclude SARS-CoV-2 infection  and should not be used as the sole basis for treatment or other  patient management decisions.  A negative result may occur with  improper specimen collection / handling, submission of specimen other  than nasopharyngeal swab, presence of viral mutation(s) within the  areas targeted by this assay, and inadequate number of viral copies  (<250 copies / mL). A negative result must be combined with clinical  observations, patient history, and epidemiological information. If result is POSITIVE SARS-CoV-2 target  nucleic acids are DETECTED. The SARS-CoV-2 RNA is generally detectable in upper and lower  respiratory specimens dur ing the acute phase of infection.  Positive  results are indicative of active infection with SARS-CoV-2.  Clinical  correlation with patient history and other diagnostic information is  necessary to determine patient infection status.  Positive results do  not rule out bacterial infection or co-infection with other viruses. If result is PRESUMPTIVE POSTIVE SARS-CoV-2 nucleic acids MAY BE PRESENT.   A presumptive positive result was obtained on the submitted specimen  and confirmed on repeat testing.  While 2019 novel coronavirus  (SARS-CoV-2) nucleic acids may be present in the submitted sample  additional confirmatory testing may be necessary for epidemiological  and / or clinical management purposes  to differentiate between  SARS-CoV-2 and other Sarbecovirus currently known to infect humans.  If clinically indicated additional testing with an alternate test  methodology [phone]) is advised. The SARS-CoV-2 RNA is generally  detectable in upper and lower respiratory sp ecimens during the acute  phase of infection. The expected result is Negative. Fact Sheet for Patients:  StrictlyIdeas.no Fact Sheet for Healthcare Providers: BankingDealers.co.za This test is not yet approved or cleared by the [place] FDA and has been authorized for detection and/or diagnosis of SARS-CoV-2 by FDA under an Emergency Use Authorization (EUA).  This EUA will remain in effect (meaning this test can be used) for the duration of the COVID-19 declaration under Section 564(b)(1) of the Act, 21 U.S.C. section 360bbb-3(b)(1), unless the authorization is terminated or revoked sooner. Performed at [facility], [address], [place], [place] [address]   Surgical pcr screen     Status: None   Collection Time: 10/04/18  3:32 PM   Specimen:  Nasal Mucosa; Nasal Swab  Result Value Ref Range Status   MRSA, PCR NEGATIVE NEGATIVE Final   Staphylococcus aureus NEGATIVE NEGATIVE Final    Comment: (NOTE) The Xpert SA Assay (FDA approved for NASAL specimens in patients 11 years of age and older), is one component of a comprehensive surveillance program. It is not intended to diagnose infection nor to guide or monitor treatment. Performed at [facility], [place] [address] [place], [place] [address]          Radiology Studies: No results found.      Scheduled Meds: . ALPRAZolam  0.25 mg Oral QHS  . busPIRone  5 mg Oral BID  . diltiazem  90 mg Oral Q12H  .  docusate  100 mg Oral BID  . enoxaparin (LOVENOX) injection  30 mg Subcutaneous Q24H  . levothyroxine  50 mcg Oral QAC breakfast  . mouth rinse  15 mL Mouth Rinse BID  . QUEtiapine  12.5 mg Oral QHS  . QUEtiapine  25 mg Oral q morning - 10a  . tranexamic acid (CYKLOKAPRON) topical -INTRAOP  2,000 mg Topical Once   Continuous Infusions: . lactated ringers 10 mL/hr at 10/05/18 0300  . methocarbamol (ROBAXIN) IV       LOS: 7 days    Time spent: 35 mins.More than 50% of that time was spent in counseling and/or coordination of care.      Shelly Coss, MD [facility] Pager [phone]  If 7PM-7AM, please contact night-coverage [email or web address] Password [facility] 10/10/2018, 10:36 AM

## 2018-10-10 NOTE — Progress Notes (Signed)
Location:   [facility] [place] Room Number: 154 D Place of Service:  SNF (31)   CODE STATUS: DNR  Allergies  Allergen Reactions  . Levofloxacin     unknown    Chief Complaint  Patient presents with  . Hospitalization Follow-up    Hospital Follow up    HPI:  She is a 85 year old long term resident of this facility who has been hospitalized from 10-03-18 through 10-10-18. She had a fall and suffered a left hip and left humeral fractures. She has had a left hip hemi arthroplasty on 10-05-18. There are no reports of uncontrolled pain; no anxiety or agitation. She will continue to monitor her chronic illnesses including: afib; anxiety psychosis.   Past Medical History:  Diagnosis Date  . Alcohol abuse 02/07/2018  . Atrial fibrillation ([place])   . CHF (congestive heart failure) ([place]) 02/07/2018  . Dementia ([place]) 02/07/2018  . GERD (gastroesophageal reflux disease)   . High cholesterol   . Hypertension   . Lung cancer ([place])   . Psychosis ([place]) 04/08/2018  . Seizures ([place]) 02/07/2018  . Thyroid disease   . TIA (transient ischemic attack)     Past Surgical History:  Procedure Laterality Date  . LOBECTOMY    . REPLACEMENT TOTAL KNEE Bilateral   . TOTAL HIP ARTHROPLASTY Left 10/05/2018   Procedure: HEMI HIP ARTHROPLASTY ANTERIOR APPROACH;  Surgeon: Leandrew Koyanagi, MD;  Location: [facility];  Service: Orthopedics;  Laterality: Left;    Social History   Socioeconomic History  . Marital status: Widowed    Spouse name: Not on file  . Number of children: Not on file  . Years of education: Not on file  . Highest education level: Not on file  Occupational History  . Not on file  Social Needs  . Financial resource strain: Not on file  . Food insecurity    Worry: Not on file    Inability: Not on file  . Transportation needs    Medical: Not on file    Non-medical: Not on file  Tobacco Use  . Smoking status: Never Smoker  . Smokeless tobacco: Never Used   Substance and Sexual Activity  . Alcohol use: Not Currently  . Drug use: No  . Sexual activity: Not Currently  Lifestyle  . Physical activity    Days per week: Not on file    Minutes per session: Not on file  . Stress: Not on file  Relationships  . Social Herbalist on phone: Not on file    Gets together: Not on file    Attends religious service: Not on file    Active member of club or organization: Not on file    Attends meetings of clubs or organizations: Not on file    Relationship status: Not on file  . Intimate partner violence    Fear of current or ex partner: Not on file    Emotionally abused: Not on file    Physically abused: Not on file    Forced sexual activity: Not on file  Other Topics Concern  . Not on file  Social History Narrative  . Not on file   Family History  Problem Relation Age of Onset  . Heart attack Father        36s      VITAL SIGNS BP 118/85   Pulse (!) 110   Temp 98.7 F (37.1 C)   Resp 19   Ht  5\' 2"  (1.575 m)   Wt 102 lb 15 oz (46.7 kg)   SpO2 93%   BMI 18.83 kg/m   Outpatient Encounter Medications as of 10/10/2018  Medication Sig  . ALPRAZolam (XANAX) 0.25 MG tablet Take 1 tablet (0.25 mg total) by mouth at bedtime.  . busPIRone (BUSPAR) 5 MG tablet Take 5 mg by mouth 2 (two) times daily.  [place] Kitchen diltiazem (TIAZAC) 240 MG 24 hr capsule Take 240 mg by mouth daily.  Mar[place] Kitchen enoxaparin (LOVENOX) 40 MG/0.4ML injection Inject 0.4 mLs (40 mg total) into the skin daily.  Mar[place] Kitchen HYDROcodone-acetaminophen (NORCO/VICODIN) 5-325 MG tablet Take 1 tablet by mouth every 8 (eight) hours as needed for moderate pain.  Mar[place] Kitchen levothyroxine (SYNTHROID, LEVOTHROID) 50 MCG tablet Take 50 mcg by mouth daily before breakfast.  . NON FORMULARY Diet Type:  Finely chopped, NAS  . polyethylene glycol (MIRALAX / GLYCOLAX) 17 g packet Take 17 g by mouth daily as needed for mild constipation.  . QUEtiapine (SEROQUEL) 25 MG tablet Take 25 mg by mouth every morning. At 9:00  am  . QUEtiapine Fumarate (SEROQUEL PO) Take 12.5 mg by mouth at bedtime.     SIGNIFICANT DIAGNOSTIC EXAMS  TODAY:   10-03-18: left hip and pelvis x-ray: Mildly comminuted, foreshortened and slightly valgus angulated transcervical left femoral neck fracture. Remote appearing posttraumatic deformity of the left inferior and superior pubic ramus  10-03-18: chest x-ray:  1. No active disease.  Likely chronic coarse interstitial changes. 2. Question projectional irregularity versus fracture of the left humerus. Correlate with point tenderness and consider dedicated shoulder radiographs if there is clinical concern  10-03-18: left humerus x-ray: Age indeterminate fracture of the left humeral neck. Clinical correlation is recommended.  10-05-18: pelvic x-ray:  1. Interval left hip replacement with expected surgical change 2. Chronic deformities of the left superior and inferior pubic rami   LABS REVIEWED PREVIOUS;   03-17-18: wbc 5.7; hgb 12.4; hct 38.8; mcv 108.1; plt 224; glucose 85; bun 13; creat 0.91; k+ 3.3; na++ 138; ca 8.7 protein 5.7 albumin 3.1 hgb 4.8 03-20-18: glucose 74; bun 14; creat 0.81; k+ 3.5; na++ 137; ca 9.2 04-19-28: tsh 93.852 05-18-18: tsh 31.786 06-29-18: vit B 12: 308; tsh 14.789 07-11-18: wbc 4.7; hgb 12.9; hct 39.1; mcv 104.5; plt 146 ; glucose 89; bun 24; creat 0.74; k+ 3.9; na++ 141; ca 9.0 liver normal albumin 3.2 tsh 13.945 free T4: 0.78 08-01-18: tsh 6.297 free T3; 2.2 free T4: 1.13  TODAY:   10-03-18: wbc 8.4; hgb 15.6; hct 48.0; mcv 99.2 plt 160; glucose 124; bun 30; creat 0.81; k+ 4.3; na++ 142; ca 9.6 10-06-18: wbc 9.9; hgb 15.3; hct 46.1; mcv 99.1; plt 121; glucose 134; bun 15; creat 0.66; k+ 3.8; na++ 140; ca 8.8 10-09-18: wbc 7.8; hgb 13.2; hct 39.0; mcv 98.7 plt 135   Review of Systems  Unable to perform ROS: Dementia (unable to participate )    Physical Exam Constitutional:      General: She is not in acute distress.    Appearance: She is  underweight. She is not diaphoretic.  Neck:     Musculoskeletal: Neck supple.     Thyroid: No thyromegaly.  Cardiovascular:     Rate and Rhythm: Normal rate and regular rhythm.     Pulses: Normal pulses.     Heart sounds: Normal heart sounds.     Comments: History of heart stent Pulmonary:     Effort: Pulmonary effort is normal. No respiratory distress.  Breath sounds: Normal breath sounds.  Abdominal:     General: Bowel sounds are normal. There is no distension.     Palpations: Abdomen is soft.     Tenderness: There is no abdominal tenderness.  Genitourinary:    Comments: foley Musculoskeletal:     Comments: Is able to move all extremities  Is status post left hip hemi arthroplasty 10-05-18 Is status post left humeral fracture History of bilateral knee replacements   Lymphadenopathy:     Cervical: No cervical adenopathy.  Skin:    General: Skin is warm and dry.  Neurological:     Mental Status: She is alert. Mental status is at baseline.  Psychiatric:        Mood and Affect: Mood normal.      ASSESSMENT/ PLAN:  TODAY;   1. Closed displaced fracture of left humeral neck: is stable will continue therapy as directed and will follow up with orthopedics; will continue vicodin 5.325 mg every 8 hours as needed through 10-17-18. Will continue lovenox 40 mg daily for total of 14 days therapy  2. Unspecified chronic CHF (congestive heart failure) is compentated will continue to monitor   3. Atrial fibrillation with RVR: heart rate is stable will continue tiazac 24 hour cap daily for rate control  4. Acquired hypothyroidism: is stable. tsh 6.297 (free t3: 2.2 free t4: 1.13) will continue synthroid 50 mcg daily and will monitor   5. Advanced dementia: is without significant change in status: weight is stable at 97 pounds (previous 98) pounds; will continue to monitor her status.   6. Seizure: no reports of seizure activity will monitor   7. 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 Chronic anxiety: is without change will continue xanax 0.25 mg nightly and buspar 5 mg twice daily   9. Closed nondisplaced fracture of surgical neck of left humerus; unspecified morphology sequela: is without change in status; is moving her left arm. Will continue to monitor her status.     MD is aware of resident's narcotic use and is in agreement with current plan of care. We will attempt to wean resident as apropriate   Ok Edwards NP [facility]  Contact [phone] Monday through Friday 8am- 5pm  After hours call [phone]

## 2018-10-11 ENCOUNTER — Non-Acute Institutional Stay (SKILLED_NURSING_FACILITY): Payer: Medicare HMO | Admitting: Internal Medicine

## 2018-10-11 ENCOUNTER — Encounter: Payer: Self-pay | Admitting: Internal Medicine

## 2018-10-11 DIAGNOSIS — S72002A Fracture of unspecified part of neck of left femur, initial encounter for closed fracture: Secondary | ICD-10-CM

## 2018-10-11 DIAGNOSIS — F419 Anxiety disorder, unspecified: Secondary | ICD-10-CM

## 2018-10-11 DIAGNOSIS — S42215S Unspecified nondisplaced fracture of surgical neck of left humerus, sequela: Secondary | ICD-10-CM | POA: Diagnosis not present

## 2018-10-11 DIAGNOSIS — F03C0 Unspecified dementia, severe, without behavioral disturbance, psychotic disturbance, mood disturbance, and anxiety: Secondary | ICD-10-CM

## 2018-10-11 DIAGNOSIS — E039 Hypothyroidism, unspecified: Secondary | ICD-10-CM

## 2018-10-11 DIAGNOSIS — I4891 Unspecified atrial fibrillation: Secondary | ICD-10-CM

## 2018-10-11 DIAGNOSIS — F0391 Unspecified dementia with behavioral disturbance: Secondary | ICD-10-CM

## 2018-10-11 DIAGNOSIS — F03918 Unspecified dementia, unspecified severity, with other behavioral disturbance: Secondary | ICD-10-CM

## 2018-10-11 DIAGNOSIS — F0390 Unspecified dementia without behavioral disturbance: Secondary | ICD-10-CM

## 2018-10-11 DIAGNOSIS — R569 Unspecified convulsions: Secondary | ICD-10-CM

## 2018-10-11 NOTE — Progress Notes (Signed)
: Provider:  Noah Delaine. Sheppard Coil MD Location:  [place] Room Number: 154/D Place of Service:  SNF [phone])  PCP: Hennie Duos, MD Patient Care Team: Hennie Duos, MD as PCP - General (Internal Medicine) Nyoka Cowden Phylis Bougie, NP as Nurse Practitioner ([place]) Center, [place] ([place])  Extended Emergency Contact Information Primary Emergency Contact: KELLY,WENDY Address: [address], [place] [address] [place] [employer] Phone: [phone] Relation: Daughter Secondary Emergency Contact: Quentin Ore Address: [address] [place] [address] [place] [employer] Phone: [phone] Relation: Daughter     Allergies: Levofloxacin  Chief Complaint  Patient presents with  . Readmit To SNF    Readmission Visit    HPI: Patient is 85 y.o. female dementia, psychosis, atrial fib, congestive heart failure, was brought to the ED at any pain hospital from [facility] after a fall.  Patient was confused and unable to answer any questions.  She was found to be in A. fib with RVR on presentation.  Imaging done on presentation showed closed displaced fracture of the left femoral neck.  And a nondisplaced fracture of the surgical cervical neck of left humerus.  Patient was admitted to [facility] and then to [facility] from 8/11-18.  She underwent a left hemiarthroplasty by Ortho on 8/13.  Lovenox for DVT.  For patient's atrial fib with RVR she was started on IV Cardizem and then started on oral Cardizem shoulder fracture will be treated with a sling.  All other problems being stable patient is admitted to skilled nursing facility for OT/PT.  At skilled nursing facility patient will be followed for anxiety treated with BuSpar and Xanax, hypothyroidism treated with replacement and psychosis treated with Seroquel.  Past Medical History:  Diagnosis Date  .  Alcohol abuse 02/07/2018  . Atrial fibrillation ([place])   . CHF (congestive heart failure) ([place]) 02/07/2018  . Dementia ([place]) 02/07/2018  . GERD (gastroesophageal reflux disease)   . High cholesterol   . Hypertension   . Lung cancer ([place])   . Psychosis ([place]) 04/08/2018  . Seizures ([place]) 02/07/2018  . Thyroid disease   . TIA (transient ischemic attack)     Past Surgical History:  Procedure Laterality Date  . LOBECTOMY    . REPLACEMENT TOTAL KNEE Bilateral   . TOTAL HIP ARTHROPLASTY Left 10/05/2018   Procedure: HEMI HIP ARTHROPLASTY ANTERIOR APPROACH;  Surgeon: Leandrew Koyanagi, MD;  Location: [facility];  Service: Orthopedics;  Laterality: Left;    Outpatient Encounter Medications as of 10/11/2018  Medication Sig  . ALPRAZolam (XANAX) 0.25 MG tablet Take 1 tablet (0.25 mg total) by mouth at bedtime.  . busPIRone (BUSPAR) 5 MG tablet Take 5 mg by mouth 2 (two) times daily.  Marland Kitchen diltiazem (TIAZAC) 240 MG 24 hr capsule Take 240 mg by mouth daily.  Marland Kitchen enoxaparin (LOVENOX) 40 MG/0.4ML injection Inject 0.4 mLs (40 mg total) into the skin daily.  Marland Kitchen HYDROcodone-acetaminophen (NORCO/VICODIN) 5-325 MG tablet Take 1 tablet by mouth every 8 (eight) hours as needed for moderate pain.  Marland Kitchen levothyroxine (SYNTHROID, LEVOTHROID) 50 MCG tablet Take 50 mcg by mouth daily before breakfast.  . NON FORMULARY Diet Type:  Finely chopped, NAS  . polyethylene glycol (MIRALAX / GLYCOLAX) 17 g packet Take 17 g  by mouth daily as needed for mild constipation.  . QUEtiapine (SEROQUEL) 25 MG tablet Take 25 mg by mouth every morning. At 9:00 am  . QUEtiapine Fumarate (SEROQUEL PO) Take 12.5 mg by mouth at bedtime.   No facility-administered encounter medications on file as of 10/11/2018.      No orders of the defined types were placed in this encounter.   Immunization History  Administered Date(s) Administered  . Pneumococcal Polysaccharide-23 08/10/2018    Social History   Tobacco Use  . Smoking status: Never Smoker   . Smokeless tobacco: Never Used  Substance Use Topics  . Alcohol use: Not Currently    Family history is   Family History  Problem Relation Age of Onset  . Heart attack Father        84s      Review of Systems  DATA OBTAINED: from nurse GENERAL:  no fevers, fatigue, appetite changes SKIN: No itching, or rash EYES: No eye pain, redness, discharge EARS: No earache, tinnitus, change in hearing NOSE: No congestion, drainage or bleeding  MOUTH/THROAT: No mouth or tooth pain, No sore throat RESPIRATORY: No cough, wheezing, SOB CARDIAC: No chest pain, palpitations, lower extremity edema  GI: No abdominal pain, No N/V/D or constipation, No heartburn or reflux  GU: No dysuria, frequency or urgency, or incontinence  MUSCULOSKELETAL: No unrelieved bone/joint pain NEUROLOGIC: No headache, dizziness or focal weakness PSYCHIATRIC: No c/o anxiety or sadness   Vitals:   10/11/18 1435  BP: (!) 87/62  Pulse: 62  Resp: 13  Temp: 97.8 F (36.6 C)  SpO2: 96%    SpO2 Readings from Last 1 Encounters:  10/11/18 96%   Body mass index is 17.81 kg/m.     Physical Exam  GENERAL APPEARANCE: Alert, conversant,  No acute distress.  SKIN: No heat or excessive swelling to left lower extremity HEAD: Normocephalic, atraumatic  EYES: Conjunctiva/lids clear. Pupils round, reactive. EOMs intact.  EARS: External exam WNL, canals clear. Hearing grossly normal.  NOSE: No deformity or discharge.  MOUTH/THROAT: Lips w/o lesions  RESPIRATORY: Breathing is even, unlabored. Lung sounds are clear   CARDIOVASCULAR: Heart RRR no murmurs, rubs or gallops. No peripheral edema.   GASTROINTESTINAL: Abdomen is soft, non-tender, not distended w/ normal bowel sounds. GENITOURINARY: Bladder non tender, not distended  MUSCULOSKELETAL: Sling left arm NEUROLOGIC:  Cranial nerves 2-12 grossly intact. Moves all extremities  PSYCHIATRIC: Dementia, no behavioral issues  Patient Active Problem List   Diagnosis  Date Noted  . Goals of care, counseling/discussion   . Palliative care by specialist   . Closed displaced fracture of left femoral neck ([place]) 10/03/2018  . Left ankle sprain 06/02/2018  . Chronic anxiety 05/11/2018  . Acquired hypothyroidism 04/25/2018  . Psychosis in elderly with behavioral disturbance ([place]) 04/08/2018  . Atrial fibrillation with RVR ([place]) 02/07/2018  . Advanced dementia ([place]) 02/07/2018  . Anxiety 02/07/2018  . Seizures ([place]) 02/07/2018  . Alcohol abuse 02/07/2018  . CHF (congestive heart failure) ([place]) 02/07/2018      Labs reviewed: Basic Metabolic Panel:    Component Value Date/Time   NA 142 10/07/2018 0347   K 3.9 10/07/2018 0347   CL 112 (H) 10/07/2018 0347   CO2 24 10/07/2018 0347   GLUCOSE 103 (H) 10/07/2018 0347   BUN 12 10/07/2018 0347   CREATININE 0.70 10/07/2018 0347   CALCIUM 8.4 (L) 10/07/2018 0347   PROT 5.6 (L) 07/11/2018 0600   ALBUMIN 3.2 (L) 07/11/2018 0600   AST 18 07/11/2018 0600  ALT 13 07/11/2018 0600   ALKPHOS 90 07/11/2018 0600   BILITOT 0.7 07/11/2018 0600   GFRNONAA >60 10/07/2018 0347   GFRAA >60 10/07/2018 0347    Recent Labs    10/05/18 0513 10/05/18 2057 10/06/18 0412 10/07/18 0347  NA 140  --  140 142  K 4.0  --  3.8 3.9  CL 108  --  106 112*  CO2 22  --  24 24  GLUCOSE 151*  --  134* 103*  BUN 20  --  15 12  CREATININE 0.69 0.87 0.66 0.70  CALCIUM 9.4  --  8.8* 8.4*   Liver Function Tests: Recent Labs    03/17/18 0759 07/11/18 0600  AST 20 18  ALT 14 13  ALKPHOS 81 90  BILITOT 0.9 0.7  PROT 5.7* 5.6*  ALBUMIN 3.1* 3.2*   No results for input(s): LIPASE, AMYLASE in the last 8760 hours. No results for input(s): AMMONIA in the last 8760 hours. CBC: Recent Labs    03/17/18 0759  10/03/18 1758  10/07/18 0347 10/08/18 0339 10/09/18 0613  WBC 5.7   < > 8.4   < > 8.7 6.2 7.8  NEUTROABS 3.8  --  4.9  --   --   --  5.7  HGB 12.4   < > 15.6*   < > 13.0 10.9* 13.2  HCT 38.8   < > 48.0*   < > 39.2  32.8* 39.0  MCV 108.1*   < > 99.2   < > 100.3* 100.3* 98.7  PLT 224   < > 160   < > 105* 96* 135*   < > = values in this interval not displayed.   Lipid No results for input(s): CHOL, HDL, LDLCALC, TRIG in the last 8760 hours.  Cardiac Enzymes: No results for input(s): CKTOTAL, CKMB, CKMBINDEX, TROPONINI in the last 8760 hours. BNP: No results for input(s): BNP in the last 8760 hours. No results found for: [facility] Lab Results  Component Value Date   HGBA1C 4.8 03/17/2018   Lab Results  Component Value Date   TSH 6.297 (H) 08/01/2018   Lab Results  Component Value Date   VITAMINB12 308 06/29/2018   No results found for: FOLATE No results found for: IRON, TIBC, FERRITIN  Imaging and Procedures obtained prior to SNF admission: No results found.   Not all labs, radiology exams or other studies done during hospitalization come through on my EPIC note; however they are reviewed by me.    Assessment and Plan  Left femoral neck fracture displaced- status post hemiarthroplasty on 8/13; DVT prophylaxis with Lovenox for 2 weeks SNF- admitted for OT/PT; continue Lovenox 40 mg daily for 14 days from surgery and Vicodin 5/325 every 8 hours as needed for pain for 1 week and then re-eval  Left humeral neck fracture- treated with sling SNF- continue left arm sling; admitted for OT  Atrial fibrillation with RVR- treated with IV Cardizem and then transition to p.o. Cardizem SNF- continue diltiazem 240 mg daily for rate; after prophylaxis for surgery no anticoagulation secondary to fall risk  Seizures SNF- patient is on no medication will monitor  Anxiety SNF- continue BuSpar 5 mg twice daily and Xanax 0.25 mg nightly  Hypothyroidism SNF- TSH 6.297, free T4 1.13; continue Synthroid 50 mg daily  Psychoses SNF- appears reasonably controlled; continue Seroquel 25 mg every morning and 12.5 mg nightly    Time spent greater than 35 minutes;> 50% of time with patient was spent  reviewing  records, labs, tests and studies, counseling and developing plan of care  Hennie Duos, MD

## 2018-10-14 ENCOUNTER — Encounter: Payer: Self-pay | Admitting: Internal Medicine

## 2018-10-14 DIAGNOSIS — S42302A Unspecified fracture of shaft of humerus, left arm, initial encounter for closed fracture: Secondary | ICD-10-CM

## 2018-10-14 HISTORY — DX: Unspecified fracture of shaft of humerus, left arm, initial encounter for closed fracture: S42.302A

## 2018-10-16 ENCOUNTER — Other Ambulatory Visit (HOSPITAL_COMMUNITY)
Admission: RE | Admit: 2018-10-16 | Discharge: 2018-10-16 | Disposition: A | Discharge status: Home / Self Care | Payer: Medicare HMO | Source: Skilled Nursing Facility | Attending: Internal Medicine | Admitting: Internal Medicine

## 2018-10-16 DIAGNOSIS — Z20828 Contact with and (suspected) exposure to other viral communicable diseases: Secondary | ICD-10-CM | POA: Insufficient documentation

## 2018-10-17 ENCOUNTER — Encounter: Payer: Self-pay | Admitting: Adult Health

## 2018-10-17 ENCOUNTER — Non-Acute Institutional Stay (SKILLED_NURSING_FACILITY): Payer: Medicare HMO | Admitting: Adult Health

## 2018-10-17 DIAGNOSIS — S72002A Fracture of unspecified part of neck of left femur, initial encounter for closed fracture: Secondary | ICD-10-CM | POA: Diagnosis not present

## 2018-10-17 DIAGNOSIS — I4891 Unspecified atrial fibrillation: Secondary | ICD-10-CM

## 2018-10-17 DIAGNOSIS — S42215S Unspecified nondisplaced fracture of surgical neck of left humerus, sequela: Secondary | ICD-10-CM | POA: Diagnosis not present

## 2018-10-17 LAB — SARS CORONAVIRUS 2 (TAT 6-24 HRS): SARS Coronavirus 2: NEGATIVE

## 2018-10-17 NOTE — Progress Notes (Signed)
Location:    [place] Room Number: 154/D Place of Service:  SNF (31)   CODE STATUS: DNR  Allergies  Allergen Reactions  . Levofloxacin     unknown    Chief Complaint  Patient presents with  . Medical Management of Chronic Issues         Atrial fibrillation with RVR: Closed nondisplaced fracture of surgical neck of left humerus; unspecified morphology;   Closed displaced fracture of left femoral neck:  Weekly follow up for the first 30 days post hospitalization     HPI:  She is 86 year old long term resident of this facility being seen for the management of her chronic illnesses: afib; humeral fracture; femoral fracture. There are no reports of uncontrolled pain; no changes in her appetite; no reports of agitation or anxiety.   Past Medical History:  Diagnosis Date  . Alcohol abuse 02/07/2018  . Atrial fibrillation ([place])   . CHF (congestive heart failure) ([place]) 02/07/2018  . Dementia ([place]) 02/07/2018  . GERD (gastroesophageal reflux disease)   . High cholesterol   . Hypertension   . Lung cancer ([place])   . Psychosis ([place]) 04/08/2018  . Seizures ([place]) 02/07/2018  . Thyroid disease   . TIA (transient ischemic attack)     Past Surgical History:  Procedure Laterality Date  . LOBECTOMY    . REPLACEMENT TOTAL KNEE Bilateral   . TOTAL HIP ARTHROPLASTY Left 10/05/2018   Procedure: HEMI HIP ARTHROPLASTY ANTERIOR APPROACH;  Surgeon: Leandrew Koyanagi, MD;  Location: [facility];  Service: Orthopedics;  Laterality: Left;    Social History   Socioeconomic History  . Marital status: Widowed    Spouse name: Not on file  . Number of children: Not on file  . Years of education: Not on file  . Highest education level: Not on file  Occupational History  . Not on file  Social Needs  . Financial resource strain: Not on file  . Food insecurity    Worry: Not on file    Inability: Not on file  . Transportation needs    Medical: Not on file    Non-medical: Not on  file  Tobacco Use  . Smoking status: Never Smoker  . Smokeless tobacco: Never Used  Substance and Sexual Activity  . Alcohol use: Not Currently  . Drug use: No  . Sexual activity: Not Currently  Lifestyle  . Physical activity    Days per week: Not on file    Minutes per session: Not on file  . Stress: Not on file  Relationships  . Social Herbalist on phone: Not on file    Gets together: Not on file    Attends religious service: Not on file    Active member of club or organization: Not on file    Attends meetings of clubs or organizations: Not on file    Relationship status: Not on file  . Intimate partner violence    Fear of current or ex partner: Not on file    Emotionally abused: Not on file    Physically abused: Not on file    Forced sexual activity: Not on file  Other Topics Concern  . Not on file  Social History Narrative  . Not on file   Family History  Problem Relation Age of Onset  . Heart attack Father        26s      VITAL SIGNS BP 91/68  Pulse 67   Temp 98.2 F (36.8 C) (Oral)   Resp 20   Ht 5\' 2"  (1.575 m)   Wt 98 lb 1.6 oz (44.5 kg)   BMI 17.94 kg/m   Outpatient Encounter Medications as of 10/17/2018  Medication Sig  . ALPRAZolam (XANAX) 0.25 MG tablet Take 1 tablet (0.25 mg total) by mouth at bedtime.  . busPIRone (BUSPAR) 5 MG tablet Take 5 mg by mouth 2 (two) times daily.  Marland Kitchen diltiazem (TIAZAC) 240 MG 24 hr capsule Take 240 mg by mouth daily.  Marland Kitchen enoxaparin (LOVENOX) 40 MG/0.4ML injection Inject 0.4 mLs (40 mg total) into the skin daily.  . [EXPIRED] HYDROcodone-acetaminophen (NORCO/VICODIN) 5-325 MG tablet Take 1 tablet by mouth every 8 (eight) hours as needed for moderate pain.  Marland Kitchen levothyroxine (SYNTHROID, LEVOTHROID) 50 MCG tablet Take 50 mcg by mouth daily before breakfast.  . NON FORMULARY Diet Type:  Finely chopped, NAS  . polyethylene glycol (MIRALAX / GLYCOLAX) 17 g packet Take 17 g by mouth daily as needed for mild  constipation.  . QUEtiapine (SEROQUEL) 25 MG tablet Take 25 mg by mouth every morning. At 9:00 am  . QUEtiapine Fumarate (SEROQUEL PO) Take 12.5 mg by mouth at bedtime.   No facility-administered encounter medications on file as of 10/17/2018.      SIGNIFICANT DIAGNOSTIC EXAMS  PREVIOUS    10-03-18: left hip and pelvis x-ray: Mildly comminuted, foreshortened and slightly valgus angulated transcervical left femoral neck fracture. Remote appearing posttraumatic deformity of the left inferior and superior pubic ramus  10-03-18: chest x-ray:  1. No active disease.  Likely chronic coarse interstitial changes. 2. Question projectional irregularity versus fracture of the left humerus. Correlate with point tenderness and consider dedicated shoulder radiographs if there is clinical concern  10-03-18: left humerus x-ray: Age indeterminate fracture of the left humeral neck. Clinical correlation is recommended.  10-05-18: pelvic x-ray:  1. Interval left hip replacement with expected surgical change 2. Chronic deformities of the left superior and inferior pubic rami  NO NEW EXAMS.    LABS REVIEWED PREVIOUS;   03-17-18: wbc 5.7; hgb 12.4; hct 38.8; mcv 108.1; plt 224; glucose 85; bun 13; creat 0.91; k+ 3.3; na++ 138; ca 8.7 protein 5.7 albumin 3.1 hgb 4.8 03-20-18: glucose 74; bun 14; creat 0.81; k+ 3.5; na++ 137; ca 9.2 04-19-28: tsh 93.852 05-18-18: tsh 31.786 06-29-18: vit B 12: 308; tsh 14.789 07-11-18: wbc 4.7; hgb 12.9; hct 39.1; mcv 104.5; plt 146 ; glucose 89; bun 24; creat 0.74; k+ 3.9; na++ 141; ca 9.0 liver normal albumin 3.2 tsh 13.945 free T4: 0.78 08-01-18: tsh 6.297 free T3; 2.2 free T4: 1.13 10-03-18: wbc 8.4; hgb 15.6; hct 48.0; mcv 99.2 plt 160; glucose 124; bun 30; creat 0.81; k+ 4.3; na++ 142; ca 9.6 10-06-18: wbc 9.9; hgb 15.3; hct 46.1; mcv 99.1; plt 121; glucose 134; bun 15; creat 0.66; k+ 3.8; na++ 140; ca 8.8 10-09-18: wbc 7.8; hgb 13.2; hct 39.0; mcv 98.7 plt 135  NO NEW LABS.     Review of Systems  Unable to perform ROS: Dementia (unable to participate )    Physical Exam Constitutional:      General: She is not in acute distress.    Appearance: She is underweight. She is not diaphoretic.  Neck:     Musculoskeletal: Neck supple.     Thyroid: No thyromegaly.  Cardiovascular:     Rate and Rhythm: Normal rate and regular rhythm.     Pulses: Normal pulses.  Heart sounds: Normal heart sounds.     Comments: History of heart stent Pulmonary:     Effort: Pulmonary effort is normal. No respiratory distress.     Breath sounds: Normal breath sounds.  Abdominal:     General: Bowel sounds are normal. There is no distension.     Palpations: Abdomen is soft.     Tenderness: There is no abdominal tenderness.  Genitourinary:    Comments: Foley  Musculoskeletal:     Right lower leg: No edema.     Left lower leg: No edema.     Comments: Is able to move all extremities  Is status post left hip hemi arthroplasty 10-05-18 Is status post left humeral fracture History of bilateral knee replacements    Lymphadenopathy:     Cervical: No cervical adenopathy.  Skin:    General: Skin is warm and dry.     Comments: Incision line without signs of infection present   Neurological:     Mental Status: She is alert. Mental status is at baseline.  Psychiatric:        Mood and Affect: Mood normal.       ASSESSMENT/ PLAN:  TODAY;   1. Atrial fibrillation with RVR: heart rate is stable will continue tiazac 250 mg daily for rate control will monitor   2. Closed nondisplaced fracture of surgical neck of left humerus; unspecified morphology; is stable will continue to monitor her status  3. Closed displaced fracture of left femoral neck: is stable will continue therapy as directed and will follow up with orthopedics; will continue vicodin 5.325 mg every 8 hours as needed through 10-17-18. Will continue lovenox 40 mg daily for 14 days   PREVIOUS   4. Acquired  hypothyroidism: is stable. tsh 6.297 (free t3: 2.2 free t4: 1.13) will continue synthroid 50 mcg daily and will monitor   5. Advanced dementia: is without significant change in status: weight is stable at 97 pounds (previous 98) pounds; will continue to monitor her status.   6. Seizure: no reports of seizure activity will monitor   7. Psychosis in the elderly with behavioral disturbance: is without change will continue seroquel 25 mg in the AM and 12.5 mg in the PM   8. Chronic anxiety: is without change will continue xanax 0.25 mg nightly and buspar 5 mg twice daily   9. Unspecified chronic CHF (congestive heart failure) is compentated will continue to monitor      MD is aware of resident's narcotic use and is in agreement with current plan of care. We will attempt to wean resident as apropriate   Ok Edwards NP [facility]  Contact [phone] Monday through Friday 8am- 5pm  After hours call [phone]

## 2018-10-19 ENCOUNTER — Inpatient Hospital Stay (INDEPENDENT_AMBULATORY_CARE_PROVIDER_SITE_OTHER): Payer: Medicare HMO

## 2018-10-19 ENCOUNTER — Ambulatory Visit (INDEPENDENT_AMBULATORY_CARE_PROVIDER_SITE_OTHER): Payer: Medicare HMO | Admitting: Physician Assistant

## 2018-10-19 ENCOUNTER — Encounter: Payer: Self-pay | Admitting: Orthopaedic Surgery

## 2018-10-19 ENCOUNTER — Telehealth: Payer: Self-pay | Admitting: Orthopaedic Surgery

## 2018-10-19 DIAGNOSIS — M25552 Pain in left hip: Secondary | ICD-10-CM | POA: Diagnosis not present

## 2018-10-19 NOTE — Telephone Encounter (Signed)
Patient daughter aware of what happened at visit

## 2018-10-19 NOTE — Progress Notes (Signed)
Post-Op Visit Note   Patient: Casey Wood           Date of Birth: [birth year]           MRN: [id number] Visit Date: 10/19/2018 PCP: Hennie Duos, MD   Assessment & Plan:  Chief Complaint:  Chief Complaint  Patient presents with  . Left Hip - Routine Post Op    Left hip hemi arthroplasty DOS 10/05/2018   Visit Diagnoses:  1. Pain in left hip     Plan: Patient is a pleasant 85 year old female who presents our clinic today 2 weeks status post left hip hemiarthroplasty from a femoral neck fracture, date of surgery 10/05/2018.  She has [facility] at [facility].  She is here with a nurse tech.  She is ambulating in a wheelchair.  She denies any significant pain.  She has been working with physical therapy.  Examination of her left hip reveals a well-healing surgical incision with nylon sutures in place.  She does have a moderate sized seroma.  No erythema no tenderness.  Calf soft and nontender.  She is neurovascular intact distally.  Today, the seroma was aspirated which yielded approximately 80 cc of bloody fluid.  X-rays demonstrate a well-seated prosthesis.  At this point, she will continue with physical therapy.  We will start her on Keflex 500 mg 4 times daily x2 weeks for the seroma.  She will follow-up with [place] in 2 weeks time for repeat evaluation of the seroma.  Call with concerns or questions.  Follow-Up Instructions: Return in about 2 weeks (around 11/02/2018).   Orders:  Orders Placed This Encounter  Procedures  . XR HIP UNILAT W OR W/O PELVIS 2-3 VIEWS LEFT   No orders of the defined types were placed in this encounter.   Imaging: Xr Hip Unilat W Or W/o Pelvis 2-3 Views Left  Result Date: 10/19/2018 X-rays demonstrate a well seated prosthesis without complication   PMFS History: Patient Active Problem List   Diagnosis Date Noted  . Closed left humeral fracture 10/14/2018  . Goals of care, counseling/discussion   . Palliative care by specialist   . Closed  displaced fracture of left femoral neck ([place]) 10/03/2018  . Left ankle sprain 06/02/2018  . Chronic anxiety 05/11/2018  . Acquired hypothyroidism 04/25/2018  . Psychosis in elderly with behavioral disturbance ([place]) 04/08/2018  . Atrial fibrillation with RVR ([place]) 02/07/2018  . Advanced dementia ([place]) 02/07/2018  . Seizures ([place]) 02/07/2018  . Alcohol abuse 02/07/2018  . CHF (congestive heart failure) ([place]) 02/07/2018   Past Medical History:  Diagnosis Date  . Alcohol abuse 02/07/2018  . Atrial fibrillation ([place])   . CHF (congestive heart failure) ([place]) 02/07/2018  . Dementia ([place]) 02/07/2018  . GERD (gastroesophageal reflux disease)   . High cholesterol   . Hypertension   . Lung cancer ([place])   . Psychosis ([place]) 04/08/2018  . Seizures ([place]) 02/07/2018  . Thyroid disease   . TIA (transient ischemic attack)     Family History  Problem Relation Age of Onset  . Heart attack Father        31s    Past Surgical History:  Procedure Laterality Date  . LOBECTOMY    . REPLACEMENT TOTAL KNEE Bilateral   . TOTAL HIP ARTHROPLASTY Left 10/05/2018   Procedure: HEMI HIP ARTHROPLASTY ANTERIOR APPROACH;  Surgeon: Leandrew Koyanagi, MD;  Location: [facility];  Service: Orthopedics;  Laterality: Left;   Social History   Occupational History  .  Not on file  Tobacco Use  . Smoking status: Never Smoker  . Smokeless tobacco: Never Used  Substance and Sexual Activity  . Alcohol use: Not Currently  . Drug use: No  . Sexual activity: Not Currently

## 2018-10-19 NOTE — Telephone Encounter (Signed)
Patient's daughter would like to speak with you in regards to the patient's appointment today.  CB#[phone].  Thank you.

## 2018-10-25 ENCOUNTER — Encounter: Payer: Self-pay | Admitting: Adult Health

## 2018-10-25 ENCOUNTER — Non-Acute Institutional Stay (SKILLED_NURSING_FACILITY): Payer: Medicare HMO | Admitting: Adult Health

## 2018-10-25 DIAGNOSIS — F0391 Unspecified dementia with behavioral disturbance: Secondary | ICD-10-CM | POA: Diagnosis not present

## 2018-10-25 DIAGNOSIS — I4891 Unspecified atrial fibrillation: Secondary | ICD-10-CM

## 2018-10-25 DIAGNOSIS — S72002A Fracture of unspecified part of neck of left femur, initial encounter for closed fracture: Secondary | ICD-10-CM

## 2018-10-25 DIAGNOSIS — F0390 Unspecified dementia without behavioral disturbance: Secondary | ICD-10-CM | POA: Diagnosis not present

## 2018-10-25 DIAGNOSIS — F03C0 Unspecified dementia, severe, without behavioral disturbance, psychotic disturbance, mood disturbance, and anxiety: Secondary | ICD-10-CM

## 2018-10-25 DIAGNOSIS — F03918 Unspecified dementia, unspecified severity, with other behavioral disturbance: Secondary | ICD-10-CM

## 2018-10-25 NOTE — Progress Notes (Signed)
Location:    [facility] Room Number: 140/W Place of Service:  SNF (31)   CODE STATUS: DNR  Allergies  Allergen Reactions  . Levofloxacin     unknown    Chief Complaint  Patient presents with  . Acute Visit    Care Plan Meeting    HPI:  We have come together for her care plan meeting. Unable to do BIMS 0/30 mood. She has had one fall without injury since her re-admission to this facility. She is ambulating without assistance. Her weight is presently at 98 pounds she has lost weight. This is an unfortunate but expected outcome at the late stages of dementia especially with a recent fracture of major bone. There are no reports of uncontrolled pain. Her appetite remains variable; no reports of agitation or insomnia.    Past Medical History:  Diagnosis Date  . Alcohol abuse 02/07/2018  . Atrial fibrillation ([place])   . CHF (congestive heart failure) ([place]) 02/07/2018  . Dementia ([place]) 02/07/2018  . GERD (gastroesophageal reflux disease)   . High cholesterol   . Hypertension   . Lung cancer ([place])   . Psychosis ([place]) 04/08/2018  . Seizures ([place]) 02/07/2018  . Thyroid disease   . TIA (transient ischemic attack)     Past Surgical History:  Procedure Laterality Date  . LOBECTOMY    . REPLACEMENT TOTAL KNEE Bilateral   . TOTAL HIP ARTHROPLASTY Left 10/05/2018   Procedure: HEMI HIP ARTHROPLASTY ANTERIOR APPROACH;  Surgeon: Leandrew Koyanagi, MD;  Location: [facility];  Service: Orthopedics;  Laterality: Left;    Social History   Socioeconomic History  . Marital status: Widowed    Spouse name: Not on file  . Number of children: Not on file  . Years of education: Not on file  . Highest education level: Not on file  Occupational History  . Not on file  Social Needs  . Financial resource strain: Not on file  . Food insecurity    Worry: Not on file    Inability: Not on file  . Transportation needs    Medical: Not on file    Non-medical: Not on file  Tobacco  Use  . Smoking status: Never Smoker  . Smokeless tobacco: Never Used  Substance and Sexual Activity  . Alcohol use: Not Currently  . Drug use: No  . Sexual activity: Not Currently  Lifestyle  . Physical activity    Days per week: Not on file    Minutes per session: Not on file  . Stress: Not on file  Relationships  . Social Herbalist on phone: Not on file    Gets together: Not on file    Attends religious service: Not on file    Active member of club or organization: Not on file    Attends meetings of clubs or organizations: Not on file    Relationship status: Not on file  . Intimate partner violence    Fear of current or ex partner: Not on file    Emotionally abused: Not on file    Physically abused: Not on file    Forced sexual activity: Not on file  Other Topics Concern  . Not on file  Social History Narrative  . Not on file   Family History  Problem Relation Age of Onset  . Heart attack Father        59s      VITAL SIGNS BP 128/62   Pulse  60   Temp (!) 97.1 F (36.2 C) (Oral)   Resp 20   Ht 5\' 2"  (1.575 m)   Wt 91 lb 6.4 oz (41.5 kg)   BMI 16.72 kg/m   Outpatient Encounter Medications as of 10/25/2018  Medication Sig  . ALPRAZolam (XANAX) 0.25 MG tablet Take 1 tablet (0.25 mg total) by mouth at bedtime.  . Amino Acids-Protein Hydrolys (FEEDING SUPPLEMENT, PRO-STAT SUGAR FREE 64,) LIQD Take 30 mLs by mouth 2 (two) times daily between meals.  . busPIRone (BUSPAR) 5 MG tablet Take 5 mg by mouth 2 (two) times daily.  . cephALEXin (KEFLEX) 500 MG capsule Take 500 mg by mouth 2 (two) times daily.  . collagenase (SANTYL) ointment Apply 1 application topically daily. Apply to sacral wound daily as ordered.  . diltiazem (TIAZAC) 240 MG 24 hr capsule Take 240 mg by mouth daily.  Marland Kitchen levothyroxine (SYNTHROID, LEVOTHROID) 50 MCG tablet Take 50 mcg by mouth daily before breakfast.  . NON FORMULARY Diet Type Change: Dysphagia 2, thin liquids ( moisten meats)  .  polyethylene glycol (MIRALAX / GLYCOLAX) 17 g packet Take 17 g by mouth daily as needed for mild constipation.  . QUEtiapine (SEROQUEL) 25 MG tablet Take 25 mg by mouth every morning. At 9:00 am  . QUEtiapine Fumarate (SEROQUEL PO) Take 12.5 mg by mouth at bedtime.  . [DISCONTINUED] enoxaparin (LOVENOX) 40 MG/0.4ML injection Inject 0.4 mLs (40 mg total) into the skin daily.   No facility-administered encounter medications on file as of 10/25/2018.      SIGNIFICANT DIAGNOSTIC EXAMS   PREVIOUS    10-03-18: left hip and pelvis x-ray: Mildly comminuted, foreshortened and slightly valgus angulated transcervical left femoral neck fracture. Remote appearing posttraumatic deformity of the left inferior and superior pubic ramus  10-03-18: chest x-ray:  1. No active disease.  Likely chronic coarse interstitial changes. 2. Question projectional irregularity versus fracture of the left humerus. Correlate with point tenderness and consider dedicated shoulder radiographs if there is clinical concern  10-03-18: left humerus x-ray: Age indeterminate fracture of the left humeral neck. Clinical correlation is recommended.  10-05-18: pelvic x-ray:  1. Interval left hip replacement with expected surgical change 2. Chronic deformities of the left superior and inferior pubic rami  NO NEW EXAMS.    LABS REVIEWED PREVIOUS;   03-17-18: wbc 5.7; hgb 12.4; hct 38.8; mcv 108.1; plt 224; glucose 85; bun 13; creat 0.91; k+ 3.3; na++ 138; ca 8.7 protein 5.7 albumin 3.1 hgb 4.8 03-20-18: glucose 74; bun 14; creat 0.81; k+ 3.5; na++ 137; ca 9.2 04-19-28: tsh 93.852 05-18-18: tsh 31.786 06-29-18: vit B 12: 308; tsh 14.789 07-11-18: wbc 4.7; hgb 12.9; hct 39.1; mcv 104.5; plt 146 ; glucose 89; bun 24; creat 0.74; k+ 3.9; na++ 141; ca 9.0 liver normal albumin 3.2 tsh 13.945 free T4: 0.78 08-01-18: tsh 6.297 free T3; 2.2 free T4: 1.13 10-03-18: wbc 8.4; hgb 15.6; hct 48.0; mcv 99.2 plt 160; glucose 124; bun 30; creat 0.81; k+  4.3; na++ 142; ca 9.6 10-06-18: wbc 9.9; hgb 15.3; hct 46.1; mcv 99.1; plt 121; glucose 134; bun 15; creat 0.66; k+ 3.8; na++ 140; ca 8.8 10-09-18: wbc 7.8; hgb 13.2; hct 39.0; mcv 98.7 plt 135  NO NEW LABS.    Review of Systems  Unable to perform ROS: Dementia (unable to participate )     Physical Exam Constitutional:      General: She is not in acute distress.    Appearance: She is underweight. She  is not diaphoretic.  Neck:     Musculoskeletal: Neck supple.     Thyroid: No thyromegaly.  Cardiovascular:     Rate and Rhythm: Normal rate and regular rhythm.     Pulses: Normal pulses.     Heart sounds: Normal heart sounds.     Comments: History of heart stent Pulmonary:     Effort: Pulmonary effort is normal. No respiratory distress.     Breath sounds: Normal breath sounds.  Abdominal:     General: Bowel sounds are normal. There is no distension.     Palpations: Abdomen is soft.     Tenderness: There is no abdominal tenderness.  Genitourinary:    Comments: Foley  Musculoskeletal:     Right lower leg: No edema.     Left lower leg: No edema.     Comments: Is able to move all extremities  Is status post left hip hemi arthroplasty 10-05-18 Is status post left humeral fracture History of bilateral knee replacements     Lymphadenopathy:     Cervical: No cervical adenopathy.  Skin:    General: Skin is warm and dry.     Comments: Incision line without signs of infection present unstagable sacrum wound: 6 x 2.4 x 0.1 cm with slough in the wound bed.   Neurological:     Mental Status: She is alert. Mental status is at baseline.  Psychiatric:        Mood and Affect: Mood normal.     ASSESSMENT/ PLAN:  TODAY;   1. Atrial fibrillation with RVR 2.  Closed displaced fracture of left femoral neck  3. Advanced dementia 4. Psychosis in elderly with behavioral disturbance  Will continue current medications Will continue current plan of care Will continue to monitor her  status.      MD is aware of resident's narcotic use and is in agreement with current plan of care. We will attempt to wean resident as appropriate.  Ok Edwards NP [facility]  Contact [phone] Monday through Friday 8am- 5pm  After hours call [phone]

## 2018-10-26 ENCOUNTER — Other Ambulatory Visit: Payer: Self-pay | Admitting: Adult Health

## 2018-10-26 ENCOUNTER — Non-Acute Institutional Stay (SKILLED_NURSING_FACILITY): Payer: Medicare HMO | Admitting: Adult Health

## 2018-10-26 ENCOUNTER — Encounter: Payer: Self-pay | Admitting: Adult Health

## 2018-10-26 DIAGNOSIS — F03C0 Unspecified dementia, severe, without behavioral disturbance, psychotic disturbance, mood disturbance, and anxiety: Secondary | ICD-10-CM

## 2018-10-26 DIAGNOSIS — F03918 Unspecified dementia, unspecified severity, with other behavioral disturbance: Secondary | ICD-10-CM

## 2018-10-26 DIAGNOSIS — S72002A Fracture of unspecified part of neck of left femur, initial encounter for closed fracture: Secondary | ICD-10-CM | POA: Diagnosis not present

## 2018-10-26 DIAGNOSIS — F0391 Unspecified dementia with behavioral disturbance: Secondary | ICD-10-CM

## 2018-10-26 DIAGNOSIS — S42215S Unspecified nondisplaced fracture of surgical neck of left humerus, sequela: Secondary | ICD-10-CM

## 2018-10-26 DIAGNOSIS — F0390 Unspecified dementia without behavioral disturbance: Secondary | ICD-10-CM | POA: Diagnosis not present

## 2018-10-26 MED ORDER — TRAMADOL HCL 50 MG PO TABS: 50.0000 mg | ORAL_TABLET | Freq: Three times a day (TID) | ORAL | 0 refills | Status: DC

## 2018-10-26 NOTE — Progress Notes (Signed)
Location:    [facility] Room Number: 140/W Place of Service:  SNF (31)   CODE STATUS: DNR  Allergies  Allergen Reactions  . Levofloxacin     unknown    Chief Complaint  Patient presents with  . Acute Visit    Psycho Active Medication Review    HPI:  We have come together to review her psychoactive medications. She is presently taking buspar 5 mg twice daily and seroquel 25 mg in the AM and 12.5 mg in the PM. She has recently had a hospitalization for a left hip fracture. She had gone through room changes as well. She is restless. She has had a fall since her readmission to this facility. She will need to have her seroquel to 12.5 mg twice daily. Her restlessness could very well be related to pain management. There are no reports of appetite changes. Her weight has no significant change.    Past Medical History:  Diagnosis Date  . Alcohol abuse 02/07/2018  . Atrial fibrillation ([place])   . CHF (congestive heart failure) ([employer]) 02/07/2018  . Dementia ([place]) 02/07/2018  . GERD (gastroesophageal reflux disease)   . High cholesterol   . Hypertension   . Lung cancer ([place])   . Psychosis ([place]) 04/08/2018  . Seizures ([place]) 02/07/2018  . Thyroid disease   . TIA (transient ischemic attack)     Past Surgical History:  Procedure Laterality Date  . LOBECTOMY    . REPLACEMENT TOTAL KNEE Bilateral   . TOTAL HIP ARTHROPLASTY Left 10/05/2018   Procedure: HEMI HIP ARTHROPLASTY ANTERIOR APPROACH;  Surgeon: Leandrew Koyanagi, MD;  Location: [facility];  Service: Orthopedics;  Laterality: Left;    Social History   Socioeconomic History  . Marital status: Widowed    Spouse name: Not on file  . Number of children: Not on file  . Years of education: Not on file  . Highest education level: Not on file  Occupational History  . Not on file  Social Needs  . Financial resource strain: Not on file  . Food insecurity    Worry: Not on file    Inability: Not on file  .  Transportation needs    Medical: Not on file    Non-medical: Not on file  Tobacco Use  . Smoking status: Never Smoker  . Smokeless tobacco: Never Used  Substance and Sexual Activity  . Alcohol use: Not Currently  . Drug use: No  . Sexual activity: Not Currently  Lifestyle  . Physical activity    Days per week: Not on file    Minutes per session: Not on file  . Stress: Not on file  Relationships  . Social Herbalist on phone: Not on file    Gets together: Not on file    Attends religious service: Not on file    Active member of club or organization: Not on file    Attends meetings of clubs or organizations: Not on file    Relationship status: Not on file  . Intimate partner violence    Fear of current or ex partner: Not on file    Emotionally abused: Not on file    Physically abused: Not on file    Forced sexual activity: Not on file  Other Topics Concern  . Not on file  Social History Narrative  . Not on file   Family History  Problem Relation Age of Onset  . Heart attack Father  70s      VITAL SIGNS BP 128/62   Pulse 60   Temp 98.3 F (36.8 C) (Oral)   Resp 20   Ht 5\' 2"  (1.575 m)   Wt 90 lb 3.2 oz (40.9 kg)   BMI 16.50 kg/m   Outpatient Encounter Medications as of 10/26/2018  Medication Sig  . ALPRAZolam (XANAX) 0.25 MG tablet Take 1 tablet (0.25 mg total) by mouth at bedtime.  . Amino Acids-Protein Hydrolys (FEEDING SUPPLEMENT, PRO-STAT SUGAR FREE 64,) LIQD Take 30 mLs by mouth 2 (two) times daily between meals.  . busPIRone (BUSPAR) 5 MG tablet Take 5 mg by mouth 2 (two) times daily.  . cephALEXin (KEFLEX) 500 MG capsule Take 500 mg by mouth 2 (two) times daily.  . collagenase (SANTYL) ointment Apply 1 application topically daily. Apply to sacral wound daily as ordered.  . diltiazem (TIAZAC) 240 MG 24 hr capsule Take 240 mg by mouth daily.  Marland Kitchen levothyroxine (SYNTHROID, LEVOTHROID) 50 MCG tablet Take 50 mcg by mouth daily before breakfast.   . NON FORMULARY Diet Type Change: Dysphagia 2, thin liquids ( moisten meats)  . polyethylene glycol (MIRALAX / GLYCOLAX) 17 g packet Take 17 g by mouth daily as needed for mild constipation.  . QUEtiapine (SEROQUEL) 25 MG tablet Take 25 mg by mouth every morning. At 9:00 am  . QUEtiapine Fumarate (SEROQUEL PO) Take 12.5 mg by mouth at bedtime.   No facility-administered encounter medications on file as of 10/26/2018.      SIGNIFICANT DIAGNOSTIC EXAMS   PREVIOUS    10-03-18: left hip and pelvis x-ray: Mildly comminuted, foreshortened and slightly valgus angulated transcervical left femoral neck fracture. Remote appearing posttraumatic deformity of the left inferior and superior pubic ramus  10-03-18: chest x-ray:  1. No active disease.  Likely chronic coarse interstitial changes. 2. Question projectional irregularity versus fracture of the left humerus. Correlate with point tenderness and consider dedicated shoulder radiographs if there is clinical concern  10-03-18: left humerus x-ray: Age indeterminate fracture of the left humeral neck. Clinical correlation is recommended.  10-05-18: pelvic x-ray:  1. Interval left hip replacement with expected surgical change 2. Chronic deformities of the left superior and inferior pubic rami  NO NEW EXAMS.    LABS REVIEWED PREVIOUS;   03-17-18: wbc 5.7; hgb 12.4; hct 38.8; mcv 108.1; plt 224; glucose 85; bun 13; creat 0.91; k+ 3.3; na++ 138; ca 8.7 protein 5.7 albumin 3.1 hgb 4.8 03-20-18: glucose 74; bun 14; creat 0.81; k+ 3.5; na++ 137; ca 9.2 04-19-28: tsh 93.852 05-18-18: tsh 31.786 06-29-18: vit B 12: 308; tsh 14.789 07-11-18: wbc 4.7; hgb 12.9; hct 39.1; mcv 104.5; plt 146 ; glucose 89; bun 24; creat 0.74; k+ 3.9; na++ 141; ca 9.0 liver normal albumin 3.2 tsh 13.945 free T4: 0.78 08-01-18: tsh 6.297 free T3; 2.2 free T4: 1.13 10-03-18: wbc 8.4; hgb 15.6; hct 48.0; mcv 99.2 plt 160; glucose 124; bun 30; creat 0.81; k+ 4.3; na++ 142; ca 9.6 10-06-18:  wbc 9.9; hgb 15.3; hct 46.1; mcv 99.1; plt 121; glucose 134; bun 15; creat 0.66; k+ 3.8; na++ 140; ca 8.8 10-09-18: wbc 7.8; hgb 13.2; hct 39.0; mcv 98.7 plt 135  NO NEW LABS.   Review of Systems  Unable to perform ROS: Dementia (unable to participate )    Physical Exam Constitutional:      General: She is not in acute distress.    Appearance: She is underweight. She is not diaphoretic.  Neck:  Musculoskeletal: Neck supple.     Thyroid: No thyromegaly.  Cardiovascular:     Rate and Rhythm: Normal rate and regular rhythm.     Pulses: Normal pulses.     Heart sounds: Normal heart sounds.     Comments: History of heart stent Pulmonary:     Effort: Pulmonary effort is normal. No respiratory distress.     Breath sounds: Normal breath sounds.  Abdominal:     General: Bowel sounds are normal. There is no distension.     Palpations: Abdomen is soft.     Tenderness: There is no abdominal tenderness.  Genitourinary:    Comments: Foley  Musculoskeletal:     Right lower leg: No edema.     Left lower leg: No edema.     Comments: Is able to move all extremities  Is status post left hip hemi arthroplasty 10-05-18 Is status post left humeral fracture History of bilateral knee replacements      Lymphadenopathy:     Cervical: No cervical adenopathy.  Skin:    General: Skin is warm and dry.     Comments: Incision line without signs of infection present unstagable sacrum wound: 6 x 2.4 x 0.1 cm with slough in the wound bed. Bruising on right arm   Neurological:     Mental Status: She is alert. Mental status is at baseline.  Psychiatric:     Comments: Is restless       ASSESSMENT/ PLAN:  TODAY;   1. Psychosis in elderly with behavioral disturbance 2. Advanced dementia 3. Closed displaced fracture of left femoral neck  4. Closed nondisplaced fracture of surgical neck of left humerus; unspecified morphology sequela  Will lower her seroquel to 12.5 mg twice daily due to her  falls Will begin ultram 50 mg every 8 hours for pain management Will monitor her status.       MD is aware of resident's narcotic use and is in agreement with current plan of care. We will attempt to wean resident as appropriate.  Ok Edwards NP [facility]  Contact [phone] Monday through Friday 8am- 5pm  After hours call [phone]

## 2018-11-02 ENCOUNTER — Ambulatory Visit (INDEPENDENT_AMBULATORY_CARE_PROVIDER_SITE_OTHER): Payer: Medicare HMO | Admitting: Orthopaedic Surgery

## 2018-11-02 ENCOUNTER — Inpatient Hospital Stay (INDEPENDENT_AMBULATORY_CARE_PROVIDER_SITE_OTHER): Payer: Medicare HMO

## 2018-11-02 ENCOUNTER — Encounter: Payer: Self-pay | Admitting: Orthopaedic Surgery

## 2018-11-02 VITALS — Ht 62.0 in | Wt 90.2 lb

## 2018-11-02 DIAGNOSIS — S72002A Fracture of unspecified part of neck of left femur, initial encounter for closed fracture: Secondary | ICD-10-CM

## 2018-11-02 NOTE — Progress Notes (Signed)
   Post-Op Visit Note   Patient: Casey Wood           Date of Birth: 07-29-1922           MRN: [id number] Visit Date: 11/02/2018 PCP: Hennie Duos, MD   Assessment & Plan:  Chief Complaint:  Chief Complaint  Patient presents with  . Left Hip - Routine Post Op    10/05/2018 left hip arthro   Visit Diagnoses:  1. Left displaced femoral neck fracture ([place])     Plan: Yaneliz is 4 weeks status post left partial hip replacement for femoral neck fracture.  She has been doing well.  Last visit we drained the seroma and placed her on 2 weeks of Keflex.  She has been doing physical therapy.  Denies any pain.  She is accompanied by her daughter.  The surgical scar is healed.  She has minimal swelling around her hip region.  She has no pain with hip range of motion.  The x-rays are uncomplicated.  At this point she is to continue with PT for strengthening.  I like to recheck her one more time in about 6 weeks with AP and lateral x-rays of the left hip.  Follow-Up Instructions: Return in about 6 weeks (around 12/14/2018).   Orders:  Orders Placed This Encounter  Procedures  . XR HIP UNILAT W OR W/O PELVIS 2-3 VIEWS LEFT   No orders of the defined types were placed in this encounter.   Imaging: Xr Hip Unilat W Or W/o Pelvis 2-3 Views Left  Result Date: 11/02/2018 Stable left partial hip replacement without complication.   PMFS History: Patient Active Problem List   Diagnosis Date Noted  . Closed left humeral fracture 10/14/2018  . Goals of care, counseling/discussion   . Palliative care by specialist   . Left displaced femoral neck fracture ([place]) 10/03/2018  . Left ankle sprain 06/02/2018  . Chronic anxiety 05/11/2018  . Acquired hypothyroidism 04/25/2018  . Psychosis in elderly with behavioral disturbance ([place]) 04/08/2018  . Atrial fibrillation with RVR ([place]) 02/07/2018  . Advanced dementia ([place]) 02/07/2018  . Seizures ([place]) 02/07/2018  . Alcohol abuse 02/07/2018   . CHF (congestive heart failure) ([place]) 02/07/2018   Past Medical History:  Diagnosis Date  . Alcohol abuse 02/07/2018  . Atrial fibrillation ([place])   . CHF (congestive heart failure) ([place]) 02/07/2018  . Dementia ([place]) 02/07/2018  . GERD (gastroesophageal reflux disease)   . High cholesterol   . Hypertension   . Lung cancer ([place])   . Psychosis ([place]) 04/08/2018  . Seizures ([place]) 02/07/2018  . Thyroid disease   . TIA (transient ischemic attack)     Family History  Problem Relation Age of Onset  . Heart attack Father        59s    Past Surgical History:  Procedure Laterality Date  . LOBECTOMY    . REPLACEMENT TOTAL KNEE Bilateral   . TOTAL HIP ARTHROPLASTY Left 10/05/2018   Procedure: HEMI HIP ARTHROPLASTY ANTERIOR APPROACH;  Surgeon: Leandrew Koyanagi, MD;  Location: [facility];  Service: Orthopedics;  Laterality: Left;   Social History   Occupational History  . Not on file  Tobacco Use  . Smoking status: Never Smoker  . Smokeless tobacco: Never Used  Substance and Sexual Activity  . Alcohol use: Not Currently  . Drug use: No  . Sexual activity: Not Currently

## 2018-11-07 ENCOUNTER — Non-Acute Institutional Stay (SKILLED_NURSING_FACILITY): Payer: Medicare HMO | Admitting: Adult Health

## 2018-11-07 ENCOUNTER — Encounter: Payer: Self-pay | Admitting: Adult Health

## 2018-11-07 DIAGNOSIS — L89150 Pressure ulcer of sacral region, unstageable: Secondary | ICD-10-CM

## 2018-11-07 NOTE — Progress Notes (Signed)
Location:    [facility] Room Number: 140/W Place of Service:  SNF (31)   CODE STATUS: DNR  Allergies  Allergen Reactions  . Levofloxacin     unknown    Chief Complaint  Patient presents with  . Acute Visit    Wound Management    HPI:  She has an unstagable wound on her sacrum. There are no reports of fevers; no pain. There are no signs of infection present.   Past Medical History:  Diagnosis Date  . Alcohol abuse 02/07/2018  . Atrial fibrillation ([place])   . CHF (congestive heart failure) ([place]) 02/07/2018  . Dementia ([place]) 02/07/2018  . GERD (gastroesophageal reflux disease)   . High cholesterol   . Hypertension   . Lung cancer ([place])   . Psychosis ([place]) 04/08/2018  . Seizures ([place]) 02/07/2018  . Thyroid disease   . TIA (transient ischemic attack)     Past Surgical History:  Procedure Laterality Date  . LOBECTOMY    . REPLACEMENT TOTAL KNEE Bilateral   . TOTAL HIP ARTHROPLASTY Left 10/05/2018   Procedure: HEMI HIP ARTHROPLASTY ANTERIOR APPROACH;  Surgeon: Leandrew Koyanagi, MD;  Location: [facility];  Service: Orthopedics;  Laterality: Left;    Social History   Socioeconomic History  . Marital status: Widowed    Spouse name: Not on file  . Number of children: Not on file  . Years of education: Not on file  . Highest education level: Not on file  Occupational History  . Not on file  Social Needs  . Financial resource strain: Not on file  . Food insecurity    Worry: Not on file    Inability: Not on file  . Transportation needs    Medical: Not on file    Non-medical: Not on file  Tobacco Use  . Smoking status: Never Smoker  . Smokeless tobacco: Never Used  Substance and Sexual Activity  . Alcohol use: Not Currently  . Drug use: No  . Sexual activity: Not Currently  Lifestyle  . Physical activity    Days per week: Not on file    Minutes per session: Not on file  . Stress: Not on file  Relationships  . Social Herbalist on  phone: Not on file    Gets together: Not on file    Attends religious service: Not on file    Active member of club or organization: Not on file    Attends meetings of clubs or organizations: Not on file    Relationship status: Not on file  . Intimate partner violence    Fear of current or ex partner: Not on file    Emotionally abused: Not on file    Physically abused: Not on file    Forced sexual activity: Not on file  Other Topics Concern  . Not on file  Social History Narrative  . Not on file   Family History  Problem Relation Age of Onset  . Heart attack Father        38s      VITAL SIGNS BP (!) 100/58   Pulse 69   Temp 97.8 F (36.6 C) (Oral)   Resp 18   Ht 5\' 2"  (1.575 m)   Wt 90 lb 3.2 oz (40.9 kg)   BMI 16.50 kg/m   Outpatient Encounter Medications as of 11/07/2018  Medication Sig  . ALPRAZolam (XANAX) 0.25 MG tablet Take 1 tablet (0.25 mg total) by mouth  at bedtime.  . Amino Acids-Protein Hydrolys (FEEDING SUPPLEMENT, PRO-STAT SUGAR FREE 64,) LIQD Take 30 mLs by mouth 2 (two) times daily between meals.  . busPIRone (BUSPAR) 5 MG tablet Take 5 mg by mouth 2 (two) times daily.  . collagenase (SANTYL) ointment Apply 1 application topically daily. Apply to sacral wound daily as ordered.  . diltiazem (TIAZAC) 240 MG 24 hr capsule Take 240 mg by mouth daily.  Marland Kitchen levothyroxine (SYNTHROID, LEVOTHROID) 50 MCG tablet Take 50 mcg by mouth daily before breakfast.  . NON FORMULARY Diet Type Change: Dysphagia 2, thin liquids ( moisten meats)  . polyethylene glycol (MIRALAX / GLYCOLAX) 17 g packet Take 17 g by mouth daily as needed for mild constipation.  . QUEtiapine Fumarate (SEROQUEL PO) Take 12.5 mg by mouth 2 (two) times daily.   . traMADol (ULTRAM) 50 MG tablet Take 1 tablet (50 mg total) by mouth every 8 (eight) hours.  . [DISCONTINUED] QUEtiapine (SEROQUEL) 25 MG tablet Take 25 mg by mouth every morning. At 9:00 am   No facility-administered encounter medications on  file as of 11/07/2018.      SIGNIFICANT DIAGNOSTIC EXAMS   PREVIOUS    10-03-18: left hip and pelvis x-ray: Mildly comminuted, foreshortened and slightly valgus angulated transcervical left femoral neck fracture. Remote appearing posttraumatic deformity of the left inferior and superior pubic ramus  10-03-18: chest x-ray:  1. No active disease.  Likely chronic coarse interstitial changes. 2. Question projectional irregularity versus fracture of the left humerus. Correlate with point tenderness and consider dedicated shoulder radiographs if there is clinical concern  10-03-18: left humerus x-ray: Age indeterminate fracture of the left humeral neck. Clinical correlation is recommended.  10-05-18: pelvic x-ray:  1. Interval left hip replacement with expected surgical change 2. Chronic deformities of the left superior and inferior pubic rami  NO NEW EXAMS.    LABS REVIEWED PREVIOUS;   03-17-18: wbc 5.7; hgb 12.4; hct 38.8; mcv 108.1; plt 224; glucose 85; bun 13; creat 0.91; k+ 3.3; na++ 138; ca 8.7 protein 5.7 albumin 3.1 hgb 4.8 03-20-18: glucose 74; bun 14; creat 0.81; k+ 3.5; na++ 137; ca 9.2 04-19-28: tsh 93.852 05-18-18: tsh 31.786 06-29-18: vit B 12: 308; tsh 14.789 07-11-18: wbc 4.7; hgb 12.9; hct 39.1; mcv 104.5; plt 146 ; glucose 89; bun 24; creat 0.74; k+ 3.9; na++ 141; ca 9.0 liver normal albumin 3.2 tsh 13.945 free T4: 0.78 08-01-18: tsh 6.297 free T3; 2.2 free T4: 1.13 10-03-18: wbc 8.4; hgb 15.6; hct 48.0; mcv 99.2 plt 160; glucose 124; bun 30; creat 0.81; k+ 4.3; na++ 142; ca 9.6 10-06-18: wbc 9.9; hgb 15.3; hct 46.1; mcv 99.1; plt 121; glucose 134; bun 15; creat 0.66; k+ 3.8; na++ 140; ca 8.8 10-09-18: wbc 7.8; hgb 13.2; hct 39.0; mcv 98.7 plt 135  NO NEW LABS.    Review of Systems  Unable to perform ROS: Dementia (unable to participate )    Physical Exam Constitutional:      General: She is not in acute distress.    Appearance: She is underweight. She is not diaphoretic.   Neck:     Musculoskeletal: Neck supple.     Thyroid: No thyromegaly.  Cardiovascular:     Rate and Rhythm: Normal rate and regular rhythm.     Pulses: Normal pulses.     Heart sounds: Normal heart sounds.     Comments: History of heart stent Pulmonary:     Effort: Pulmonary effort is normal. No respiratory distress.  Breath sounds: Normal breath sounds.  Abdominal:     General: Bowel sounds are normal. There is no distension.     Palpations: Abdomen is soft.     Tenderness: There is no abdominal tenderness.  Genitourinary:    Comments: Foley  Musculoskeletal:     Right lower leg: No edema.     Left lower leg: No edema.     Comments: Is able to move all extremities  Is status post left hip hemi arthroplasty 10-05-18 Is status post left humeral fracture History of bilateral knee replacements       Lymphadenopathy:     Cervical: No cervical adenopathy.  Skin:    General: Skin is warm and dry.     Comments: unstagable sacral ulceration: 2 x 0.3 x 0.1 cm slough present in wound bed.   Neurological:     Mental Status: She is alert. Mental status is at baseline.  Psychiatric:        Mood and Affect: Mood normal.      ASSESSMENT/ PLAN:  TODAY;   1. unstagable pressure ulceration of sacral region: is stable will continue her current wound treatment and will monitor her status.      MD is aware of resident's narcotic use and is in agreement with current plan of care. We will attempt to wean resident as appropriate.  Ok Edwards NP [facility]  Contact [phone] Monday through Friday 8am- 5pm  After hours call [phone]

## 2018-11-11 DIAGNOSIS — L89150 Pressure ulcer of sacral region, unstageable: Secondary | ICD-10-CM | POA: Insufficient documentation

## 2018-11-15 ENCOUNTER — Non-Acute Institutional Stay (SKILLED_NURSING_FACILITY): Payer: Medicare HMO | Admitting: Adult Health

## 2018-11-15 ENCOUNTER — Encounter: Payer: Self-pay | Admitting: Adult Health

## 2018-11-15 DIAGNOSIS — E039 Hypothyroidism, unspecified: Secondary | ICD-10-CM

## 2018-11-15 DIAGNOSIS — F03C0 Unspecified dementia, severe, without behavioral disturbance, psychotic disturbance, mood disturbance, and anxiety: Secondary | ICD-10-CM

## 2018-11-15 DIAGNOSIS — F0390 Unspecified dementia without behavioral disturbance: Secondary | ICD-10-CM | POA: Diagnosis not present

## 2018-11-15 DIAGNOSIS — R569 Unspecified convulsions: Secondary | ICD-10-CM

## 2018-11-15 NOTE — Progress Notes (Signed)
Location:    [facility] Room Number: 140/W Place of Service:  SNF (31)   CODE STATUS: DNR  Allergies  Allergen Reactions  . Levofloxacin     unknown   Chief Complaint  Patient presents with  . Medical Management of Chronic Issues        Acquired hypothyroidism: Advanced dementia:  Seizure:      HPI:  She is a 85 year old long term resident of this facility being seen for the management of her chronic illnesses; hypothyroidism; dementia seizures. There are no reports of agitation or anxiety. No reports of uncontrolled pain; no reports of cough or shortness of breath  Past Medical History:  Diagnosis Date  . Alcohol abuse 02/07/2018  . Atrial fibrillation ([place])   . CHF (congestive heart failure) ([place]) 02/07/2018  . Dementia ([place]) 02/07/2018  . GERD (gastroesophageal reflux disease)   . High cholesterol   . Hypertension   . Lung cancer ([place])   . Psychosis ([place]) 04/08/2018  . Seizures ([place]) 02/07/2018  . Thyroid disease   . TIA (transient ischemic attack)     Past Surgical History:  Procedure Laterality Date  . LOBECTOMY    . REPLACEMENT TOTAL KNEE Bilateral   . TOTAL HIP ARTHROPLASTY Left 10/05/2018   Procedure: HEMI HIP ARTHROPLASTY ANTERIOR APPROACH;  Surgeon: Leandrew Koyanagi, MD;  Location: [facility];  Service: Orthopedics;  Laterality: Left;    Social History   Socioeconomic History  . Marital status: Widowed    Spouse name: Not on file  . Number of children: Not on file  . Years of education: Not on file  . Highest education level: Not on file  Occupational History  . Not on file  Social Needs  . Financial resource strain: Not on file  . Food insecurity    Worry: Not on file    Inability: Not on file  . Transportation needs    Medical: Not on file    Non-medical: Not on file  Tobacco Use  . Smoking status: Never Smoker  . Smokeless tobacco: Never Used  Substance and Sexual Activity  . Alcohol use: Not Currently  . Drug use: No   . Sexual activity: Not Currently  Lifestyle  . Physical activity    Days per week: Not on file    Minutes per session: Not on file  . Stress: Not on file  Relationships  . Social Herbalist on phone: Not on file    Gets together: Not on file    Attends religious service: Not on file    Active member of club or organization: Not on file    Attends meetings of clubs or organizations: Not on file    Relationship status: Not on file  . Intimate partner violence    Fear of current or ex partner: Not on file    Emotionally abused: Not on file    Physically abused: Not on file    Forced sexual activity: Not on file  Other Topics Concern  . Not on file  Social History Narrative  . Not on file   Family History  Problem Relation Age of Onset  . Heart attack Father        25s      VITAL SIGNS BP (!) 100/58   Pulse 69   Temp (!) 96 F (35.6 C) (Oral)   Resp 18   Ht 5\' 2"  (1.575 m)   Wt 90 lb 3.2  oz (40.9 kg)   BMI 16.50 kg/m   Outpatient Encounter Medications as of 11/15/2018  Medication Sig  . ALPRAZolam (XANAX) 0.25 MG tablet Take 1 tablet (0.25 mg total) by mouth at bedtime.  . Amino Acids-Protein Hydrolys (FEEDING SUPPLEMENT, PRO-STAT SUGAR FREE 64,) LIQD Take 30 mLs by mouth 2 (two) times daily between meals.  . busPIRone (BUSPAR) 5 MG tablet Take 5 mg by mouth 2 (two) times daily.  . collagenase (SANTYL) ointment Apply 1 application topically daily. Apply to sacral wound daily as ordered.  . diltiazem (TIAZAC) 240 MG 24 hr capsule Take 240 mg by mouth daily.  Marland Kitchen levothyroxine (SYNTHROID, LEVOTHROID) 50 MCG tablet Take 50 mcg by mouth daily before breakfast.  . NON FORMULARY Diet Type Change: change diet to puree  . polyethylene glycol (MIRALAX / GLYCOLAX) 17 g packet Take 17 g by mouth daily as needed for mild constipation.  . QUEtiapine Fumarate (SEROQUEL PO) Take 12.5 mg by mouth 2 (two) times daily.   . traMADol (ULTRAM) 50 MG tablet Take 1 tablet (50 mg  total) by mouth every 8 (eight) hours.   No facility-administered encounter medications on file as of 11/15/2018.      SIGNIFICANT DIAGNOSTIC EXAMS   PREVIOUS    10-03-18: left hip and pelvis x-ray: Mildly comminuted, foreshortened and slightly valgus angulated transcervical left femoral neck fracture. Remote appearing posttraumatic deformity of the left inferior and superior pubic ramus  10-03-18: chest x-ray:  1. No active disease.  Likely chronic coarse interstitial changes. 2. Question projectional irregularity versus fracture of the left humerus. Correlate with point tenderness and consider dedicated shoulder radiographs if there is clinical concern  10-03-18: left humerus x-ray: Age indeterminate fracture of the left humeral neck. Clinical correlation is recommended.  10-05-18: pelvic x-ray:  1. Interval left hip replacement with expected surgical change 2. Chronic deformities of the left superior and inferior pubic rami  NO NEW EXAMS.    LABS REVIEWED PREVIOUS;   03-17-18: wbc 5.7; hgb 12.4; hct 38.8; mcv 108.1; plt 224; glucose 85; bun 13; creat 0.91; k+ 3.3; na++ 138; ca 8.7 protein 5.7 albumin 3.1 hgb 4.8 03-20-18: glucose 74; bun 14; creat 0.81; k+ 3.5; na++ 137; ca 9.2 04-19-28: tsh 93.852 05-18-18: tsh 31.786 06-29-18: vit B 12: 308; tsh 14.789 07-11-18: wbc 4.7; hgb 12.9; hct 39.1; mcv 104.5; plt 146 ; glucose 89; bun 24; creat 0.74; k+ 3.9; na++ 141; ca 9.0 liver normal albumin 3.2 tsh 13.945 free T4: 0.78 08-01-18: tsh 6.297 free T3; 2.2 free T4: 1.13 10-03-18: wbc 8.4; hgb 15.6; hct 48.0; mcv 99.2 plt 160; glucose 124; bun 30; creat 0.81; k+ 4.3; na++ 142; ca 9.6 10-06-18: wbc 9.9; hgb 15.3; hct 46.1; mcv 99.1; plt 121; glucose 134; bun 15; creat 0.66; k+ 3.8; na++ 140; ca 8.8 10-09-18: wbc 7.8; hgb 13.2; hct 39.0; mcv 98.7 plt 135  NO NEW LABS.   Review of Systems  Unable to perform ROS: Dementia (unable to participate )   Physical Exam Constitutional:      General:  She is not in acute distress.    Appearance: She is underweight. She is not diaphoretic.  Neck:     Musculoskeletal: Neck supple.     Thyroid: No thyromegaly.  Cardiovascular:     Rate and Rhythm: Normal rate and regular rhythm.     Pulses: Normal pulses.     Heart sounds: Normal heart sounds.     Comments: History of heart stent Pulmonary:  Effort: Pulmonary effort is normal. No respiratory distress.     Breath sounds: Normal breath sounds.  Abdominal:     General: Bowel sounds are normal. There is no distension.     Palpations: Abdomen is soft.     Tenderness: There is no abdominal tenderness.  Genitourinary:    Comments: Foley  Musculoskeletal:     Right lower leg: No edema.     Left lower leg: No edema.     Comments: Is able to move all extremities  Is status post left hip hemi arthroplasty 10-05-18 Is status post left humeral fracture History of bilateral knee replacements        Lymphadenopathy:     Cervical: No cervical adenopathy.  Skin:    General: Skin is warm and dry.     Comments: unstagable sacral ulceration: 2 x 0.3 x 0.1 cm slough present in wound bed.    Neurological:     Mental Status: She is alert. Mental status is at baseline.  Psychiatric:        Mood and Affect: Mood normal.     ASSESSMENT/ PLAN:  TODAY;   1. Acquired hypothyroidism: is stable tsh 6.297 (free t3: 2.2 free t4: 1.13) will continue synthroid 50 mcg daily will monitor   2. Advanced dementia: is without significant change: weight is 90 pounds; will not make changes will monitor her status.    3. Seizure: no reports of recent seizures; will monitor    PREVIOUS   4. 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 Chronic anxiety: is without change will continue xanax 0.25 mg nightly and buspar 5 mg twice daily   6. Unspecified chronic CHF (congestive heart failure) is compentated will continue to monitor    7. Atrial fibrillation with RVR: heart rate is stable will continue tiazac 250 mg daily for rate control will monitor   8. Closed nondisplaced fracture of surgical neck of left humerus; unspecified morphology; is stable will continue to monitor her status  9. Closed displaced fracture of left femoral neck: is stable her pain is presently being managed on ultram 50 mg every 8 hours.    MD is aware of resident's narcotic use and is in agreement with current plan of care. We will attempt to wean resident as appropriate.  Ok Edwards NP [facility]  Contact [phone] Monday through Friday 8am- 5pm  After hours call [phone]

## 2018-11-24 ENCOUNTER — Other Ambulatory Visit: Payer: Self-pay | Admitting: Adult Health

## 2018-11-24 MED ORDER — TRAMADOL HCL 50 MG PO TABS: 50.0000 mg | ORAL_TABLET | Freq: Three times a day (TID) | ORAL | 0 refills | Status: DC

## 2018-11-30 ENCOUNTER — Other Ambulatory Visit: Payer: Self-pay | Admitting: Adult Health

## 2018-11-30 MED ORDER — ALPRAZOLAM 0.25 MG PO TABS: 0.2500 mg | ORAL_TABLET | Freq: Every day | ORAL | 0 refills | Status: DC

## 2018-12-13 ENCOUNTER — Other Ambulatory Visit: Payer: Self-pay | Admitting: Adult Health

## 2018-12-14 ENCOUNTER — Ambulatory Visit: Payer: Medicare HMO | Admitting: Physician Assistant

## 2018-12-14 ENCOUNTER — Encounter: Payer: Self-pay | Admitting: Adult Health

## 2018-12-14 ENCOUNTER — Non-Acute Institutional Stay (SKILLED_NURSING_FACILITY): Payer: Medicare HMO | Admitting: Adult Health

## 2018-12-14 DIAGNOSIS — F0391 Unspecified dementia with behavioral disturbance: Secondary | ICD-10-CM

## 2018-12-14 DIAGNOSIS — I509 Heart failure, unspecified: Secondary | ICD-10-CM | POA: Diagnosis not present

## 2018-12-14 DIAGNOSIS — F03918 Unspecified dementia, unspecified severity, with other behavioral disturbance: Secondary | ICD-10-CM

## 2018-12-14 DIAGNOSIS — F419 Anxiety disorder, unspecified: Secondary | ICD-10-CM | POA: Diagnosis not present

## 2018-12-14 NOTE — Progress Notes (Signed)
Location:  [facility] Room Number: 140-W Place of Service:  SNF (31)   CODE STATUS: DNR  Allergies  Allergen Reactions  . Levofloxacin     unknown    Chief Complaint  Patient presents with  . Medical Management of Chronic Issues         Psychosis in elderly with behavioral disturbance . Chronic anxiety: Unspecified chronic CHF (congestive heart failure):   Weekly follow up for the first 30 days post hospitalization.     HPI:  She is a 85 year old long term resident of this facility being seen for the management of her chronic illnesses: psychosis; anxiety chf. There are no reports of agitation or anxiety. Her weight is fairly stable at 88 pounds. There are no reports of uncontrolled pain.   Past Medical History:  Diagnosis Date  . Alcohol abuse 02/07/2018  . Atrial fibrillation ([place])   . CHF (congestive heart failure) ([place]) 02/07/2018  . Dementia ([place]) 02/07/2018  . GERD (gastroesophageal reflux disease)   . High cholesterol   . Hypertension   . Lung cancer ([place])   . Psychosis ([place]) 04/08/2018  . Seizures ([place]) 02/07/2018  . Thyroid disease   . TIA (transient ischemic attack)     Past Surgical History:  Procedure Laterality Date  . LOBECTOMY    . REPLACEMENT TOTAL KNEE Bilateral   . TOTAL HIP ARTHROPLASTY Left 10/05/2018   Procedure: HEMI HIP ARTHROPLASTY ANTERIOR APPROACH;  Surgeon: Leandrew Koyanagi, MD;  Location: [facility];  Service: Orthopedics;  Laterality: Left;    Social History   Socioeconomic History  . Marital status: Widowed    Spouse name: Not on file  . Number of children: Not on file  . Years of education: Not on file  . Highest education level: Not on file  Occupational History  . Not on file  Social Needs  . Financial resource strain: Not on file  . Food insecurity    Worry: Not on file    Inability: Not on file  . Transportation needs    Medical: Not on file    Non-medical: Not on file  Tobacco Use  . Smoking status:  Never Smoker  . Smokeless tobacco: Never Used  Substance and Sexual Activity  . Alcohol use: Not Currently  . Drug use: No  . Sexual activity: Not Currently  Lifestyle  . Physical activity    Days per week: Not on file    Minutes per session: Not on file  . Stress: Not on file  Relationships  . Social Herbalist on phone: Not on file    Gets together: Not on file    Attends religious service: Not on file    Active member of club or organization: Not on file    Attends meetings of clubs or organizations: Not on file    Relationship status: Not on file  . Intimate partner violence    Fear of current or ex partner: Not on file    Emotionally abused: Not on file    Physically abused: Not on file    Forced sexual activity: Not on file  Other Topics Concern  . Not on file  Social History Narrative  . Not on file   Family History  Problem Relation Age of Onset  . Heart attack Father        31s      VITAL SIGNS BP 120/64   Pulse 60   Temp (!)  97 F (36.1 C) (Oral)   Resp 20   Ht 5\' 2"  (1.575 m)   Wt 88 lb 12.8 oz (40.3 kg)   BMI 16.24 kg/m   Outpatient Encounter Medications as of 12/14/2018  Medication Sig  . ALPRAZolam (XANAX) 0.25 MG tablet Take 1 tablet (0.25 mg total) by mouth at bedtime.  . Amino Acids-Protein Hydrolys (FEEDING SUPPLEMENT, PRO-STAT SUGAR FREE 64,) LIQD Take 30 mLs by mouth 2 (two) times daily between meals.  . busPIRone (BUSPAR) 5 MG tablet Take 5 mg by mouth 2 (two) times daily.  . collagenase (SANTYL) ointment Apply 1 application topically daily. Apply to sacral wound daily as ordered.  . diltiazem (TIAZAC) 240 MG 24 hr capsule Take 240 mg by mouth daily.  Marland Kitchen levothyroxine (SYNTHROID, LEVOTHROID) 50 MCG tablet Take 50 mcg by mouth daily before breakfast.  . NON FORMULARY Diet Type Change: change diet to puree  . polyethylene glycol (MIRALAX / GLYCOLAX) 17 g packet Take 17 g by mouth daily as needed for mild constipation.  . QUEtiapine  Fumarate (SEROQUEL PO) Take 12.5 mg by mouth 2 (two) times daily.   . traMADol (ULTRAM) 50 MG tablet Take 1 tablet (50 mg total) by mouth every 8 (eight) hours.   No facility-administered encounter medications on file as of 12/14/2018.      SIGNIFICANT DIAGNOSTIC EXAMS   PREVIOUS    10-03-18: left hip and pelvis x-ray: Mildly comminuted, foreshortened and slightly valgus angulated transcervical left femoral neck fracture. Remote appearing posttraumatic deformity of the left inferior and superior pubic ramus  10-03-18: chest x-ray:  1. No active disease.  Likely chronic coarse interstitial changes. 2. Question projectional irregularity versus fracture of the left humerus. Correlate with point tenderness and consider dedicated shoulder radiographs if there is clinical concern  10-03-18: left humerus x-ray: Age indeterminate fracture of the left humeral neck. Clinical correlation is recommended.  10-05-18: pelvic x-ray:  1. Interval left hip replacement with expected surgical change 2. Chronic deformities of the left superior and inferior pubic rami  NO NEW EXAMS.    LABS REVIEWED PREVIOUS;   03-17-18: wbc 5.7; hgb 12.4; hct 38.8; mcv 108.1; plt 224; glucose 85; bun 13; creat 0.91; k+ 3.3; na++ 138; ca 8.7 protein 5.7 albumin 3.1 hgb 4.8 03-20-18: glucose 74; bun 14; creat 0.81; k+ 3.5; na++ 137; ca 9.2 04-19-28: tsh 93.852 05-18-18: tsh 31.786 06-29-18: vit B 12: 308; tsh 14.789 07-11-18: wbc 4.7; hgb 12.9; hct 39.1; mcv 104.5; plt 146 ; glucose 89; bun 24; creat 0.74; k+ 3.9; na++ 141; ca 9.0 liver normal albumin 3.2 tsh 13.945 free T4: 0.78 08-01-18: tsh 6.297 free T3; 2.2 free T4: 1.13 10-03-18: wbc 8.4; hgb 15.6; hct 48.0; mcv 99.2 plt 160; glucose 124; bun 30; creat 0.81; k+ 4.3; na++ 142; ca 9.6 10-06-18: wbc 9.9; hgb 15.3; hct 46.1; mcv 99.1; plt 121; glucose 134; bun 15; creat 0.66; k+ 3.8; na++ 140; ca 8.8 10-09-18: wbc 7.8; hgb 13.2; hct 39.0; mcv 98.7 plt 135  NO NEW LABS.    Review of Systems  Unable to perform ROS: Dementia (unable to participate )   Physical Exam Constitutional:      General: She is not in acute distress.    Appearance: She is well-developed. She is not diaphoretic.  Neck:     Musculoskeletal: Neck supple.     Thyroid: No thyromegaly.  Cardiovascular:     Rate and Rhythm: Normal rate and regular rhythm.     Pulses: Normal pulses.  Heart sounds: Normal heart sounds.     Comments: History cardiac stent Pulmonary:     Effort: Pulmonary effort is normal. No respiratory distress.     Breath sounds: Normal breath sounds.  Abdominal:     General: Bowel sounds are normal. There is no distension.     Palpations: Abdomen is soft.     Tenderness: There is no abdominal tenderness.  Genitourinary:    Comments: Foley  Musculoskeletal:     Right lower leg: No edema.     Left lower leg: No edema.     Comments: Is able to move all extremities  Is status post left hip hemi arthroplasty 10-05-18 Is status post left humeral fracture History of bilateral knee replacements    Lymphadenopathy:     Cervical: No cervical adenopathy.  Skin:    General: Skin is warm and dry.  Neurological:     Mental Status: She is alert. Mental status is at baseline.  Psychiatric:        Mood and Affect: Mood normal.      ASSESSMENT/ PLAN:  TODAY;   1. Psychosis in elderly with behavioral disturbance: is stable will continue seroquel 12. 5 mg twice daily   2. Chronic anxiety: is stable will continue xanax 0.25 mg nightly and buspar 5 mg twice daily   3. Unspecified chronic CHF (congestive heart failure): is compensated will monitor    PREVIOUS   4. Atrial fibrillation with RVR: heart rate is stable will continue tiazac 240 mg daily for rate control will monitor   5. Closed nondisplaced fracture of surgical neck of left humerus; unspecified morphology; is stable will continue to monitor her status  6. Closed displaced fracture of left femoral neck:  is stable her pain is presently being managed on ultram 50 mg every 8 hours.   7. Acquired hypothyroidism: is stable tsh 6.297 (free t3: 2.2 free t4: 1.13) will continue synthroid 50 mcg daily will monitor   8. Advanced dementia: is without significant change: weight is 88 pounds; will not make changes will monitor her status. Unfortunately weight loss is an expected outcome in the late stage of this disease process. She has also suffered a hip fracture this past year.     9. Seizure: no reports of recent seizures; will monitor     MD is aware of resident's narcotic use and is in agreement with current plan of care. We will attempt to wean resident as appropriate.  Ok Edwards NP [facility]  Contact [phone] Monday through Friday 8am- 5pm  After hours call [phone]

## 2018-12-25 ENCOUNTER — Other Ambulatory Visit: Payer: Self-pay | Admitting: Adult Health

## 2018-12-25 MED ORDER — TRAMADOL HCL 50 MG PO TABS: 50.0000 mg | ORAL_TABLET | Freq: Three times a day (TID) | ORAL | 0 refills | Status: DC

## 2018-12-26 ENCOUNTER — Inpatient Hospital Stay (INDEPENDENT_AMBULATORY_CARE_PROVIDER_SITE_OTHER): Payer: Medicare HMO

## 2018-12-26 ENCOUNTER — Ambulatory Visit (INDEPENDENT_AMBULATORY_CARE_PROVIDER_SITE_OTHER): Payer: Medicare HMO | Admitting: Orthopaedic Surgery

## 2018-12-26 ENCOUNTER — Encounter: Payer: Self-pay | Admitting: Orthopaedic Surgery

## 2018-12-26 DIAGNOSIS — S72002A Fracture of unspecified part of neck of left femur, initial encounter for closed fracture: Secondary | ICD-10-CM | POA: Diagnosis not present

## 2018-12-26 NOTE — Progress Notes (Signed)
   Post-Op Visit Note   Patient: Casey Wood           Date of Birth: 10-09-1922           MRN: [id number] Visit Date: 12/26/2018 PCP: Hennie Duos, MD   Assessment & Plan:  Chief Complaint:  Chief Complaint  Patient presents with  . Left Hip - Pain   Visit Diagnoses:  1. Left displaced femoral neck fracture ([place])     Plan: Corley presents today with her daughter from the nursing home for follow-up of her left partial hip replacement.  The daughter states that she is in no pain and has no complaints.  She is not doing much PT. Surgical scar is fully healed.  No pain with range of motion of the hip.  X-rays demonstrate stable partial replacement. At this point patient has recovered well from the surgery.  She has advanced dementia which has limited her ability to do the rehab.  Fortunately she is not in any pain.  From my standpoint we can see her back as needed.  Follow-Up Instructions: Return if symptoms worsen or fail to improve.   Orders:  Orders Placed This Encounter  Procedures  . XR HIP UNILAT W OR W/O PELVIS 2-3 VIEWS LEFT   No orders of the defined types were placed in this encounter.   Imaging: Xr Hip Unilat W Or W/o Pelvis 2-3 Views Left  Result Date: 12/26/2018 Stable left total partial hip replacement without complication.   PMFS History: Patient Active Problem List   Diagnosis Date Noted  . Unstageable pressure ulcer of sacral region ([place]) 11/11/2018  . Closed left humeral fracture 10/14/2018  . Goals of care, counseling/discussion   . Palliative care by specialist   . Left displaced femoral neck fracture ([place]) 10/03/2018  . Left ankle sprain 06/02/2018  . Chronic anxiety 05/11/2018  . Acquired hypothyroidism 04/25/2018  . 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 Atrial fibrillation with RVR ([place]) 02/07/2018  . Advanced dementia ([place]) 02/07/2018  . Seizures ([place]) 02/07/2018  . Alcohol abuse 02/07/2018  . CHF  (congestive heart failure) ([place]) 02/07/2018   Past Medical History:  Diagnosis Date  . Alcohol abuse 02/07/2018  . Atrial fibrillation ([place])   . CHF (congestive heart failure) ([place]) 02/07/2018  . Dementia ([place]) 02/07/2018  . GERD (gastroesophageal reflux disease)   . High cholesterol   . Hypertension   . Lung cancer ([place])   . Psychosis ([place]) 04/08/2018  . Seizures ([place]) 02/07/2018  . Thyroid disease   . TIA (transient ischemic attack)     Family History  Problem Relation Age of Onset  . Heart attack Father        40s    Past Surgical History:  Procedure Laterality Date  . LOBECTOMY    . REPLACEMENT TOTAL KNEE Bilateral   . TOTAL HIP ARTHROPLASTY Left 10/05/2018   Procedure: HEMI HIP ARTHROPLASTY ANTERIOR APPROACH;  Surgeon: Leandrew Koyanagi, MD;  Location: [facility];  Service: Orthopedics;  Laterality: Left;   Social History   Occupational History  . Not on file  Tobacco Use  . Smoking status: Never Smoker  . Smokeless tobacco: Never Used  Substance and Sexual Activity  . Alcohol use: Not Currently  . Drug use: No  . Sexual activity: Not Currently

## 2019-01-10 ENCOUNTER — Non-Acute Institutional Stay (SKILLED_NURSING_FACILITY): Payer: Medicare HMO | Admitting: Internal Medicine

## 2019-01-10 ENCOUNTER — Encounter: Payer: Self-pay | Admitting: Internal Medicine

## 2019-01-10 DIAGNOSIS — F0390 Unspecified dementia without behavioral disturbance: Secondary | ICD-10-CM

## 2019-01-10 DIAGNOSIS — E039 Hypothyroidism, unspecified: Secondary | ICD-10-CM

## 2019-01-10 DIAGNOSIS — I509 Heart failure, unspecified: Secondary | ICD-10-CM

## 2019-01-10 DIAGNOSIS — F03C0 Unspecified dementia, severe, without behavioral disturbance, psychotic disturbance, mood disturbance, and anxiety: Secondary | ICD-10-CM

## 2019-01-10 NOTE — Progress Notes (Signed)
Location:  [facility] Room Number: 140-W Place of Service:  SNF (31)  Hennie Duos, MD  Patient Care Team: Hennie Duos, MD as PCP - General (Internal Medicine) Nyoka Cowden Phylis Bougie, NP as Nurse Practitioner ([place]) [facility], [place] ([place])  Extended Emergency Contact Information Primary Emergency Contact: KELLY,WENDY Address: [address], [place] [address] [place] Phone: [phone] Relation: Daughter Secondary Emergency Contact: Quentin Ore Address: [address] [place] [place] [address] [place] [employer] Phone: [phone] Relation: Daughter    Allergies: Levofloxacin  Chief Complaint  Patient presents with  . Medical Management of Chronic Issues    Routine [place] visit    HPI: Patient is a 85 y.o. female who is being seen for routine issues congestive heart failure, hypothyroidism, dementia with behaviors.  Past Medical History:  Diagnosis Date  . Alcohol abuse 02/07/2018  . Atrial fibrillation ([place])   . CHF (congestive heart failure) ([place]) 02/07/2018  . Dementia ([place]) 02/07/2018  . GERD (gastroesophageal reflux disease)   . High cholesterol   . Hypertension   . Lung cancer ([place])   . Psychosis ([place]) 04/08/2018  . Seizures ([place]) 02/07/2018  . Thyroid disease   . TIA (transient ischemic attack)     Past Surgical History:  Procedure Laterality Date  . LOBECTOMY    . REPLACEMENT TOTAL KNEE Bilateral   . TOTAL HIP ARTHROPLASTY Left 10/05/2018   Procedure: HEMI HIP ARTHROPLASTY ANTERIOR APPROACH;  Surgeon: Leandrew Koyanagi, MD;  Location: [facility];  Service: Orthopedics;  Laterality: Left;    Allergies as of 01/10/2019      Reactions   Levofloxacin    unknown      Medication List    Notice   This visit is during an admission. Changes to the med list made in this visit will be reflected in the After Visit  Summary of the admission.    Current Outpatient Medications on File Prior to Visit  Medication Sig Dispense Refill  . ALPRAZolam (XANAX) 0.25 MG tablet Take 1 tablet (0.25 mg total) by mouth at bedtime. 30 tablet 0  . Amino Acids-Protein Hydrolys (FEEDING SUPPLEMENT, PRO-STAT SUGAR FREE 64,) LIQD Take 30 mLs by mouth 2 (two) times daily between meals.    . busPIRone (BUSPAR) 5 MG tablet Take 5 mg by mouth 2 (two) times daily.    Marland Kitchen diltiazem (TIAZAC) 240 MG 24 hr capsule Take 240 mg by mouth daily.    Marland Kitchen levothyroxine (SYNTHROID, LEVOTHROID) 50 MCG tablet Take 50 mcg by mouth daily before breakfast.    . NON FORMULARY Diet Type Change: change diet to puree    . polyethylene glycol (MIRALAX / GLYCOLAX) 17 g packet Take 17 g by mouth daily as needed for mild constipation. 14 each 0  . QUEtiapine Fumarate (SEROQUEL PO) Take 12.5 mg by mouth 2 (two) times daily.     . traMADol (ULTRAM) 50 MG tablet Take 1 tablet (50 mg total) by mouth every 8 (eight) hours. 90 tablet 0   No current facility-administered medications on file prior to visit.      No orders of the defined types were placed in this encounter.   Immunization History  Administered Date(s) Administered  . Influenza-Unspecified 11/27/2018  . Pneumococcal Polysaccharide-23 08/10/2018  .  Pneumococcal-Unspecified 11/14/2018  . Td 11/14/2018    Social History   Tobacco Use  . Smoking status: Never Smoker  . Smokeless tobacco: Never Used  Substance Use Topics  . Alcohol use: Not Currently    Review of Systems   unable to obtain secondary to dementia; nursing-no acute concerns      Vitals:   01/10/19 1531  BP: 120/64  Pulse: 60  Resp: 20  Temp: (!) 97.3 F (36.3 C)   Body mass index is 16.42 kg/m. Physical Exam  GENERAL APPEARANCE: Alert, non-conversant, No acute distress  SKIN: No diaphoresis rash HEENT: Unremarkable RESPIRATORY: Breathing is even, unlabored. Lung sounds are clear   CARDIOVASCULAR: Heart RRR  no murmurs, rubs or gallops. No peripheral edema  GASTROINTESTINAL: Abdomen is soft, non-tender, not distended w/ normal bowel sounds.  GENITOURINARY: Bladder non tender, not distended; Foley catheter MUSCULOSKELETAL: No abnormal joints or musculature NEUROLOGIC: Cranial nerves 2-12 grossly intact. Moves all extremities PSYCHIATRIC: Dementia, no behavioral issues  Patient Active Problem List   Diagnosis Date Noted  . Unstageable pressure ulcer of sacral region ([place]) 11/11/2018  . Closed left humeral fracture 10/14/2018  . Goals of care, counseling/discussion   . Palliative care by specialist   . Left displaced femoral neck fracture ([place]) 10/03/2018  . Left ankle sprain 06/02/2018  . Chronic anxiety 05/11/2018  . Acquired hypothyroidism 04/25/2018  . 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 Atrial fibrillation with RVR ([place]) 02/07/2018  . Advanced dementia ([place]) 02/07/2018  . Seizures ([place]) 02/07/2018  . Alcohol abuse 02/07/2018  . CHF (congestive heart failure) ([place]) 02/07/2018    CMP     Component Value Date/Time   NA 142 10/07/2018 0347   K 3.9 10/07/2018 0347   CL 112 (H) 10/07/2018 0347   CO2 24 10/07/2018 0347   GLUCOSE 103 (H) 10/07/2018 0347   BUN 12 10/07/2018 0347   CREATININE 0.70 10/07/2018 0347   CALCIUM 8.4 (L) 10/07/2018 0347   PROT 5.6 (L) 07/11/2018 0600   ALBUMIN 3.2 (L) 07/11/2018 0600   AST 18 07/11/2018 0600   ALT 13 07/11/2018 0600   ALKPHOS 90 07/11/2018 0600   BILITOT 0.7 07/11/2018 0600   GFRNONAA >60 10/07/2018 0347   GFRAA >60 10/07/2018 0347   Recent Labs    10/05/18 0513 10/05/18 2057 10/06/18 0412 10/07/18 0347  NA 140  --  140 142  K 4.0  --  3.8 3.9  CL 108  --  106 112*  CO2 22  --  24 24  GLUCOSE 151*  --  134* 103*  BUN 20  --  15 12  CREATININE 0.69 0.87 0.66 0.70  CALCIUM 9.4  --  8.8* 8.4*   Recent Labs    03/17/18 0759 07/11/18 0600  AST 20 18  ALT 14 13  ALKPHOS 81 90  BILITOT 0.9 0.7   PROT 5.7* 5.6*  ALBUMIN 3.1* 3.2*   Recent Labs    03/17/18 0759  10/03/18 1758  10/07/18 0347 10/08/18 0339 10/09/18 0613  WBC 5.7   < > 8.4   < > 8.7 6.2 7.8  NEUTROABS 3.8  --  4.9  --   --   --  5.7  HGB 12.4   < > 15.6*   < > 13.0 10.9* 13.2  HCT 38.8   < > 48.0*   < > 39.2 32.8* 39.0  MCV 108.1*   < > 99.2   < > 100.3* 100.3* 98.7  PLT  224   < > 160   < > 105* 96* 135*   < > = values in this interval not displayed.   No results for input(s): CHOL, LDLCALC, TRIG in the last 8760 hours.  Invalid input(s): HCL No results found for: MICROALBUR Lab Results  Component Value Date   TSH 6.297 (H) 08/01/2018   Lab Results  Component Value Date   HGBA1C 4.8 03/17/2018   No results found for: CHOL, HDL, LDLCALC, LDLDIRECT, TRIG, CHOLHDL  Significant Diagnostic Results in last 30 days:  Xr Hip Unilat W Or W/o Pelvis 2-3 Views Left  Result Date: 12/26/2018 Stable left total partial hip replacement without complication.   Assessment and Plan  CHF (congestive heart failure) (HCC) No recent known exacerbations; stable on no meds; will monitor  Acquired hypothyroidism TSH 6.2, but T4 1.13; continue levothyroxine 50 mcg daily  Advanced dementia ([place]) Stable on no dementia medications; continue Seroquel 12.5 mg twice daily    Hennie Duos, MD

## 2019-01-12 ENCOUNTER — Non-Acute Institutional Stay: Payer: Self-pay | Admitting: Adult Health

## 2019-01-12 ENCOUNTER — Encounter: Payer: Self-pay | Admitting: Adult Health

## 2019-01-12 DIAGNOSIS — F0391 Unspecified dementia with behavioral disturbance: Secondary | ICD-10-CM

## 2019-01-12 DIAGNOSIS — F03C0 Unspecified dementia, severe, without behavioral disturbance, psychotic disturbance, mood disturbance, and anxiety: Secondary | ICD-10-CM

## 2019-01-12 DIAGNOSIS — F0390 Unspecified dementia without behavioral disturbance: Secondary | ICD-10-CM

## 2019-01-12 DIAGNOSIS — Z0000 Encounter for general adult medical examination without abnormal findings: Secondary | ICD-10-CM

## 2019-01-12 DIAGNOSIS — S72002A Fracture of unspecified part of neck of left femur, initial encounter for closed fracture: Secondary | ICD-10-CM

## 2019-01-12 DIAGNOSIS — F03918 Unspecified dementia, unspecified severity, with other behavioral disturbance: Secondary | ICD-10-CM

## 2019-01-12 NOTE — Progress Notes (Signed)
Location:  [facility] Room Number: 140/W Place of Service:  SNF (31) Provider: Ok Edwards NP   Patient Care Team: Hennie Duos, MD as PCP - General (Internal Medicine) Nyoka Cowden Phylis Bougie, NP as Nurse Practitioner ([place]) [facility], [place] ([place])  Extended Emergency Contact Information Primary Emergency Contact: KELLY,WENDY Address: [address], [place] [address] [place] Phone: [phone] Relation: Daughter Secondary Emergency Contact: Quentin Ore Address: [address] [place] [address] [place] [employer] [place] Phone: [phone] Relation: Daughter  Code Status: dnr Goals of Care: Advanced Directive information Advanced Directives 01/12/2019  Does Patient Have a Medical Advance Directive? Yes  Type of Advance Directive Out of facility DNR (pink MOST or yellow form)  Does patient want to make changes to medical advance directive? No - Patient declined  Would patient like information on creating a medical advance directive? -  Pre-existing out of facility DNR order (yellow form or pink MOST form) -     Chief Complaint  Patient presents with  . Medicare Wellness    Annual Wellness Visit    HPI: Patient is a 85 y.o. female seen in today for an annual wellness exam.  She has been hospitalized one time this past year due to her fall and left femoral neck fracture. Her weight has remained relatively stable. Her pain is presently being managed; there are no reports of agitation present. She continues to be followed for her chronic illnesses including: dementia; left femoral neck fracture psychosis.     Fall Risk  01/12/2019  Falls in the past year? 1  Number falls in past yr: 1  Injury with Fall? 1  Risk for fall due to : History of fall(s);Impaired mobility  Follow up Falls evaluation completed      Health Maintenance   Topic Date Due  . PNA vac Low Risk Adult (2 of 2 - PCV13) 11/14/2019  . TETANUS/TDAP  11/13/2028  . INFLUENZA VACCINE  Completed    Functional Status Survey: Is the patient deaf or have difficulty hearing?: Yes Does the patient have difficulty seeing, even when wearing glasses/contacts?: Yes Does the patient have difficulty concentrating, remembering, or making decisions?: Yes Does the patient have difficulty walking or climbing stairs?: No(wheelchair bound) Does the patient have difficulty dressing or bathing?: Yes   Past Medical History:  Diagnosis Date  . Alcohol abuse 02/07/2018  . Atrial fibrillation ([place])   . CHF (congestive heart failure) ([place]) 02/07/2018  . Dementia ([place]) 02/07/2018  . GERD (gastroesophageal reflux disease)   . High cholesterol   . Hypertension   . Lung cancer ([place])   . Psychosis ([place]) 04/08/2018  . Seizures ([place]) 02/07/2018  . Thyroid disease   . TIA (transient ischemic attack)     Past Surgical History:  Procedure Laterality Date  . LOBECTOMY    . REPLACEMENT TOTAL KNEE Bilateral   . TOTAL HIP ARTHROPLASTY Left 10/05/2018   Procedure: HEMI HIP ARTHROPLASTY ANTERIOR APPROACH;  Surgeon: Leandrew Koyanagi, MD;  Location: [facility];  Service: Orthopedics;  Laterality: Left;    Family History  Problem Relation Age of Onset  . Heart attack Father        78s    Social History   Socioeconomic History  . Marital status:  Widowed    Spouse name: Not on file  . Number of children: Not on file  . Years of education: Not on file  . Highest education level: Not on file  Occupational History  . Occupation: retired   Scientific laboratory technician  . Financial resource strain: Not on file  . Food insecurity    Worry: Not on file    Inability: Not on file  . Transportation needs    Medical: Not on file    Non-medical: Not on file  Tobacco Use  . Smoking status: Never Smoker  . Smokeless tobacco: Never Used  Substance and Sexual Activity  . Alcohol use: Not Currently   . Drug use: No  . Sexual activity: Not Currently  Lifestyle  . Physical activity    Days per week: Not on file    Minutes per session: Not on file  . Stress: Not on file  Relationships  . Social Herbalist on phone: Not on file    Gets together: Not on file    Attends religious service: Not on file    Active member of club or organization: Not on file    Attends meetings of clubs or organizations: Not on file    Relationship status: Not on file  Other Topics Concern  . Not on file  Social History Narrative   Long term resident of [facility]   Unable to participate in questions     reports that she has never smoked. She has never used smokeless tobacco. She reports previous alcohol use. She reports that she does not use drugs.   Allergies  Allergen Reactions  . Levofloxacin     unknown    Outpatient Encounter Medications as of 01/12/2019  Medication Sig  . ALPRAZolam (XANAX) 0.25 MG tablet Take 1 tablet (0.25 mg total) by mouth at bedtime.  . Amino Acids-Protein Hydrolys (FEEDING SUPPLEMENT, PRO-STAT SUGAR FREE 64,) LIQD Take 30 mLs by mouth 2 (two) times daily between meals.  Roseanne Kaufman [place] [facility]) OINT Special Instructions: Apply to sacrum, coccyx and bilateral buttocks qshift & prn. Every Shift Day, Evening, Night  . busPIRone (BUSPAR) 5 MG tablet Take 5 mg by mouth 2 (two) times daily.  Marland Kitchen diltiazem (TIAZAC) 240 MG 24 hr capsule Take 240 mg by mouth daily.  Marland Kitchen levothyroxine (SYNTHROID, LEVOTHROID) 50 MCG tablet Take 50 mcg by mouth daily before breakfast.  . NON FORMULARY Diet Type Change: change diet to puree  . NON FORMULARY Magic cup daily- for increased nutrition intake Once A Day 02:00 PM  . polyethylene glycol (MIRALAX / GLYCOLAX) 17 g packet Take 17 g by mouth daily as needed for mild constipation.  . QUEtiapine Fumarate (SEROQUEL PO) Take 12.5 mg by mouth 2 (two) times daily.   . traMADol (ULTRAM) 50 MG tablet Take 1 tablet (50 mg total) by mouth  every 8 (eight) hours.   No facility-administered encounter medications on file as of 01/12/2019.      Review of Systems:  Review of Systems  Unable to perform ROS: Dementia (unable to participate )    Physical Exam: Vitals:   01/12/19 0911  BP: 120/64  Pulse: 60  Resp: 20  Temp: (!) 97.3 F (36.3 C)  TempSrc: Oral  Weight: 89 lb 12.8 oz (40.7 kg)  Height: 5\' 2"  (1.575 m)   Body mass index is 16.42 kg/m. Physical Exam Constitutional:      General: She is not in acute distress.    Appearance:  She is underweight. She is not diaphoretic.  Neck:     Musculoskeletal: Neck supple.     Thyroid: No thyromegaly.  Cardiovascular:     Rate and Rhythm: Normal rate and regular rhythm.     Pulses: Normal pulses.     Heart sounds: Normal heart sounds.     Comments: History cardiac stent Pulmonary:     Effort: Pulmonary effort is normal. No respiratory distress.     Breath sounds: Normal breath sounds.  Abdominal:     General: Bowel sounds are normal. There is no distension.     Palpations: Abdomen is soft.     Tenderness: There is no abdominal tenderness.  Genitourinary:    Comments: foley Musculoskeletal:     Right lower leg: No edema.     Left lower leg: No edema.     Comments: Is able to move all extremities  Is status post left hip hemi arthroplasty 10-05-18 Is status post left humeral fracture History of bilateral knee replacements   Lymphadenopathy:     Cervical: No cervical adenopathy.  Skin:    General: Skin is warm and dry.  Neurological:     Mental Status: She is alert. Mental status is at baseline.  Psychiatric:        Mood and Affect: Mood normal.       PREVIOUS    10-03-18: left hip and pelvis x-ray: Mildly comminuted, foreshortened and slightly valgus angulated transcervical left femoral neck fracture. Remote appearing posttraumatic deformity of the left inferior and superior pubic ramus  10-03-18: chest x-ray:  1. No active disease.  Likely chronic  coarse interstitial changes. 2. Question projectional irregularity versus fracture of the left humerus. Correlate with point tenderness and consider dedicated shoulder radiographs if there is clinical concern  10-03-18: left humerus x-ray: Age indeterminate fracture of the left humeral neck. Clinical correlation is recommended.  10-05-18: pelvic x-ray:  1. Interval left hip replacement with expected surgical change 2. Chronic deformities of the left superior and inferior pubic rami  NO NEW EXAMS.    LABS REVIEWED PREVIOUS;   03-17-18: wbc 5.7; hgb 12.4; hct 38.8; mcv 108.1; plt 224; glucose 85; bun 13; creat 0.91; k+ 3.3; na++ 138; ca 8.7 protein 5.7 albumin 3.1 hgb 4.8 03-20-18: glucose 74; bun 14; creat 0.81; k+ 3.5; na++ 137; ca 9.2 04-19-28: tsh 93.852 05-18-18: tsh 31.786 06-29-18: vit B 12: 308; tsh 14.789 07-11-18: wbc 4.7; hgb 12.9; hct 39.1; mcv 104.5; plt 146 ; glucose 89; bun 24; creat 0.74; k+ 3.9; na++ 141; ca 9.0 liver normal albumin 3.2 tsh 13.945 free T4: 0.78 08-01-18: tsh 6.297 free T3; 2.2 free T4: 1.13 10-03-18: wbc 8.4; hgb 15.6; hct 48.0; mcv 99.2 plt 160; glucose 124; bun 30; creat 0.81; k+ 4.3; na++ 142; ca 9.6 10-06-18: wbc 9.9; hgb 15.3; hct 46.1; mcv 99.1; plt 121; glucose 134; bun 15; creat 0.66; k+ 3.8; na++ 140; ca 8.8 10-09-18: wbc 7.8; hgb 13.2; hct 39.0; mcv 98.7 plt 135  NO NEW LABS.     Assessment/Plan  TODAY;   1. Advanced dementia 2. Left displaced femoral neck fracture 3. Psychosis in elderly with behavioral disturbance 4. Medicare annual wellness visit subsequent  Will continue current medications Will continue current plan of care Will continue to monitor her status.    Ok Edwards NP [facility]  Contact [phone] Monday through Friday 8am- 5pm  After hours call [phone]

## 2019-01-13 ENCOUNTER — Encounter: Payer: Self-pay | Admitting: Internal Medicine

## 2019-01-13 NOTE — Assessment & Plan Note (Signed)
TSH 6.2, but T4 1.13; continue levothyroxine 50 mcg daily

## 2019-01-13 NOTE — Assessment & Plan Note (Signed)
Stable on no dementia medications; continue Seroquel 12.5 mg twice daily

## 2019-01-13 NOTE — Assessment & Plan Note (Signed)
No recent known exacerbations; stable on no meds; will monitor

## 2019-01-16 ENCOUNTER — Encounter: Payer: Self-pay | Admitting: Adult Health

## 2019-01-16 ENCOUNTER — Non-Acute Institutional Stay (SKILLED_NURSING_FACILITY): Payer: Medicare HMO | Admitting: Adult Health

## 2019-01-16 DIAGNOSIS — I4891 Unspecified atrial fibrillation: Secondary | ICD-10-CM | POA: Diagnosis not present

## 2019-01-16 DIAGNOSIS — F0390 Unspecified dementia without behavioral disturbance: Secondary | ICD-10-CM | POA: Diagnosis not present

## 2019-01-16 DIAGNOSIS — F03C0 Unspecified dementia, severe, without behavioral disturbance, psychotic disturbance, mood disturbance, and anxiety: Secondary | ICD-10-CM

## 2019-01-16 DIAGNOSIS — F0391 Unspecified dementia with behavioral disturbance: Secondary | ICD-10-CM

## 2019-01-16 DIAGNOSIS — F03918 Unspecified dementia, unspecified severity, with other behavioral disturbance: Secondary | ICD-10-CM

## 2019-01-16 NOTE — Progress Notes (Signed)
Location:    [facility] Room Number: 140/W Place of Service:  SNF (31)   CODE STATUS: DNR  Allergies  Allergen Reactions  . Levofloxacin     unknown    Chief Complaint  Patient presents with  . Acute Visit    Care Plan Meeting    HPI:  We have come together for her care plan meeting. Unable to perform BIMS or mood. She is is persistently losing weight. The focus of her care is comfort. There are no reports of further falls. There are no reports of agitation; no reports of insomnia or uncontrolled pain. She continues to be followed for her chronic illnesses including: psychosis; dementia; afib.   Past Medical History:  Diagnosis Date  . Alcohol abuse 02/07/2018  . Atrial fibrillation ([place])   . CHF (congestive heart failure) ([place]) 02/07/2018  . Dementia ([place]) 02/07/2018  . GERD (gastroesophageal reflux disease)   . High cholesterol   . Hypertension   . Lung cancer ([place])   . Psychosis ([place]) 04/08/2018  . Seizures ([place]) 02/07/2018  . Thyroid disease   . TIA (transient ischemic attack)     Past Surgical History:  Procedure Laterality Date  . LOBECTOMY    . REPLACEMENT TOTAL KNEE Bilateral   . TOTAL HIP ARTHROPLASTY Left 10/05/2018   Procedure: HEMI HIP ARTHROPLASTY ANTERIOR APPROACH;  Surgeon: Leandrew Koyanagi, MD;  Location: [facility];  Service: Orthopedics;  Laterality: Left;    Social History   Socioeconomic History  . Marital status: Widowed    Spouse name: Not on file  . Number of children: Not on file  . Years of education: Not on file  . Highest education level: Not on file  Occupational History  . Occupation: retired   Scientific laboratory technician  . Financial resource strain: Not on file  . Food insecurity    Worry: Not on file    Inability: Not on file  . Transportation needs    Medical: Not on file    Non-medical: Not on file  Tobacco Use  . Smoking status: Never Smoker  . Smokeless tobacco: Never Used  Substance and Sexual Activity  . Alcohol  use: Not Currently  . Drug use: No  . Sexual activity: Not Currently  Lifestyle  . Physical activity    Days per week: Not on file    Minutes per session: Not on file  . Stress: Not on file  Relationships  . Social Herbalist on phone: Not on file    Gets together: Not on file    Attends religious service: Not on file    Active member of club or organization: Not on file    Attends meetings of clubs or organizations: Not on file    Relationship status: Not on file  . Intimate partner violence    Fear of current or ex partner: Not on file    Emotionally abused: Not on file    Physically abused: Not on file    Forced sexual activity: Not on file  Other Topics Concern  . Not on file  Social History Narrative   Long term resident of [facility]   Unable to participate in questions    Family History  Problem Relation Age of Onset  . Heart attack Father        38s      VITAL SIGNS BP 120/64   Pulse 60   Temp 98.1 F (36.7 C) (Oral)   Resp  20   Ht 5\' 2"  (1.575 m)   Wt 89 lb 12.8 oz (40.7 kg)   BMI 16.42 kg/m   Outpatient Encounter Medications as of 01/16/2019  Medication Sig  . ALPRAZolam (XANAX) 0.25 MG tablet Take 1 tablet (0.25 mg total) by mouth at bedtime.  . Amino Acids-Protein Hydrolys (FEEDING SUPPLEMENT, PRO-STAT SUGAR FREE 64,) LIQD Take 30 mLs by mouth 2 (two) times daily between meals.  Roseanne Kaufman [place] [facility]) OINT Special Instructions: Apply to sacrum, coccyx and bilateral buttocks qshift & prn. Every Shift Day, Evening, Night  . busPIRone (BUSPAR) 5 MG tablet Take 5 mg by mouth 2 (two) times daily.  Marland Kitchen diltiazem (TIAZAC) 240 MG 24 hr capsule Take 240 mg by mouth daily.  Marland Kitchen levothyroxine (SYNTHROID, LEVOTHROID) 50 MCG tablet Take 50 mcg by mouth daily before breakfast.  . NON FORMULARY Diet Change: Dysphagia 1 (puree), thin liquids  . NON FORMULARY Magic cup daily- for increased nutrition intake Once A Day 02:00 PM  . polyethylene glycol  (MIRALAX / GLYCOLAX) 17 g packet Take 17 g by mouth daily as needed for mild constipation.  . QUEtiapine Fumarate (SEROQUEL PO) Take 12.5 mg by mouth 2 (two) times daily.   . traMADol (ULTRAM) 50 MG tablet Take 1 tablet (50 mg total) by mouth every 8 (eight) hours.   No facility-administered encounter medications on file as of 01/16/2019.      SIGNIFICANT DIAGNOSTIC EXAMS  PREVIOUS    10-03-18: left hip and pelvis x-ray: Mildly comminuted, foreshortened and slightly valgus angulated transcervical left femoral neck fracture. Remote appearing posttraumatic deformity of the left inferior and superior pubic ramus  10-03-18: chest x-ray:  1. No active disease.  Likely chronic coarse interstitial changes. 2. Question projectional irregularity versus fracture of the left humerus. Correlate with point tenderness and consider dedicated shoulder radiographs if there is clinical concern  10-03-18: left humerus x-ray: Age indeterminate fracture of the left humeral neck. Clinical correlation is recommended.  10-05-18: pelvic x-ray:  1. Interval left hip replacement with expected surgical change 2. Chronic deformities of the left superior and inferior pubic rami  NO NEW EXAMS.    LABS REVIEWED PREVIOUS;   03-17-18: wbc 5.7; hgb 12.4; hct 38.8; mcv 108.1; plt 224; glucose 85; bun 13; creat 0.91; k+ 3.3; na++ 138; ca 8.7 protein 5.7 albumin 3.1 hgb 4.8 03-20-18: glucose 74; bun 14; creat 0.81; k+ 3.5; na++ 137; ca 9.2 04-19-28: tsh 93.852 05-18-18: tsh 31.786 06-29-18: vit B 12: 308; tsh 14.789 07-11-18: wbc 4.7; hgb 12.9; hct 39.1; mcv 104.5; plt 146 ; glucose 89; bun 24; creat 0.74; k+ 3.9; na++ 141; ca 9.0 liver normal albumin 3.2 tsh 13.945 free T4: 0.78 08-01-18: tsh 6.297 free T3; 2.2 free T4: 1.13 10-03-18: wbc 8.4; hgb 15.6; hct 48.0; mcv 99.2 plt 160; glucose 124; bun 30; creat 0.81; k+ 4.3; na++ 142; ca 9.6 10-06-18: wbc 9.9; hgb 15.3; hct 46.1; mcv 99.1; plt 121; glucose 134; bun 15; creat 0.66;  k+ 3.8; na++ 140; ca 8.8 10-09-18: wbc 7.8; hgb 13.2; hct 39.0; mcv 98.7 plt 135  NO NEW LABS.   Review of Systems  Unable to perform ROS: Dementia (unable to participate )    Physical Exam Constitutional:      General: She is not in acute distress.    Appearance: She is underweight. She is not diaphoretic.  Neck:     Musculoskeletal: Neck supple.     Thyroid: No thyromegaly.  Cardiovascular:  Rate and Rhythm: Normal rate and regular rhythm.     Pulses: Normal pulses.     Heart sounds: Normal heart sounds.     Comments: History of cardiac stent Pulmonary:     Effort: Pulmonary effort is normal. No respiratory distress.     Breath sounds: Normal breath sounds.  Abdominal:     General: Bowel sounds are normal. There is no distension.     Palpations: Abdomen is soft.     Tenderness: There is no abdominal tenderness.  Genitourinary:    Comments: Foley  Musculoskeletal:     Right lower leg: No edema.     Left lower leg: No edema.     Comments: Is able to move all extremities  Is status post left hip hemi arthroplasty 10-05-18 Is status post left humeral fracture History of bilateral knee replacements    Lymphadenopathy:     Cervical: No cervical adenopathy.  Skin:    General: Skin is warm and dry.  Neurological:     Mental Status: She is alert. Mental status is at baseline.  Psychiatric:        Mood and Affect: Mood normal.        ASSESSMENT/ PLAN:  TODAY;   1. atrial fibrillation with RVR 2. Advanced dementia 3. Psychosis in elderly with behavioral disturbance.   She is continuing to lose wight The focus of her care is comfort Will continnue current medications Will continue current plan of care Will monitor her status.   MD is aware of resident's narcotic use and is in agreement with current plan of care. We will attempt to wean resident as appropriate.  Ok Edwards NP [facility]  Contact [phone] Monday through Friday 8am- 5pm   After hours call [phone]

## 2019-01-17 ENCOUNTER — Other Ambulatory Visit: Payer: Self-pay | Admitting: Adult Health

## 2019-01-17 MED ORDER — TRAMADOL HCL 50 MG PO TABS: 50.0000 mg | ORAL_TABLET | Freq: Three times a day (TID) | ORAL | 0 refills | Status: DC

## 2019-01-23 DIAGNOSIS — Z0000 Encounter for general adult medical examination without abnormal findings: Secondary | ICD-10-CM | POA: Insufficient documentation

## 2019-01-25 ENCOUNTER — Encounter: Payer: Self-pay | Admitting: Adult Health

## 2019-01-25 ENCOUNTER — Non-Acute Institutional Stay (SKILLED_NURSING_FACILITY): Payer: Medicare HMO | Admitting: Adult Health

## 2019-01-25 DIAGNOSIS — F0390 Unspecified dementia without behavioral disturbance: Secondary | ICD-10-CM | POA: Diagnosis not present

## 2019-01-25 DIAGNOSIS — F03C0 Unspecified dementia, severe, without behavioral disturbance, psychotic disturbance, mood disturbance, and anxiety: Secondary | ICD-10-CM

## 2019-01-25 NOTE — Progress Notes (Signed)
Location:    [facility] Room Number: 140/W Place of Service:  SNF (31)   CODE STATUS: DNR  Allergies  Allergen Reactions   Levofloxacin     unknown    Chief Complaint  Patient presents with   Acute Visit    Pica    HPI:  Staff report that for past several days she has been found eating dirt out of a flower pot. She is eating her meals. There are no reports of changes in her appetite. No reports of agitation.   Past Medical History:  Diagnosis Date   Alcohol abuse 02/07/2018   Atrial fibrillation (HCC)    CHF (congestive heart failure) ([place]) 02/07/2018   Dementia ([place]) 02/07/2018   GERD (gastroesophageal reflux disease)    High cholesterol    Hypertension    Lung cancer ([place])    Psychosis ([place]) 04/08/2018   Seizures ([place]) 02/07/2018   Thyroid disease    TIA (transient ischemic attack)     Past Surgical History:  Procedure Laterality Date   LOBECTOMY     REPLACEMENT TOTAL KNEE Bilateral    TOTAL HIP ARTHROPLASTY Left 10/05/2018   Procedure: HEMI HIP ARTHROPLASTY ANTERIOR APPROACH;  Surgeon: Leandrew Koyanagi, MD;  Location: [facility];  Service: Orthopedics;  Laterality: Left;    Social History   Socioeconomic History   Marital status: Widowed    Spouse name: Not on file   Number of children: Not on file   Years of education: Not on file   Highest education level: Not on file  Occupational History   Occupation: retired   Scientist, product/process development strain: Not on file   Food insecurity    Worry: Not on file    Inability: Not on Lexicographer needs    Medical: Not on file    Non-medical: Not on file  Tobacco Use   Smoking status: Never Smoker   Smokeless tobacco: Never Used  Substance and Sexual Activity   Alcohol use: Not Currently   Drug use: No   Sexual activity: Not Currently  Lifestyle   Physical activity    Days per week: Not on file    Minutes per session: Not on file   Stress:  Not on file  Relationships   Social connections    Talks on phone: Not on file    Gets together: Not on file    Attends religious service: Not on file    Active member of club or organization: Not on file    Attends meetings of clubs or organizations: Not on file    Relationship status: Not on file   Intimate partner violence    Fear of current or ex partner: Not on file    Emotionally abused: Not on file    Physically abused: Not on file    Forced sexual activity: Not on file  Other Topics Concern   Not on file  Social History Narrative   Long term resident of [facility]   Unable to participate in questions    Family History  Problem Relation Age of Onset   Heart attack Father        58s      VITAL SIGNS BP 120/64    Pulse 60    Temp (!) 97.3 F (36.3 C) (Oral)    Resp 20    Ht 5\' 2"  (1.575 m)    Wt 89 lb 12.8 oz (40.7 kg)    BMI  16.42 kg/m   Outpatient Encounter Medications as of 01/25/2019  Medication Sig   ALPRAZolam (XANAX) 0.25 MG tablet Take 1 tablet (0.25 mg total) by mouth at bedtime.   Amino Acids-Protein Hydrolys (FEEDING SUPPLEMENT, PRO-STAT SUGAR FREE 64,) LIQD Take 30 mLs by mouth 2 (two) times daily between meals.   Balsam [place]-Castor Oil [facility]) OINT Special Instructions: Apply to sacrum, coccyx and bilateral buttocks qshift & prn. Every Shift Day, Evening, Night   busPIRone (BUSPAR) 5 MG tablet Take 5 mg by mouth 2 (two) times daily.   diltiazem (TIAZAC) 240 MG 24 hr capsule Take 240 mg by mouth daily.   Eyelid Cleansers (OCUSOFT EYELID CLEANSING) PADS cleanse both eyes BID Twice A Day 09:00 AM, 09:00 PM   levothyroxine (SYNTHROID, LEVOTHROID) 50 MCG tablet Take 50 mcg by mouth daily before breakfast.   NON FORMULARY Diet Change: Dysphagia 1 (puree), thin liquids   NON FORMULARY Magic cup daily- for increased nutrition intake Once A Day 02:00 PM   polyethylene glycol (MIRALAX / GLYCOLAX) 17 g packet Take 17 g by mouth daily as needed for mild  constipation.   QUEtiapine Fumarate (SEROQUEL PO) Take 12.5 mg by mouth 2 (two) times daily.    traMADol (ULTRAM) 50 MG tablet Take 1 tablet (50 mg total) by mouth every 8 (eight) hours.   No facility-administered encounter medications on file as of 01/25/2019.      SIGNIFICANT DIAGNOSTIC EXAMS  PREVIOUS    10-03-18: left hip and pelvis x-ray: Mildly comminuted, foreshortened and slightly valgus angulated transcervical left femoral neck fracture. Remote appearing posttraumatic deformity of the left inferior and superior pubic ramus  10-03-18: chest x-ray:  1. No active disease.  Likely chronic coarse interstitial changes. 2. Question projectional irregularity versus fracture of the left humerus. Correlate with point tenderness and consider dedicated shoulder radiographs if there is clinical concern  10-03-18: left humerus x-ray: Age indeterminate fracture of the left humeral neck. Clinical correlation is recommended.  10-05-18: pelvic x-ray:  1. Interval left hip replacement with expected surgical change 2. Chronic deformities of the left superior and inferior pubic rami  NO NEW EXAMS.    LABS REVIEWED PREVIOUS;   03-17-18: wbc 5.7; hgb 12.4; hct 38.8; mcv 108.1; plt 224; glucose 85; bun 13; creat 0.91; k+ 3.3; na++ 138; ca 8.7 protein 5.7 albumin 3.1 hgb 4.8 03-20-18: glucose 74; bun 14; creat 0.81; k+ 3.5; na++ 137; ca 9.2 04-19-28: tsh 93.852 05-18-18: tsh 31.786 06-29-18: vit B 12: 308; tsh 14.789 07-11-18: wbc 4.7; hgb 12.9; hct 39.1; mcv 104.5; plt 146 ; glucose 89; bun 24; creat 0.74; k+ 3.9; na++ 141; ca 9.0 liver normal albumin 3.2 tsh 13.945 free T4: 0.78 08-01-18: tsh 6.297 free T3; 2.2 free T4: 1.13 10-03-18: wbc 8.4; hgb 15.6; hct 48.0; mcv 99.2 plt 160; glucose 124; bun 30; creat 0.81; k+ 4.3; na++ 142; ca 9.6 10-06-18: wbc 9.9; hgb 15.3; hct 46.1; mcv 99.1; plt 121; glucose 134; bun 15; creat 0.66; k+ 3.8; na++ 140; ca 8.8 10-09-18: wbc 7.8; hgb 13.2; hct 39.0; mcv 98.7 plt  135  NO NEW LABS.   Review of Systems  Unable to perform ROS: Dementia (unable to participate )    Physical Exam Constitutional:      General: She is not in acute distress.    Appearance: She is well-developed. She is not diaphoretic.  Eyes:     Conjunctiva/sclera: Conjunctivae normal.  Neck:     Musculoskeletal: Neck supple.     Thyroid:  No thyromegaly.  Cardiovascular:     Rate and Rhythm: Normal rate and regular rhythm.     Pulses: Normal pulses.     Heart sounds: Normal heart sounds.     Comments: History of cardiac stent Pulmonary:     Effort: Pulmonary effort is normal. No respiratory distress.     Breath sounds: Normal breath sounds.  Abdominal:     General: Bowel sounds are normal. There is no distension.     Palpations: Abdomen is soft.     Tenderness: There is no abdominal tenderness.  Genitourinary:    Comments: Foley  Musculoskeletal:     Right lower leg: No edema.     Left lower leg: No edema.     Comments: Is able to move all extremities  Is status post left hip hemi arthroplasty 10-05-18 Is status post left humeral fracture History of bilateral knee replacements     Lymphadenopathy:     Cervical: No cervical adenopathy.  Skin:    General: Skin is warm and dry.  Neurological:     Mental Status: She is alert. Mental status is at baseline.  Psychiatric:        Mood and Affect: Mood normal.      ASSESSMENT/ PLAN:  TODAY;   1. Advanced dementia:   This could represent an abnormal behavior related to her dementia or a pica issue. The flower pot has been removed. Will check cbc; iron tibc; vit b 12; folate thiamine and will monitor her status.   MD is aware of resident's narcotic use and is in agreement with current plan of care. We will attempt to wean resident as appropriate.  Ok Edwards NP [facility]  Contact [phone] Monday through Friday 8am- 5pm  After hours call [phone]

## 2019-01-26 ENCOUNTER — Other Ambulatory Visit (HOSPITAL_COMMUNITY)
Admission: RE | Admit: 2019-01-26 | Discharge: 2019-01-26 | Disposition: A | Discharge status: Home / Self Care | Payer: Medicare HMO | Source: Skilled Nursing Facility | Attending: Adult Health | Admitting: Adult Health

## 2019-01-26 DIAGNOSIS — F5089 Other specified eating disorder: Secondary | ICD-10-CM | POA: Insufficient documentation

## 2019-01-26 LAB — CBC
HCT: 42.3 % (ref 36.0–46.0)
Hemoglobin: 13.6 g/dL (ref 12.0–15.0)
MCH: 33.7 pg (ref 26.0–34.0)
MCHC: 32.2 g/dL (ref 30.0–36.0)
MCV: 104.7 fL — ABNORMAL HIGH (ref 80.0–100.0)
Platelets: 145 10*3/uL — ABNORMAL LOW (ref 150–400)
RBC: 4.04 MIL/uL (ref 3.87–5.11)
RDW: 14.2 % (ref 11.5–15.5)
WBC: 5.8 10*3/uL (ref 4.0–10.5)
nRBC: 0 % (ref 0.0–0.2)

## 2019-01-26 LAB — VITAMIN B12: Vitamin B-12: 392 pg/mL (ref 180–914)

## 2019-01-26 LAB — FOLATE: Folate: 6.6 ng/mL (ref >5.9)

## 2019-01-26 LAB — IRON AND TIBC
Iron: 129 ug/dL (ref 28–170)
Saturation Ratios: 34 % — ABNORMAL HIGH (ref 10.4–31.8)
TIBC: 377 ug/dL (ref 250–450)
UIBC: 248 ug/dL

## 2019-01-28 ENCOUNTER — Other Ambulatory Visit: Payer: Self-pay | Admitting: Internal Medicine

## 2019-01-28 ENCOUNTER — Other Ambulatory Visit (HOSPITAL_COMMUNITY)
Admission: RE | Admit: 2019-01-28 | Discharge: 2019-01-28 | Disposition: A | Discharge status: Home / Self Care | Payer: Medicare HMO | Source: Ambulatory Visit | Attending: Internal Medicine | Admitting: Internal Medicine

## 2019-01-28 DIAGNOSIS — Z9189 Other specified personal risk factors, not elsewhere classified: Secondary | ICD-10-CM

## 2019-01-28 DIAGNOSIS — Z20828 Contact with and (suspected) exposure to other viral communicable diseases: Secondary | ICD-10-CM | POA: Insufficient documentation

## 2019-01-28 LAB — VITAMIN B1: Vitamin B1 (Thiamine): 90 nmol/L (ref 66.5–200.0)

## 2019-01-30 ENCOUNTER — Other Ambulatory Visit: Payer: Self-pay | Admitting: Adult Health

## 2019-01-30 MED ORDER — ALPRAZOLAM 0.25 MG PO TABS: 0.2500 mg | ORAL_TABLET | Freq: Every day | ORAL | 0 refills | Status: DC

## 2019-01-31 LAB — SARS CORONAVIRUS 2 (TAT 6-24 HRS): SARS Coronavirus 2: NEGATIVE

## 2019-02-03 ENCOUNTER — Other Ambulatory Visit: Payer: Self-pay | Admitting: Internal Medicine

## 2019-02-03 ENCOUNTER — Other Ambulatory Visit (HOSPITAL_COMMUNITY)
Admission: RE | Admit: 2019-02-03 | Discharge: 2019-02-03 | Disposition: A | Discharge status: Home / Self Care | Payer: Medicare HMO | Source: Ambulatory Visit | Attending: Internal Medicine | Admitting: Internal Medicine

## 2019-02-03 DIAGNOSIS — Z9189 Other specified personal risk factors, not elsewhere classified: Secondary | ICD-10-CM

## 2019-02-03 DIAGNOSIS — Z20828 Contact with and (suspected) exposure to other viral communicable diseases: Secondary | ICD-10-CM | POA: Insufficient documentation

## 2019-02-03 LAB — SARS CORONAVIRUS 2 (TAT 6-24 HRS): SARS Coronavirus 2: NEGATIVE

## 2019-02-07 ENCOUNTER — Other Ambulatory Visit (HOSPITAL_COMMUNITY)
Admission: RE | Admit: 2019-02-07 | Discharge: 2019-02-07 | Disposition: A | Discharge status: Home / Self Care | Payer: Medicare HMO | Source: Ambulatory Visit | Attending: Internal Medicine | Admitting: Internal Medicine

## 2019-02-07 DIAGNOSIS — Z20828 Contact with and (suspected) exposure to other viral communicable diseases: Secondary | ICD-10-CM | POA: Diagnosis present

## 2019-02-08 LAB — NOVEL CORONAVIRUS, NAA (HOSP ORDER, SEND-OUT TO REF LAB; TAT 18-24 HRS): SARS-CoV-2, NAA: NOT DETECTED

## 2019-02-09 ENCOUNTER — Encounter: Payer: Self-pay | Admitting: Adult Health

## 2019-02-09 ENCOUNTER — Non-Acute Institutional Stay (SKILLED_NURSING_FACILITY): Payer: Medicare HMO | Admitting: Adult Health

## 2019-02-09 DIAGNOSIS — I509 Heart failure, unspecified: Secondary | ICD-10-CM

## 2019-02-09 DIAGNOSIS — E039 Hypothyroidism, unspecified: Secondary | ICD-10-CM

## 2019-02-09 DIAGNOSIS — S42215S Unspecified nondisplaced fracture of surgical neck of left humerus, sequela: Secondary | ICD-10-CM

## 2019-02-09 DIAGNOSIS — I4891 Unspecified atrial fibrillation: Secondary | ICD-10-CM | POA: Diagnosis not present

## 2019-02-09 DIAGNOSIS — F0390 Unspecified dementia without behavioral disturbance: Secondary | ICD-10-CM

## 2019-02-09 DIAGNOSIS — F419 Anxiety disorder, unspecified: Secondary | ICD-10-CM

## 2019-02-09 DIAGNOSIS — S72002A Fracture of unspecified part of neck of left femur, initial encounter for closed fracture: Secondary | ICD-10-CM

## 2019-02-09 DIAGNOSIS — F03918 Unspecified dementia, unspecified severity, with other behavioral disturbance: Secondary | ICD-10-CM

## 2019-02-09 DIAGNOSIS — R569 Unspecified convulsions: Secondary | ICD-10-CM

## 2019-02-09 DIAGNOSIS — F0391 Unspecified dementia with behavioral disturbance: Secondary | ICD-10-CM

## 2019-02-09 DIAGNOSIS — F03C0 Unspecified dementia, severe, without behavioral disturbance, psychotic disturbance, mood disturbance, and anxiety: Secondary | ICD-10-CM

## 2019-02-09 NOTE — Progress Notes (Signed)
Location:    [facility] Room Number: 140/W Place of Service:  SNF (31)   CODE STATUS: DNR  Allergies  Allergen Reactions  . Levofloxacin     unknown    Chief Complaint  Patient presents with  . Annual Exam    Annual Exam    HPI:  She is a 85 year old long term resident of this facility being seen for her annual comprehensive exam. She was hospitalized back in August of this year for a hip fracture. Her weight has been fairly stable over the past year. Her pain is presently being managed with her routine ultram. There are no reports of changes in appetite; no reports of agitation. She continues to be followed for her chronic illnesses: afib; chf; dementia.   Past Medical History:  Diagnosis Date  . Alcohol abuse 02/07/2018  . Atrial fibrillation ([place])   . CHF (congestive heart failure) ([place]) 02/07/2018  . Dementia ([place]) 02/07/2018  . GERD (gastroesophageal reflux disease)   . High cholesterol   . Hypertension   . Lung cancer ([place])   . Psychosis ([place]) 04/08/2018  . Seizures ([place]) 02/07/2018  . Thyroid disease   . TIA (transient ischemic attack)     Past Surgical History:  Procedure Laterality Date  . LOBECTOMY    . REPLACEMENT TOTAL KNEE Bilateral   . TOTAL HIP ARTHROPLASTY Left 10/05/2018   Procedure: HEMI HIP ARTHROPLASTY ANTERIOR APPROACH;  Surgeon: Leandrew Koyanagi, MD;  Location: [facility];  Service: Orthopedics;  Laterality: Left;    Social History   Socioeconomic History  . Marital status: Widowed    Spouse name: Not on file  . Number of children: Not on file  . Years of education: Not on file  . Highest education level: Not on file  Occupational History  . Occupation: retired   Tobacco Use  . Smoking status: Never Smoker  . Smokeless tobacco: Never Used  Substance and Sexual Activity  . Alcohol use: Not Currently  . Drug use: No  . Sexual activity: Not Currently  Other Topics Concern  . Not on file  Social History Narrative   Long term resident of [facility]   Unable to participate in questions    Social Determinants of Health   Financial Resource Strain:   . Difficulty of Paying Living Expenses: Not on file  Food Insecurity:   . Worried About Charity fundraiser in the Last Year: Not on file  . Ran Out of Food in the Last Year: Not on file  Transportation Needs:   . Lack of Transportation (Medical): Not on file  . Lack of Transportation (Non-Medical): Not on file  Physical Activity:   . Days of Exercise per Week: Not on file  . Minutes of Exercise per Session: Not on file  Stress:   . Feeling of Stress : Not on file  Social Connections:   . Frequency of Communication with Friends and Family: Not on file  . Frequency of Social Gatherings with Friends and Family: Not on file  . Attends Religious Services: Not on file  . Active Member of Clubs or Organizations: Not on file  . Attends Archivist Meetings: Not on file  . Marital Status: Not on file  Intimate Partner Violence:   . Fear of Current or Ex-Partner: Not on file  . Emotionally Abused: Not on file  . Physically Abused: Not on file  . Sexually Abused: Not on file   Family History  Problem Relation Age of Onset  . Heart attack Father        73s      VITAL SIGNS BP 120/64   Pulse 60   Temp 97.6 F (36.4 C) (Oral)   Resp 20   Ht 5\' 2"  (1.575 m)   Wt 90 lb 9.6 oz (41.1 kg)   BMI 16.57 kg/m   Outpatient Encounter Medications as of 02/09/2019  Medication Sig  . ALPRAZolam (XANAX) 0.25 MG tablet Take 1 tablet (0.25 mg total) by mouth at bedtime.  . Amino Acids-Protein Hydrolys (FEEDING SUPPLEMENT, PRO-STAT SUGAR FREE 64,) LIQD Take 30 mLs by mouth 2 (two) times daily between meals.  Marland Kitchen ascorbic acid (VITAMIN C) 500 MG tablet Take 500 mg by mouth daily.  Roseanne Kaufman [place]-Castor Oil [facility]) OINT Special Instructions: Apply to sacrum, coccyx and bilateral buttocks qshift & prn. Every Shift Day, Evening, Night  . busPIRone (BUSPAR) 5 MG  tablet Take 5 mg by mouth 2 (two) times daily.  . Cholecalciferol 1.25 MG (50000 UT) capsule Take 50,000 Units by mouth. Once a day on Thursday  . diltiazem (TIAZAC) 240 MG 24 hr capsule Take 240 mg by mouth daily.  . Eyelid Cleansers (OCUSOFT EYELID CLEANSING) PADS cleanse both eyes BID Twice A Day 09:00 AM, 09:00 PM  . levothyroxine (SYNTHROID, LEVOTHROID) 50 MCG tablet Take 50 mcg by mouth daily before breakfast.  . NON FORMULARY Diet Change: Dysphagia 1 (puree), thin liquids  . NON FORMULARY Magic cup daily- for increased nutrition intake Once A Day 02:00 PM  . polyethylene glycol (MIRALAX / GLYCOLAX) 17 g packet Take 17 g by mouth daily as needed for mild constipation.  . QUEtiapine Fumarate (SEROQUEL PO) Take 12.5 mg by mouth 2 (two) times daily.   . traMADol (ULTRAM) 50 MG tablet Take 1 tablet (50 mg total) by mouth every 8 (eight) hours.  Marland Kitchen zinc sulfate 220 (50 Zn) MG capsule Take 220 mg by mouth daily.   No facility-administered encounter medications on file as of 02/09/2019.     SIGNIFICANT DIAGNOSTIC EXAMS  PREVIOUS    10-03-18: left hip and pelvis x-ray: Mildly comminuted, foreshortened and slightly valgus angulated transcervical left femoral neck fracture. Remote appearing posttraumatic deformity of the left inferior and superior pubic ramus  10-03-18: chest x-ray:  1. No active disease.  Likely chronic coarse interstitial changes. 2. Question projectional irregularity versus fracture of the left humerus. Correlate with point tenderness and consider dedicated shoulder radiographs if there is clinical concern  10-03-18: left humerus x-ray: Age indeterminate fracture of the left humeral neck. Clinical correlation is recommended.  10-05-18: pelvic x-ray:  1. Interval left hip replacement with expected surgical change 2. Chronic deformities of the left superior and inferior pubic rami  NO NEW EXAMS.    LABS REVIEWED PREVIOUS;   03-17-18: wbc 5.7; hgb 12.4; hct 38.8;  mcv 108.1; plt 224; glucose 85; bun 13; creat 0.91; k+ 3.3; na++ 138; ca 8.7 protein 5.7 albumin 3.1 hgb 4.8 03-20-18: glucose 74; bun 14; creat 0.81; k+ 3.5; na++ 137; ca 9.2 04-19-28: tsh 93.852 05-18-18: tsh 31.786 06-29-18: vit B 12: 308; tsh 14.789 07-11-18: wbc 4.7; hgb 12.9; hct 39.1; mcv 104.5; plt 146 ; glucose 89; bun 24; creat 0.74; k+ 3.9; na++ 141; ca 9.0 liver normal albumin 3.2 tsh 13.945 free T4: 0.78 08-01-18: tsh 6.297 free T3; 2.2 free T4: 1.13 10-03-18: wbc 8.4; hgb 15.6; hct 48.0; mcv 99.2 plt 160; glucose 124; bun 30; creat 0.81; k+ 4.3;  na++ 142; ca 9.6 10-06-18: wbc 9.9; hgb 15.3; hct 46.1; mcv 99.1; plt 121; glucose 134; bun 15; creat 0.66; k+ 3.8; na++ 140; ca 8.8 10-09-18: wbc 7.8; hgb 13.2; hct 39.0; mcv 98.7 plt 135  TODAY;   01-26-19: wbc 5.8; hgb 13.6; hct  42.3; mcv 104.7 plt 145; iron 129 tibc 377; vit B 12: 392; folate 6.6; vit B 1: 90.0  Review of Systems  Unable to perform ROS: Dementia (unable to participate )    Physical Exam Constitutional:      General: She is not in acute distress.    Appearance: She is well-developed. She is not diaphoretic.  HENT:     Mouth/Throat:     Mouth: Mucous membranes are moist.     Pharynx: Oropharynx is clear.  Eyes:     Conjunctiva/sclera: Conjunctivae normal.  Neck:     Thyroid: No thyromegaly.  Cardiovascular:     Rate and Rhythm: Normal rate and regular rhythm.     Pulses: Normal pulses.     Heart sounds: Normal heart sounds.     Comments: History of cardiac stent Pulmonary:     Effort: Pulmonary effort is normal. No respiratory distress.     Breath sounds: Normal breath sounds.  Abdominal:     General: Bowel sounds are normal. There is no distension.     Palpations: Abdomen is soft.     Tenderness: There is no abdominal tenderness.  Genitourinary:    Comments: Foley  Musculoskeletal:     Cervical back: Neck supple.     Right lower leg: No edema.     Left lower leg: No edema.     Comments: Is able to move  all extremities  Is status post left hip hemi arthroplasty 10-05-18 Is status post left humeral fracture History of bilateral knee replacements      Lymphadenopathy:     Cervical: No cervical adenopathy.  Skin:    General: Skin is warm and dry.  Neurological:     Mental Status: She is alert. Mental status is at baseline.  Psychiatric:        Mood and Affect: Mood normal.       ASSESSMENT/ PLAN:  TODAY;   1. Atrial fibrillation with RVR: heart rate is stable will continue tiazac 240 mg daily for rate control will monitor   2. Closed nondisplaced fracture of surgical neck of left humerus unspecified morphology is stable will monitor   3. Closed displaced fracture of left femoral neck is stable her pain is under control with ultram 50 mg every 8 hours   4. Acquired hypothyroidism: is stable TSH 6.297 (free T3: 2.2; free T4: 1.13) will continue synthroid 50 mcg daily   5. Advanced dementia: no significant change in status weight is 91 pounds; will not make changes; weight loss is an unfortunate out come with the late stages of this disease process.   6. Seizure: is stable no reports of seizures will monitor   7. Psychosis in elderly with behavioral disturbance: is stable will continue seroquel 12.5 mg twice daily   8. Chronic anxiety: is stable will continue buspar 5 mg twice daily and xanax 0.25 mg nightly   9. Unspecified chronic CHF (congestive heart failure) is compensated will monitor   Health maintenance is up to date.   MD is aware of resident's narcotic use and is in agreement with current plan of care. We will attempt to wean resident as appropriate.  Ok Edwards NP [facility]  Contact [phone] Monday through Friday 8am- 5pm  After hours call [phone]

## 2019-02-11 ENCOUNTER — Other Ambulatory Visit: Payer: Self-pay

## 2019-02-11 ENCOUNTER — Telehealth: Payer: Self-pay | Admitting: Nurse Practitioner

## 2019-02-11 ENCOUNTER — Emergency Department (HOSPITAL_COMMUNITY): Payer: Medicare HMO

## 2019-02-11 ENCOUNTER — Encounter (HOSPITAL_COMMUNITY): Payer: Self-pay

## 2019-02-11 ENCOUNTER — Inpatient Hospital Stay (HOSPITAL_COMMUNITY)
Admission: EM | Admit: 2019-02-11 | Discharge: 2019-02-14 | DRG: 522 | Disposition: A | Discharge status: Skilled Nursing Facility | Payer: Medicare HMO | Attending: Internal Medicine | Admitting: Internal Medicine

## 2019-02-11 DIAGNOSIS — I4821 Permanent atrial fibrillation: Secondary | ICD-10-CM | POA: Diagnosis present

## 2019-02-11 DIAGNOSIS — F419 Anxiety disorder, unspecified: Secondary | ICD-10-CM | POA: Diagnosis present

## 2019-02-11 DIAGNOSIS — E7800 Pure hypercholesterolemia, unspecified: Secondary | ICD-10-CM | POA: Diagnosis present

## 2019-02-11 DIAGNOSIS — Z79899 Other long term (current) drug therapy: Secondary | ICD-10-CM

## 2019-02-11 DIAGNOSIS — I4891 Unspecified atrial fibrillation: Secondary | ICD-10-CM | POA: Diagnosis not present

## 2019-02-11 DIAGNOSIS — Z888 Allergy status to other drugs, medicaments and biological substances status: Secondary | ICD-10-CM | POA: Diagnosis not present

## 2019-02-11 DIAGNOSIS — Z515 Encounter for palliative care: Secondary | ICD-10-CM | POA: Diagnosis present

## 2019-02-11 DIAGNOSIS — E039 Hypothyroidism, unspecified: Secondary | ICD-10-CM | POA: Diagnosis present

## 2019-02-11 DIAGNOSIS — Z96642 Presence of left artificial hip joint: Secondary | ICD-10-CM | POA: Diagnosis present

## 2019-02-11 DIAGNOSIS — F03C0 Unspecified dementia, severe, without behavioral disturbance, psychotic disturbance, mood disturbance, and anxiety: Secondary | ICD-10-CM | POA: Diagnosis present

## 2019-02-11 DIAGNOSIS — S72001A Fracture of unspecified part of neck of right femur, initial encounter for closed fracture: Secondary | ICD-10-CM

## 2019-02-11 DIAGNOSIS — S72009A Fracture of unspecified part of neck of unspecified femur, initial encounter for closed fracture: Secondary | ICD-10-CM | POA: Diagnosis present

## 2019-02-11 DIAGNOSIS — Z85118 Personal history of other malignant neoplasm of bronchus and lung: Secondary | ICD-10-CM

## 2019-02-11 DIAGNOSIS — Z20828 Contact with and (suspected) exposure to other viral communicable diseases: Secondary | ICD-10-CM | POA: Diagnosis present

## 2019-02-11 DIAGNOSIS — D696 Thrombocytopenia, unspecified: Secondary | ICD-10-CM | POA: Diagnosis present

## 2019-02-11 DIAGNOSIS — R000 Tachycardia, unspecified: Secondary | ICD-10-CM | POA: Diagnosis present

## 2019-02-11 DIAGNOSIS — F0390 Unspecified dementia without behavioral disturbance: Secondary | ICD-10-CM | POA: Diagnosis present

## 2019-02-11 DIAGNOSIS — K219 Gastro-esophageal reflux disease without esophagitis: Secondary | ICD-10-CM | POA: Diagnosis present

## 2019-02-11 DIAGNOSIS — I110 Hypertensive heart disease with heart failure: Secondary | ICD-10-CM | POA: Diagnosis present

## 2019-02-11 DIAGNOSIS — S72011A Unspecified intracapsular fracture of right femur, initial encounter for closed fracture: Secondary | ICD-10-CM | POA: Diagnosis present

## 2019-02-11 DIAGNOSIS — Z8249 Family history of ischemic heart disease and other diseases of the circulatory system: Secondary | ICD-10-CM | POA: Diagnosis not present

## 2019-02-11 DIAGNOSIS — Z79890 Hormone replacement therapy (postmenopausal): Secondary | ICD-10-CM

## 2019-02-11 DIAGNOSIS — Z7401 Bed confinement status: Secondary | ICD-10-CM

## 2019-02-11 DIAGNOSIS — Z01818 Encounter for other preprocedural examination: Secondary | ICD-10-CM | POA: Diagnosis not present

## 2019-02-11 DIAGNOSIS — Z8673 Personal history of transient ischemic attack (TIA), and cerebral infarction without residual deficits: Secondary | ICD-10-CM | POA: Diagnosis not present

## 2019-02-11 DIAGNOSIS — W19XXXA Unspecified fall, initial encounter: Secondary | ICD-10-CM | POA: Diagnosis present

## 2019-02-11 DIAGNOSIS — Z955 Presence of coronary angioplasty implant and graft: Secondary | ICD-10-CM | POA: Diagnosis not present

## 2019-02-11 DIAGNOSIS — Y92122 Bedroom in nursing home as the place of occurrence of the external cause: Secondary | ICD-10-CM | POA: Diagnosis not present

## 2019-02-11 DIAGNOSIS — R569 Unspecified convulsions: Secondary | ICD-10-CM | POA: Diagnosis present

## 2019-02-11 DIAGNOSIS — R451 Restlessness and agitation: Secondary | ICD-10-CM | POA: Diagnosis not present

## 2019-02-11 DIAGNOSIS — Z66 Do not resuscitate: Secondary | ICD-10-CM | POA: Diagnosis present

## 2019-02-11 DIAGNOSIS — I5032 Chronic diastolic (congestive) heart failure: Secondary | ICD-10-CM | POA: Diagnosis present

## 2019-02-11 DIAGNOSIS — Z96653 Presence of artificial knee joint, bilateral: Secondary | ICD-10-CM | POA: Diagnosis present

## 2019-02-11 DIAGNOSIS — Z09 Encounter for follow-up examination after completed treatment for conditions other than malignant neoplasm: Secondary | ICD-10-CM

## 2019-02-11 HISTORY — DX: Dysphagia, unspecified: R13.10

## 2019-02-11 HISTORY — DX: Hypokalemia: E87.6

## 2019-02-11 LAB — CBC WITH DIFFERENTIAL/PLATELET
Abs Immature Granulocytes: 0.06 10*3/uL (ref 0.00–0.07)
Basophils Absolute: 0.1 10*3/uL (ref 0.0–0.1)
Basophils Relative: 1 %
Eosinophils Absolute: 0.1 10*3/uL (ref 0.0–0.5)
Eosinophils Relative: 1 %
HCT: 43.7 % (ref 36.0–46.0)
Hemoglobin: 14.7 g/dL (ref 12.0–15.0)
Immature Granulocytes: 1 %
Lymphocytes Relative: 15 %
Lymphs Abs: 1.4 10*3/uL (ref 0.7–4.0)
MCH: 34.3 pg — ABNORMAL HIGH (ref 26.0–34.0)
MCHC: 33.6 g/dL (ref 30.0–36.0)
MCV: 102.1 fL — ABNORMAL HIGH (ref 80.0–100.0)
Monocytes Absolute: 1 10*3/uL (ref 0.1–1.0)
Monocytes Relative: 11 %
Neutro Abs: 6.6 10*3/uL (ref 1.7–7.7)
Neutrophils Relative %: 71 %
Platelets: 144 10*3/uL — ABNORMAL LOW (ref 150–400)
RBC: 4.28 MIL/uL (ref 3.87–5.11)
RDW: 14.2 % (ref 11.5–15.5)
WBC: 9.2 10*3/uL (ref 4.0–10.5)
nRBC: 0 % (ref 0.0–0.2)

## 2019-02-11 LAB — BASIC METABOLIC PANEL
Anion gap: 9 (ref 5–15)
BUN: 22 mg/dL (ref 8–23)
CO2: 24 mmol/L (ref 22–32)
Calcium: 9.3 mg/dL (ref 8.9–10.3)
Chloride: 106 mmol/L (ref 98–111)
Creatinine, Ser: 0.64 mg/dL (ref 0.44–1.00)
GFR calc Af Amer: >60 mL/min (ref >60)
GFR calc non Af Amer: >60 mL/min (ref >60)
Glucose, Bld: 122 mg/dL — ABNORMAL HIGH (ref 70–99)
Potassium: 4.3 mmol/L (ref 3.5–5.1)
Sodium: 139 mmol/L (ref 135–145)

## 2019-02-11 LAB — SARS CORONAVIRUS 2 (TAT 6-24 HRS): SARS Coronavirus 2: NEGATIVE

## 2019-02-11 MED ORDER — BUSPIRONE HCL 5 MG PO TABS
5.0000 mg | ORAL_TABLET | Freq: Two times a day (BID) | ORAL | Status: DC
  Administered 2019-02-11 – 2019-02-14 (×7): 5 mg via ORAL
  Filled 2019-02-11 (×7): qty 1

## 2019-02-11 MED ORDER — ONDANSETRON HCL 4 MG/2ML IJ SOLN: 4.0000 mg | Freq: Four times a day (QID) | INTRAMUSCULAR | Status: DC | PRN

## 2019-02-11 MED ORDER — ALPRAZOLAM 0.25 MG PO TABS
0.2500 mg | ORAL_TABLET | Freq: Every day | ORAL | Status: DC
  Administered 2019-02-11 – 2019-02-13 (×3): 0.25 mg via ORAL
  Filled 2019-02-11 (×3): qty 1

## 2019-02-11 MED ORDER — QUETIAPINE FUMARATE 25 MG PO TABS
12.5000 mg | ORAL_TABLET | Freq: Two times a day (BID) | ORAL | Status: DC
  Administered 2019-02-11 – 2019-02-14 (×7): 12.5 mg via ORAL
  Filled 2019-02-11 (×7): qty 1

## 2019-02-11 MED ORDER — DILTIAZEM HCL ER COATED BEADS 240 MG PO CP24
240.0000 mg | ORAL_CAPSULE | Freq: Every day | ORAL | Status: DC
  Administered 2019-02-11: 09:00:00 240 mg via ORAL
  Filled 2019-02-11 (×2): qty 1

## 2019-02-11 MED ORDER — LEVOTHYROXINE SODIUM 50 MCG PO TABS
50.0000 ug | ORAL_TABLET | Freq: Every day | ORAL | Status: DC
  Administered 2019-02-12 – 2019-02-14 (×3): 50 ug via ORAL
  Filled 2019-02-11 (×3): qty 1

## 2019-02-11 MED ORDER — LACTATED RINGERS IV SOLN: INTRAVENOUS | Status: AC

## 2019-02-11 MED ORDER — POLYETHYLENE GLYCOL 3350 17 G PO PACK: 17.0000 g | PACK | Freq: Every day | ORAL | Status: DC | PRN

## 2019-02-11 MED ORDER — ONDANSETRON HCL 4 MG/2ML IJ SOLN
4.0000 mg | Freq: Once | INTRAMUSCULAR | Status: AC
  Administered 2019-02-11: 09:00:00 4 mg via INTRAVENOUS
  Filled 2019-02-11: qty 2

## 2019-02-11 MED ORDER — ACETAMINOPHEN 325 MG PO TABS: 650.0000 mg | ORAL_TABLET | Freq: Four times a day (QID) | ORAL | Status: DC | PRN

## 2019-02-11 MED ORDER — DILTIAZEM HCL-DEXTROSE 125-5 MG/125ML-% IV SOLN (PREMIX)
5.0000 mg/h | INTRAVENOUS | Status: DC
  Administered 2019-02-11: 12.5 mg/h via INTRAVENOUS
  Administered 2019-02-11: 5 mg/h via INTRAVENOUS
  Administered 2019-02-12 – 2019-02-13 (×4): 12.5 mg/h via INTRAVENOUS
  Filled 2019-02-11 (×6): qty 125

## 2019-02-11 MED ORDER — MORPHINE SULFATE (PF) 2 MG/ML IV SOLN
2.0000 mg | Freq: Once | INTRAVENOUS | Status: AC
  Administered 2019-02-11: 09:00:00 2 mg via INTRAVENOUS
  Filled 2019-02-11: qty 1

## 2019-02-11 MED ORDER — ZINC SULFATE 220 (50 ZN) MG PO CAPS
220.0000 mg | ORAL_CAPSULE | Freq: Every day | ORAL | Status: DC
  Administered 2019-02-11 – 2019-02-14 (×2): 220 mg via ORAL
  Filled 2019-02-11 (×4): qty 1

## 2019-02-11 MED ORDER — ACETAMINOPHEN 650 MG RE SUPP: 650.0000 mg | Freq: Four times a day (QID) | RECTAL | Status: DC | PRN

## 2019-02-11 MED ORDER — LACTATED RINGERS IV SOLN: INTRAVENOUS | Status: DC

## 2019-02-11 MED ORDER — ONDANSETRON HCL 4 MG PO TABS: 4.0000 mg | ORAL_TABLET | Freq: Four times a day (QID) | ORAL | Status: DC | PRN

## 2019-02-11 MED ORDER — ASCORBIC ACID 500 MG PO TABS
500.0000 mg | ORAL_TABLET | Freq: Every day | ORAL | Status: DC
  Administered 2019-02-11 – 2019-02-14 (×3): 500 mg via ORAL
  Filled 2019-02-11 (×4): qty 1

## 2019-02-11 MED ORDER — MUPIROCIN 2 % EX OINT
1.0000 "application " | TOPICAL_OINTMENT | Freq: Two times a day (BID) | CUTANEOUS | Status: DC
  Filled 2019-02-11: qty 22

## 2019-02-11 MED ORDER — PRO-STAT SUGAR FREE PO LIQD
30.0000 mL | Freq: Two times a day (BID) | ORAL | Status: DC
  Administered 2019-02-13 – 2019-02-14 (×4): 30 mL via ORAL
  Filled 2019-02-11 (×8): qty 30

## 2019-02-11 MED ORDER — DILTIAZEM HCL 25 MG/5ML IV SOLN
10.0000 mg | Freq: Once | INTRAVENOUS | Status: AC
  Administered 2019-02-11: 10 mg via INTRAVENOUS
  Filled 2019-02-11: qty 5

## 2019-02-11 NOTE — Progress Notes (Signed)
Patient seen and examined. Patient is demented, she is not able to provide history. On presentation she was in A fib RVR.  HR in 100 range. Cardiology consulted for pre op evaluation.   Niel Hummer, MD.

## 2019-02-11 NOTE — Telephone Encounter (Signed)
Late entry from on call on 12/19, nursing from [facility] called stating pt was having some discomfort of her right hip and requesting xray- reports they felt like she twisted it,no known injury, xray ordered and at 7:30 am on 12/20 xray was called that she had a fracture of right proximal femur. Orders given to send to the ED for further evaluation at that time.  Fwd to PCP for [facility]

## 2019-02-11 NOTE — ED Triage Notes (Signed)
Pt from [facility].  Staff reports Thursday they found her with r leg stuck under the wheel of the bed.  Reports noticed shortening to r leg and had xray yesterday.  Reports femur fracture.

## 2019-02-11 NOTE — H&P (Addendum)
History and Physical    Casey Wood QMV:[id number] DOB: 1922-07-26 DOA: 02/11/2019  PCP: Hennie Duos, MD   Patient coming from: [facility]  Chief Complaint: Fall on 12/17  HPI: CARIANNA LAGUE is a 85 y.o. female with medical history significant for atrial fibrillation not on anticoagulation, recent left femoral neck fracture with arthroplasty on 10/05/2018 dementia with psychosis, CHF, seizures, hypothyroidism, and hypertension who was brought from her SNF due to a fall that was noticed approximately 3 days ago.  At that time her right foot was wedged underneath her bed.  She was noted to have a hip x-ray on 12/19 demonstrating a displaced subcapital right femoral fracture.  She was therefore, sent to ED today for further evaluation.  She cannot give any history herself as she has advanced dementia and expresses significant pain with any movement of her right lower extremity.   ED Course: Vital signs demonstrate tachycardia with atrial fibrillation/RVR.  Patient was given her oral Cardizem, but she had spit this back up and therefore was given an IV bolus of Cardizem with transient improvement.  Her heart rate has once again become elevated into the 120 bpm range.  It is difficult to ascertain whether or not patient is having any chest pain or shortness of breath, but appears to be stable from a respiratory standpoint and is currently on room air.  EDP had discussed case with Dr. Lorin Mercy of orthopedics who agrees to see patient at [facility] and states that patient requires transfer.  Chest x-ray with no acute findings and Covid testing is currently pending.  Review of Systems: Cannot be obtained given patient condition.  Past Medical History:  Diagnosis Date  . Alcohol abuse 02/07/2018  . Atrial fibrillation ([place])   . CHF (congestive heart failure) ([place]) 02/07/2018  . Dementia ([place]) 02/07/2018  . Dysphagia   . GERD (gastroesophageal reflux disease)   . High cholesterol     . Hypertension   . Hypokalemia   . Lung cancer ([place])   . Psychosis ([place]) 04/08/2018  . Seizures ([place]) 02/07/2018  . Thyroid disease   . TIA (transient ischemic attack)     Past Surgical History:  Procedure Laterality Date  . cardiac implants and grafts    . LOBECTOMY    . REPLACEMENT TOTAL KNEE Bilateral   . TOTAL HIP ARTHROPLASTY Left 10/05/2018   Procedure: HEMI HIP ARTHROPLASTY ANTERIOR APPROACH;  Surgeon: Leandrew Koyanagi, MD;  Location: [place];  Service: Orthopedics;  Laterality: Left;     reports that she has never smoked. She has never used smokeless tobacco. She reports previous alcohol use. She reports that she does not use drugs.  Allergies  Allergen Reactions  . Levofloxacin     unknown    Family History  Problem Relation Age of Onset  . Heart attack Father        78s    Prior to Admission medications   Medication Sig Start Date End Date Taking? Authorizing Provider  ALPRAZolam (XANAX) 0.25 MG tablet Take 1 tablet (0.25 mg total) by mouth at bedtime. 01/30/19  Yes Gerlene Fee, NP  ascorbic acid (VITAMIN C) 500 MG tablet Take 500 mg by mouth daily. 02/01/19 02/14/19 Yes [provider]  Janne Lab Oil (VENELEX) OINT Apply 1 application topically See admin instructions. Apply to sacrum, coccyx, and bilateral buttocks every shift and as needed for wound   Yes [provider]  busPIRone (BUSPAR) 5 MG tablet Take 5 mg  by mouth 2 (two) times daily.   Yes [provider]  Cholecalciferol 1.25 MG (50000 UT) capsule Take 50,000 Units by mouth. Once a day on Thursday   Yes [provider]  diltiazem (TIAZAC) 240 MG 24 hr capsule Take 240 mg by mouth daily. 10/10/18  Yes [provider]  Eyelid Cleansers (OCUSOFT EYELID CLEANSING) PADS Apply 1 application topically 2 (two) times daily. cleanse both eyes BID Twice A Day 09:00 AM, 09:00 PM    Yes [provider]  levothyroxine (SYNTHROID, LEVOTHROID) 50 MCG tablet  Take 50 mcg by mouth daily before breakfast.   Yes [provider]  polyethylene glycol (MIRALAX / GLYCOLAX) 17 g packet Take 17 g by mouth daily as needed for mild constipation. 10/09/18  Yes Shelly Coss, MD  QUEtiapine Fumarate (SEROQUEL PO) Take 12.5 mg by mouth 2 (two) times daily.  07/31/18  Yes [provider]  traMADol (ULTRAM) 50 MG tablet Take 1 tablet (50 mg total) by mouth every 8 (eight) hours. 01/17/19  Yes Gerlene Fee, NP  zinc sulfate 220 (50 Zn) MG capsule Take 220 mg by mouth daily. 01/31/19  Yes [provider]  Amino Acids-Protein Hydrolys (FEEDING SUPPLEMENT, PRO-STAT SUGAR FREE 64,) LIQD Take 30 mLs by mouth 2 (two) times daily between meals.    [provider]  NON FORMULARY Diet Change: Dysphagia 1 (puree), thin liquids 10/10/18   [provider]  NON FORMULARY Magic cup daily- for increased nutrition intake Once A Day 02:00 PM    [provider]    Physical Exam: Vitals:   02/11/19 0900 02/11/19 0928 02/11/19 0955 02/11/19 1002  BP: 127/90 127/90 123/87 110/74  Pulse:   (!) 143 (!) 101  Resp: 16  18 15   Temp:      TempSrc:      SpO2:   95% 94%  Weight:      Height:        Constitutional: NAD, hard of hearing and appears confused Vitals:   02/11/19 0900 02/11/19 0928 02/11/19 0955 02/11/19 1002  BP: 127/90 127/90 123/87 110/74  Pulse:   (!) 143 (!) 101  Resp: 16  18 15   Temp:      TempSrc:      SpO2:   95% 94%  Weight:      Height:       Eyes: lids and conjunctivae normal ENMT: Mucous membranes are moist.  Neck: normal, supple Respiratory: clear to auscultation bilaterally. Normal respiratory effort. No accessory muscle use.  Currently on room air. Cardiovascular: Irregular rate with tachycardia, no murmurs. No extremity edema. Abdomen: no tenderness, no distention. Bowel sounds positive.  Musculoskeletal: Right lower extremity shortened with some external rotation Skin: no rashes, lesions,  ulcers.  Psychiatric: Difficult to assess given patient condition  Labs on Admission: I have personally reviewed following labs and imaging studies  CBC: Recent Labs  Lab 02/11/19 0845  WBC 9.2  NEUTROABS 6.6  HGB 14.7  HCT 43.7  MCV 102.1*  PLT 277*   Basic Metabolic Panel: Recent Labs  Lab 02/11/19 0845  NA 139  K 4.3  CL 106  CO2 24  GLUCOSE 122*  BUN 22  CREATININE 0.64  CALCIUM 9.3   GFR: Estimated Creatinine Clearance: 28.5 mL/min (by C-G formula based on SCr of 0.64 mg/dL). Liver Function Tests: No results for input(s): AST, ALT, ALKPHOS, BILITOT, PROT, ALBUMIN in the last 168 hours. No results for input(s): LIPASE, AMYLASE in the last 168 hours.  No results for input(s): AMMONIA in the last 168 hours. Coagulation Profile: No results for input(s): INR, PROTIME in the last 168 hours. Cardiac Enzymes: No results for input(s): CKTOTAL, CKMB, CKMBINDEX, TROPONINI in the last 168 hours. BNP (last 3 results) No results for input(s): PROBNP in the last 8760 hours. HbA1C: No results for input(s): HGBA1C in the last 72 hours. CBG: No results for input(s): GLUCAP in the last 168 hours. Lipid Profile: No results for input(s): CHOL, HDL, LDLCALC, TRIG, CHOLHDL, LDLDIRECT in the last 72 hours. Thyroid Function Tests: No results for input(s): TSH, T4TOTAL, FREET4, T3FREE, THYROIDAB in the last 72 hours. Anemia Panel: No results for input(s): VITAMINB12, FOLATE, FERRITIN, TIBC, IRON, RETICCTPCT in the last 72 hours. Urine analysis: No results found for: COLORURINE, APPEARANCEUR, LABSPEC, [place], GLUCOSEU, [place], BILIRUBINUR, KETONESUR, PROTEINUR, UROBILINOGEN, NITRITE, LEUKOCYTESUR  Radiological Exams on Admission: DG Chest 1 View  Result Date: 02/11/2019 CLINICAL DATA:  Femur fracture EXAM: CHEST  1 VIEW COMPARISON:  10/03/2018 FINDINGS: Borderline heart size with aortic tortuosity distorted by leftward rotation. Anterior right fourth rib sclerotic focus also seen  on prior, likely remote fracture or bone island in isolation. Remote left humeral neck fracture. Midthoracic compression fractures not primarily evaluated. There is no edema, consolidation, effusion, or pneumothorax. IMPRESSION: No evidence of acute cardiopulmonary disease. Electronically Signed   By: Monte Fantasia M.D.   On: 02/11/2019 09:46   DG HIP UNILAT WITH PELVIS 2-3 VIEWS RIGHT  Result Date: 02/11/2019 CLINICAL DATA:  Fall with femur fracture EXAM: DG HIP (WITH OR WITHOUT PELVIS) 2-3V RIGHT COMPARISON:  12/26/2018 FINDINGS: Right femoral neck fracture with moderate displacement, subcapital in appearance. Left hip hemiarthroplasty and remote left obturator ring fracture. The bones are osteopenic. Prominent stool volume over the pelvis. IMPRESSION: Displaced right femoral neck fracture. Electronically Signed   By: Monte Fantasia M.D.   On: 02/11/2019 09:45    EKG: Independently reviewed.  Atrial fibrillation 144 bpm with artifact.  Assessment/Plan Active Problems:   Hip fracture (HCC)    Right displaced femoral neck fracture status post fall -Recent left hip hemiarthroplasty on 10/05/2018 secondary to fall -Transfer to [facility] for orthopedic evaluation -Keep n.p.o. for now  Atrial fibrillation with RVR-possibly related to medication noncompliance -IV Cardizem bolus given with transient improvement in heart rate -She still remains in the 120 bpm range and will start Cardizem infusion for now -She does not appear to be taking her oral Cardizem well and seemed to spit up her medication while here -Not on anticoagulation given falls  Dementia with psychosis -Currently calm, but confused -Continue on Seroquel, BuSpar, and Xanax  History of CHF -Currently compensated -Monitor daily weights with gentle IV fluid for now, time-limited  History of seizures -Not currently on antiepileptic agents  Hypothyroidism -Continue Synthroid  Hypertension -Monitor while on Cardizem  infusion -Currently stable  Thrombocytopenia -Monitor on repeat CBC -Hold heparin agents   DVT prophylaxis: SCDs Code Status: DNR Family Communication: None at bedside, tried calling daughter Clemens Catholic with no response Disposition Plan: Transfer to [facility] for orthopedics evaluation for right hip fracture, Cardizem infusion for A. fib with RVR Consults called: EDP discussed with Dr. Lorin Mercy of orthopedics at [facility] Admission status: Inpatient, telemetry   [facility]  If 7PM-7AM, please contact night-coverage [email or web address]  02/11/2019, 10:32 AM

## 2019-02-11 NOTE — ED Notes (Signed)
[facility] called spoke with Abbe Amsterdam will send transportation for pt to [facility].

## 2019-02-11 NOTE — ED Notes (Signed)
Mucus membranes dry and scaly.  Oral care done.

## 2019-02-11 NOTE — ED Provider Notes (Signed)
[facility] EMERGENCY DEPARTMENT Provider Note   CSN: [id number] Arrival date & time: 02/11/19  4098     History Chief Complaint  Patient presents with  . Hip Injury    Casey Wood is a 85 y.o. female with a history as outlined below, most significant for atrial fibrillation, CHF, dementia, hypertension, history of lung cancer, who currently resides in a local skilled care facility and was found 3 days ago on the floor of her room with her right foot wedged underneath her bed.  She had an x-ray of her right hip the following day, report received today indicating a subcapital right femoral fracture.  She is normally ambulatory.  She underwent a left hip arthroplasty by Dr. Erlinda Hong in September secondary to left hip fracture.  Patient has advanced dementia and is unable to give any history.  She does express significant pain with any movement of the right lower extremity which she is holding in external hip rotation.     The history is provided by the nursing home.       Past Medical History:  Diagnosis Date  . Alcohol abuse 02/07/2018  . Atrial fibrillation ([place])   . CHF (congestive heart failure) ([place]) 02/07/2018  . Dementia ([place]) 02/07/2018  . Dysphagia   . GERD (gastroesophageal reflux disease)   . High cholesterol   . Hypertension   . Hypokalemia   . Lung cancer ([place])   . Psychosis ([place]) 04/08/2018  . Seizures ([place]) 02/07/2018  . Thyroid disease   . TIA (transient ischemic attack)     Patient Active Problem List   Diagnosis Date Noted  . Hip fracture ([place]) 02/11/2019  . Medicare annual wellness visit, subsequent 01/23/2019  . Unstageable pressure ulcer of sacral region ([place]) 11/11/2018  . Closed left humeral fracture 10/14/2018  . Goals of care, counseling/discussion   . Palliative care by specialist   . Left displaced femoral neck fracture ([place]) 10/03/2018  . Left ankle sprain 06/02/2018  . Chronic anxiety 05/11/2018  . Acquired hypothyroidism 04/25/2018    . Psychosis in elderly with behavioral disturbance ([place]) 04/08/2018  . Atrial fibrillation with RVR ([place]) 02/07/2018  . Advanced dementia ([place]) 02/07/2018  . Seizures ([place]) 02/07/2018  . Alcohol abuse 02/07/2018  . CHF (congestive heart failure) ([place]) 02/07/2018    Past Surgical History:  Procedure Laterality Date  . cardiac implants and grafts    . LOBECTOMY    . REPLACEMENT TOTAL KNEE Bilateral   . TOTAL HIP ARTHROPLASTY Left 10/05/2018   Procedure: HEMI HIP ARTHROPLASTY ANTERIOR APPROACH;  Surgeon: Leandrew Koyanagi, MD;  Location: [place];  Service: Orthopedics;  Laterality: Left;     OB History   No obstetric history on file.     Family History  Problem Relation Age of Onset  . Heart attack Father        41s    Social History   Tobacco Use  . Smoking status: Never Smoker  . Smokeless tobacco: Never Used  Substance Use Topics  . Alcohol use: Not Currently  . Drug use: No    Home Medications Prior to Admission medications   Medication Sig Start Date End Date Taking? Authorizing Provider  ALPRAZolam (XANAX) 0.25 MG tablet Take 1 tablet (0.25 mg total) by mouth at bedtime. 01/30/19  Yes Gerlene Fee, NP  ascorbic acid (VITAMIN C) 500 MG tablet Take 500 mg by mouth daily. 02/01/19 02/14/19 Yes [provider]  Janne Lab Oil (Arsenio Loader) OINT Apply 1 application  topically See admin instructions. Apply to sacrum, coccyx, and bilateral buttocks every shift and as needed for wound   Yes [provider]  busPIRone (BUSPAR) 5 MG tablet Take 5 mg by mouth 2 (two) times daily.   Yes [provider]  Cholecalciferol 1.25 MG (50000 UT) capsule Take 50,000 Units by mouth. Once a day on Thursday   Yes [provider]  diltiazem (TIAZAC) 240 MG 24 hr capsule Take 240 mg by mouth daily. 10/10/18  Yes [provider]  Eyelid Cleansers (OCUSOFT EYELID CLEANSING) PADS Apply 1 application topically 2 (two) times daily. cleanse both eyes  BID Twice A Day 09:00 AM, 09:00 PM    Yes [provider]  levothyroxine (SYNTHROID, LEVOTHROID) 50 MCG tablet Take 50 mcg by mouth daily before breakfast.   Yes [provider]  polyethylene glycol (MIRALAX / GLYCOLAX) 17 g packet Take 17 g by mouth daily as needed for mild constipation. 10/09/18  Yes Shelly Coss, MD  QUEtiapine Fumarate (SEROQUEL PO) Take 12.5 mg by mouth 2 (two) times daily.  07/31/18  Yes [provider]  traMADol (ULTRAM) 50 MG tablet Take 1 tablet (50 mg total) by mouth every 8 (eight) hours. 01/17/19  Yes Gerlene Fee, NP  zinc sulfate 220 (50 Zn) MG capsule Take 220 mg by mouth daily. 01/31/19  Yes [provider]  Amino Acids-Protein Hydrolys (FEEDING SUPPLEMENT, PRO-STAT SUGAR FREE 64,) LIQD Take 30 mLs by mouth 2 (two) times daily between meals.    [provider]  NON FORMULARY Diet Change: Dysphagia 1 (puree), thin liquids 10/10/18   [provider]  NON FORMULARY Magic cup daily- for increased nutrition intake Once A Day 02:00 PM    [provider]    Allergies    Levofloxacin  Review of Systems   Review of Systems  Unable to perform ROS: Dementia    Physical Exam Updated Vital Signs BP 110/74 (BP Location: Right Arm)   Pulse (!) 101   Temp 98.2 F (36.8 C) (Oral)   Resp 15   Ht 5\' 2"  (1.575 m)   Wt 42 kg   SpO2 94%   BMI 16.94 kg/m   Physical Exam Vitals and nursing note reviewed.  Constitutional:      General: She is not in acute distress.    Appearance: She is well-developed.  HENT:     Head: Normocephalic and atraumatic.  Eyes:     Conjunctiva/sclera: Conjunctivae normal.  Cardiovascular:     Rate and Rhythm: Normal rate and regular rhythm.     Pulses:          Popliteal pulses are 2+ on the right side and 2+ on the left side.     Heart sounds: Normal heart sounds.  Pulmonary:     Effort: Pulmonary effort is normal.     Breath sounds: Normal breath sounds. No  wheezing.  Abdominal:     General: Bowel sounds are normal.     Palpations: Abdomen is soft.     Tenderness: There is no abdominal tenderness.  Musculoskeletal:        General: Tenderness present. Normal range of motion.     Cervical back: Normal range of motion.     Right hip: Tenderness present.     Comments: Right leg with shortening, external rotation of the hip.  Skin:    General: Skin is warm and dry.  Neurological:     Mental Status: She is alert. She is disoriented.  Comments: Suspected baseline dementia     ED Results / Procedures / Treatments   Labs (all labs ordered are listed, but only abnormal results are displayed) Labs Reviewed  CBC WITH DIFFERENTIAL/PLATELET - Abnormal; Notable for the following components:      Result Value   MCV 102.1 (*)    MCH 34.3 (*)    Platelets 144 (*)    All other components within normal limits  BASIC METABOLIC PANEL - Abnormal; Notable for the following components:   Glucose, Bld 122 (*)    All other components within normal limits  SARS CORONAVIRUS 2 (TAT 6-24 HRS)    EKG None  Radiology DG Chest 1 View  Result Date: 02/11/2019 CLINICAL DATA:  Femur fracture EXAM: CHEST  1 VIEW COMPARISON:  10/03/2018 FINDINGS: Borderline heart size with aortic tortuosity distorted by leftward rotation. Anterior right fourth rib sclerotic focus also seen on prior, likely remote fracture or bone island in isolation. Remote left humeral neck fracture. Midthoracic compression fractures not primarily evaluated. There is no edema, consolidation, effusion, or pneumothorax. IMPRESSION: No evidence of acute cardiopulmonary disease. Electronically Signed   By: Monte Fantasia M.D.   On: 02/11/2019 09:46   DG HIP UNILAT WITH PELVIS 2-3 VIEWS RIGHT  Result Date: 02/11/2019 CLINICAL DATA:  Fall with femur fracture EXAM: DG HIP (WITH OR WITHOUT PELVIS) 2-3V RIGHT COMPARISON:  12/26/2018 FINDINGS: Right femoral neck fracture with moderate displacement,  subcapital in appearance. Left hip hemiarthroplasty and remote left obturator ring fracture. The bones are osteopenic. Prominent stool volume over the pelvis. IMPRESSION: Displaced right femoral neck fracture. Electronically Signed   By: Monte Fantasia M.D.   On: 02/11/2019 09:45    Procedures Procedures (including critical care time)  Medications Ordered in ED Medications  ondansetron (ZOFRAN) injection 4 mg (4 mg Intravenous Given 02/11/19 0856)  morphine 2 MG/ML injection 2 mg (2 mg Intravenous Given 02/11/19 0856)  diltiazem (CARDIZEM) injection 10 mg (10 mg Intravenous Given 02/11/19 0086)    ED Course  I have reviewed the triage vital signs and the nursing notes.  Pertinent labs & imaging results that were available during my care of the patient were reviewed by me and considered in my medical decision making (see chart for details).    MDM Rules/Calculators/A&P                      Patient presents with a now 54-day-old right femoral fracture.  Imaging not available although patient presents with image report.  X-ray was repeated here.  She has a history of A. fib and is currently in RVR.  She has not had her morning extended release diltiazem today.  This was given.  She was also given pain medication per IV.  Pt kept spitting out her diltiazem PO dose, switched to IV dosing. Discussed case with Dr. Lorin Mercy with [facility] who request hospitalist admission, transfer to [facility], npo with anticipation of surgical repair this later today/evening.  Discussed pt with Dr. Manuella Ghazi with the hospitalist group who assumes care of pt.  Final Clinical Impression(s) / ED Diagnoses Final diagnoses:  Closed fracture of right hip, initial encounter [facility])  Atrial fibrillation with RVR [facility])    Rx / DC Orders ED Discharge Orders    None       Landis Martins 02/11/19 1023    Fredia Sorrow, MD 02/11/19 1346

## 2019-02-11 NOTE — ED Notes (Signed)
Report given to [facility] at [place].

## 2019-02-11 NOTE — Consult Note (Signed)
In consultation:   Patient ID: [name] MRN: [id number]; DOB: September 23, 1922  Admit date: 02/11/2019 Date of Consult: 02/11/2019  Primary Care Provider: Hennie Duos, MD Primary Cardiologist: No primary care provider on file.  Primary Electrophysiologist:  None    Patient Profile:   Casey Wood is a 85 y.o. female with a hx of alcohol abuse, dementia, atrial fibrillation, hypothyroidism, hypertension, lung cancer status post lobectomy, TIA, seizure who is being seen today for pre-op evaluation at the request of Dr Tyrell Antonio.  History of Present Illness:   Casey Wood is a 85 year old woman with history of alcohol abuse, dementia, atrial fibrillation not on anticoagulation, hypothyroidism, hypertension, lung cancer status post lobectomy, TIA, seizure, recent left femoral neck fracture with arthroplasty on 10/05/2018 who presents for preoperative evaluation after presenting with right hip fracture.  She had a fall at her SN.  X-ray shows a displaced subcapital right femoral neck fracture.  In the ED was noted to be in A. fib with RVR.  She was given a bolus of Cardizem with improvement and then started on Cardizem drip.  She was seen by cardiology in August 2020 for preop evaluation prior to her last hip for pain management.  She was evaluated by cardiology at that time, who assessed that she was a high risk patient given her age and comorbidities.  Dr. Martinique discussed this with patient's daughter, who wished to proceed with surgery for pain management.  An echo was ordered but this was not done as would not change management.  Patient is unable to participate in interview due to dementia.  Does not answer questions appropriately.  Heart Pathway Score:     Past Medical History:  Diagnosis Date  . Alcohol abuse 02/07/2018  . Atrial fibrillation ([place])   . CHF (congestive heart failure) ([place]) 02/07/2018  . Dementia ([place]) 02/07/2018  . Dysphagia   . GERD (gastroesophageal  reflux disease)   . High cholesterol   . Hypertension   . Hypokalemia   . Lung cancer ([place])   . Psychosis ([place]) 04/08/2018  . Seizures ([place]) 02/07/2018  . Thyroid disease   . TIA (transient ischemic attack)     Past Surgical History:  Procedure Laterality Date  . cardiac implants and grafts    . LOBECTOMY    . REPLACEMENT TOTAL KNEE Bilateral   . TOTAL HIP ARTHROPLASTY Left 10/05/2018   Procedure: HEMI HIP ARTHROPLASTY ANTERIOR APPROACH;  Surgeon: Leandrew Koyanagi, MD;  Location: [facility];  Service: Orthopedics;  Laterality: Left;    Inpatient Medications: Scheduled Meds: . ALPRAZolam  0.25 mg Oral QHS  . ascorbic acid  500 mg Oral Daily  . busPIRone  5 mg Oral BID  . feeding supplement (PRO-STAT SUGAR FREE 64)  30 mL Oral BID BM  . [START ON 02/12/2019] levothyroxine  50 mcg Oral QAC breakfast  . QUEtiapine  12.5 mg Oral BID  . zinc sulfate  220 mg Oral Daily   Continuous Infusions: . diltiazem (CARDIZEM) infusion 12.5 mg/hr (02/11/19 1209)  . lactated ringers 75 mL/hr at 02/11/19 1113   PRN Meds: acetaminophen **OR** acetaminophen, ondansetron **OR** ondansetron (ZOFRAN) IV, polyethylene glycol  Allergies:    Allergies  Allergen Reactions  . Levofloxacin     unknown    Social History:   Social History   Socioeconomic History  . Marital status: Widowed    Spouse name: Not on file  . Number of children: Not on file  . Years of education: Not on  file  . Highest education level: Not on file  Occupational History  . Occupation: retired   Tobacco Use  . Smoking status: Never Smoker  . Smokeless tobacco: Never Used  Substance and Sexual Activity  . Alcohol use: Not Currently  . Drug use: No  . Sexual activity: Not Currently  Other Topics Concern  . Not on file  Social History Narrative   [facility] resident of [facility]   Unable to participate in questions    Social Determinants of Health   Financial Resource Strain:   . Difficulty of Paying Living Expenses: Not on  file  Food Insecurity:   . Worried About Charity fundraiser in the Last Year: Not on file  . Ran Out of Food in the Last Year: Not on file  Transportation Needs:   . Lack of Transportation (Medical): Not on file  . Lack of Transportation (Non-Medical): Not on file  Physical Activity:   . Days of Exercise per Week: Not on file  . Minutes of Exercise per Session: Not on file  Stress:   . Feeling of Stress : Not on file  Social Connections:   . Frequency of Communication with Friends and Family: Not on file  . Frequency of Social Gatherings with Friends and Family: Not on file  . Attends Religious Services: Not on file  . Active Member of Clubs or Organizations: Not on file  . Attends Archivist Meetings: Not on file  . Marital Status: Not on file  Intimate Partner Violence:   . Fear of Current or Ex-Partner: Not on file  . Emotionally Abused: Not on file  . Physically Abused: Not on file  . Sexually Abused: Not on file    Family History:    Family History  Problem Relation Age of Onset  . Heart attack Father        8s     ROS:  Please see the history of present illness.  All other ROS reviewed and negative.     Physical Exam/Data:   Vitals:   02/11/19 1504 02/11/19 1534 02/11/19 1603 02/11/19 1633  BP: (!) 99/57 121/80 (!) 120/107 103/77  Pulse:      Resp:      Temp:      TempSrc:      SpO2:      Weight:      Height:        Intake/Output Summary (Last 24 hours) at 02/11/2019 1710 Last data filed at 02/11/2019 1600 Gross per 24 hour  Intake 418.89 ml  Output --  Net 418.89 ml   Last 3 Weights 02/11/2019 02/09/2019 01/25/2019  Weight (lbs) 92 lb 9.5 oz 90 lb 9.6 oz 89 lb 12.8 oz  Weight (kg) 42 kg 41.096 kg 40.733 kg     Body mass index is 16.94 kg/m.  General:  appears chronically ill, in no acute distress HEENT: normal Lymph: no adenopathy Neck: no JVD Endocrine:  No thryomegaly Cardiac: Irregular, normal rate, no murmur Lungs:  clear to  auscultation bilaterally Abd: soft Ext: Trace edema Musculoskeletal:  No deformities Skin: warm and dry  Neuro:  no focal abnormalities noted Psych: Disoriented, does not respond appropriately to questions.  EKG:  No EKG ordered.  Will check EKG. Telemetry:  Telemetry was personally reviewed and demonstrates: Atrial fibrillation with rate in 90s  Relevant CV Studies:   Laboratory Data:  High Sensitivity Troponin:  No results for input(s): TROPONINIHS in the last 720 hours.  Chemistry Recent Labs  Lab 02/11/19 0845  NA 139  K 4.3  CL 106  CO2 24  GLUCOSE 122*  BUN 22  CREATININE 0.64  CALCIUM 9.3  GFRNONAA >60  GFRAA >60  ANIONGAP 9    No results for input(s): PROT, ALBUMIN, AST, ALT, ALKPHOS, BILITOT in the last 168 hours. Hematology Recent Labs  Lab 02/11/19 0845  WBC 9.2  RBC 4.28  HGB 14.7  HCT 43.7  MCV 102.1*  MCH 34.3*  MCHC 33.6  RDW 14.2  PLT 144*   BNPNo results for input(s): BNP, PROBNP in the last 168 hours.  DDimer No results for input(s): DDIMER in the last 168 hours.   Radiology/Studies:  DG Chest 1 View  Result Date: 02/11/2019 CLINICAL DATA:  Femur fracture EXAM: CHEST  1 VIEW COMPARISON:  10/03/2018 FINDINGS: Borderline heart size with aortic tortuosity distorted by leftward rotation. Anterior right fourth rib sclerotic focus also seen on prior, likely remote fracture or bone island in isolation. Remote left humeral neck fracture. Midthoracic compression fractures not primarily evaluated. There is no edema, consolidation, effusion, or pneumothorax. IMPRESSION: No evidence of acute cardiopulmonary disease. Electronically Signed   By: Monte Fantasia M.D.   On: 02/11/2019 09:46   DG HIP UNILAT WITH PELVIS 2-3 VIEWS RIGHT  Result Date: 02/11/2019 CLINICAL DATA:  Fall with femur fracture EXAM: DG HIP (WITH OR WITHOUT PELVIS) 2-3V RIGHT COMPARISON:  12/26/2018 FINDINGS: Right femoral neck fracture with moderate displacement, subcapital in  appearance. Left hip hemiarthroplasty and remote left obturator ring fracture. The bones are osteopenic. Prominent stool volume over the pelvis. IMPRESSION: Displaced right femoral neck fracture. Electronically Signed   By: Monte Fantasia M.D.   On: 02/11/2019 09:45     Assessment and Plan:    Preoperative evaluation: Prior to hip surgery.  Patient has severe dementia, unable to assess for anginal symptoms.  Very limited functional capacity, she is bedbound.  I agree with preoperative assessment prior to her hip surgery in August, that due to her age, advanced dementia, debility, and atrial fibrillation with RVR, she is high risk for an intermediate risk surgery.  In August, her family recognized she was high risk but wanted to proceed with surgery in an attempt to improve the pain she was in.  I was unable to reach her daughter [name] this evening, but I did speak with her son Pilar Plate for some time.  We discussed in detail that she is a high risk surgical candidate, but he states that he and his sisters feel the same as they did in August, that they would like to proceed with surgery to potentially improve her pain.   Atrial fibrillation with RVR: agree with IV diltiazem for rate control.  Appears controlled currently, would continue IV diltiazem perioperatively.  Not on anticoagulation due to fall risk    For questions or updates, please contact [place] Please consult [email or web address] for contact info under     Signed, Donato Heinz, MD  02/11/2019 5:10 PM

## 2019-02-11 NOTE — ED Provider Notes (Signed)
Medical screening examination/treatment/procedure(s) were conducted as a shared visit with non-physician practitioner(s) and myself.  I personally evaluated the patient during the encounter.      ED ECG REPORT   Date: 02/11/2019  Rate: 144  Rhythm: atrial fibrillation  QRS Axis: normal  Intervals: normal  ST/T Wave abnormalities: nonspecific ST/T changes  Conduction Disutrbances:none  Narrative Interpretation:   Old EKG Reviewed: changes noted  I have personally reviewed the EKG tracing and agree with the computerized printout as noted. EKG not crossing over into our system.  Patient has previous EKG from August which showed atrial fibrillation with a heart rate of 129.  Patient has known history of atrial fibrillation.  The EKG in August had prolonged QT.  Today's EKG does have some artifact with it.   Patient brought over from the [facility].  Patient is a DNR.  Patient had a fall yesterday.  There is concern for right leg injury possible right leg or hip fracture.  Patient in August underwent open reduction internal fixation of a left hip fracture.  Done by Dr. Erlinda Hong at [facility].  Apparently x-rays were done at [facility].  We will have to repeat the x-rays or not in our system.  Also patient is supposed to be on diltiazem a.m. 240 mg 24-hour capsule for the atrial fibrillation.  We may need to supplement that to control her atrial fib.  If patient does have a hip fracture as presumed on clinical exam since her shortening of the right leg.  And since she has an x-ray that supposedly showed fracture.  Patient would require admission by hospitalist.  Since she was operated on by orthopedics at [facility] in the past may need to be transferred there.   Fredia Sorrow, MD 02/11/19 [phone]

## 2019-02-11 NOTE — ED Notes (Signed)
Attempted to contact pt's family without success.

## 2019-02-12 ENCOUNTER — Inpatient Hospital Stay (HOSPITAL_COMMUNITY): Payer: Medicare HMO | Admitting: Certified Registered"

## 2019-02-12 ENCOUNTER — Encounter (HOSPITAL_COMMUNITY)
Admission: EM | Disposition: A | Discharge status: Skilled Nursing Facility | Payer: Self-pay | Source: Home / Self Care | Attending: Internal Medicine

## 2019-02-12 ENCOUNTER — Inpatient Hospital Stay (HOSPITAL_COMMUNITY): Payer: Medicare HMO

## 2019-02-12 HISTORY — PX: HIP ARTHROPLASTY: SHX981

## 2019-02-12 LAB — URINALYSIS, ROUTINE W REFLEX MICROSCOPIC
Bilirubin Urine: NEGATIVE
Glucose, UA: NEGATIVE mg/dL
Ketones, ur: NEGATIVE mg/dL
Nitrite: NEGATIVE
Protein, ur: 30 mg/dL — AB
Specific Gravity, Urine: 1.018 (ref 1.005–1.030)
pH: 6 (ref 5.0–8.0)

## 2019-02-12 LAB — BASIC METABOLIC PANEL
Anion gap: 11 (ref 5–15)
BUN: 18 mg/dL (ref 8–23)
CO2: 25 mmol/L (ref 22–32)
Calcium: 9.2 mg/dL (ref 8.9–10.3)
Chloride: 106 mmol/L (ref 98–111)
Creatinine, Ser: 0.77 mg/dL (ref 0.44–1.00)
GFR calc Af Amer: >60 mL/min (ref >60)
GFR calc non Af Amer: >60 mL/min (ref >60)
Glucose, Bld: 105 mg/dL — ABNORMAL HIGH (ref 70–99)
Potassium: 3.9 mmol/L (ref 3.5–5.1)
Sodium: 142 mmol/L (ref 135–145)

## 2019-02-12 LAB — CBC
HCT: 40.3 % (ref 36.0–46.0)
Hemoglobin: 13.9 g/dL (ref 12.0–15.0)
MCH: 34.1 pg — ABNORMAL HIGH (ref 26.0–34.0)
MCHC: 34.5 g/dL (ref 30.0–36.0)
MCV: 98.8 fL (ref 80.0–100.0)
Platelets: 133 10*3/uL — ABNORMAL LOW (ref 150–400)
RBC: 4.08 MIL/uL (ref 3.87–5.11)
RDW: 13.9 % (ref 11.5–15.5)
WBC: 7.7 10*3/uL (ref 4.0–10.5)
nRBC: 0 % (ref 0.0–0.2)

## 2019-02-12 LAB — SURGICAL PCR SCREEN
MRSA, PCR: POSITIVE — AB
Staphylococcus aureus: POSITIVE — AB

## 2019-02-12 LAB — MAGNESIUM: Magnesium: 2.2 mg/dL (ref 1.7–2.4)

## 2019-02-12 SURGERY — HEMIARTHROPLASTY, HIP, DIRECT ANTERIOR APPROACH, FOR FRACTURE
Anesthesia: General | Site: Hip | Laterality: Right

## 2019-02-12 MED ORDER — ONDANSETRON HCL 4 MG/2ML IJ SOLN
INTRAMUSCULAR | Status: DC | PRN
  Administered 2019-02-12: 4 mg via INTRAVENOUS

## 2019-02-12 MED ORDER — FENTANYL CITRATE (PF) 250 MCG/5ML IJ SOLN
INTRAMUSCULAR | Status: AC
  Filled 2019-02-12: qty 5

## 2019-02-12 MED ORDER — ROCURONIUM BROMIDE 50 MG/5ML IV SOSY
PREFILLED_SYRINGE | INTRAVENOUS | Status: DC | PRN
  Administered 2019-02-12: 50 mg via INTRAVENOUS

## 2019-02-12 MED ORDER — PHENOL 1.4 % MT LIQD: 1.0000 | OROMUCOSAL | Status: DC | PRN

## 2019-02-12 MED ORDER — SUGAMMADEX SODIUM 200 MG/2ML IV SOLN
INTRAVENOUS | Status: DC | PRN
  Administered 2019-02-12: 200 mg via INTRAVENOUS

## 2019-02-12 MED ORDER — CEFAZOLIN SODIUM-DEXTROSE 2-4 GM/100ML-% IV SOLN
2.0000 g | INTRAVENOUS | Status: AC
  Administered 2019-02-12: 2 g via INTRAVENOUS
  Filled 2019-02-12 (×2): qty 100

## 2019-02-12 MED ORDER — LACTATED RINGERS IV SOLN: INTRAVENOUS | Status: DC

## 2019-02-12 MED ORDER — PROPOFOL 10 MG/ML IV BOLUS
INTRAVENOUS | Status: DC | PRN
  Administered 2019-02-12: 40 mg via INTRAVENOUS

## 2019-02-12 MED ORDER — MUPIROCIN 2 % EX OINT
1.0000 "application " | TOPICAL_OINTMENT | Freq: Two times a day (BID) | CUTANEOUS | Status: DC
  Administered 2019-02-12 – 2019-02-14 (×5): 1 via NASAL

## 2019-02-12 MED ORDER — LIDOCAINE 2% (20 MG/ML) 5 ML SYRINGE
INTRAMUSCULAR | Status: DC | PRN
  Administered 2019-02-12: 40 mg via INTRAVENOUS

## 2019-02-12 MED ORDER — CHLORHEXIDINE GLUCONATE CLOTH 2 % EX PADS
6.0000 | MEDICATED_PAD | Freq: Every day | CUTANEOUS | Status: DC
  Administered 2019-02-12 – 2019-02-14 (×3): 6 via TOPICAL

## 2019-02-12 MED ORDER — BUPIVACAINE HCL (PF) 0.25 % IJ SOLN
INTRAMUSCULAR | Status: DC | PRN
  Administered 2019-02-12: 12 mL

## 2019-02-12 MED ORDER — TRAMADOL HCL 50 MG PO TABS: 50.0000 mg | ORAL_TABLET | Freq: Four times a day (QID) | ORAL | Status: DC | PRN

## 2019-02-12 MED ORDER — FENTANYL CITRATE (PF) 250 MCG/5ML IJ SOLN
INTRAMUSCULAR | Status: DC | PRN
  Administered 2019-02-12 (×2): 50 ug via INTRAVENOUS

## 2019-02-12 MED ORDER — CHLORHEXIDINE GLUCONATE 4 % EX LIQD
CUTANEOUS | Status: AC
  Administered 2019-02-12: 4 via TOPICAL
  Filled 2019-02-12: qty 15

## 2019-02-12 MED ORDER — CHLORHEXIDINE GLUCONATE 4 % EX LIQD
60.0000 mL | Freq: Once | CUTANEOUS | Status: AC
  Filled 2019-02-12: qty 15

## 2019-02-12 MED ORDER — ENSURE PRE-SURGERY PO LIQD
296.0000 mL | Freq: Once | ORAL | Status: DC
  Filled 2019-02-12: qty 296

## 2019-02-12 MED ORDER — STERILE WATER FOR IRRIGATION IR SOLN
Status: DC | PRN
  Administered 2019-02-12: 1000 mL

## 2019-02-12 MED ORDER — SODIUM CHLORIDE 0.9 % IR SOLN
Status: DC | PRN
  Administered 2019-02-12: 3000 mL

## 2019-02-12 MED ORDER — METOCLOPRAMIDE HCL 5 MG PO TABS: 5.0000 mg | ORAL_TABLET | Freq: Three times a day (TID) | ORAL | Status: DC | PRN

## 2019-02-12 MED ORDER — MIDAZOLAM HCL 2 MG/2ML IJ SOLN
INTRAMUSCULAR | Status: AC
  Filled 2019-02-12: qty 2

## 2019-02-12 MED ORDER — BUPIVACAINE HCL (PF) 0.25 % IJ SOLN
INTRAMUSCULAR | Status: AC
  Filled 2019-02-12: qty 30

## 2019-02-12 MED ORDER — ASPIRIN EC 325 MG PO TBEC
325.0000 mg | DELAYED_RELEASE_TABLET | Freq: Every day | ORAL | Status: DC
  Filled 2019-02-12: qty 1

## 2019-02-12 MED ORDER — MENTHOL 3 MG MT LOZG: 1.0000 | LOZENGE | OROMUCOSAL | Status: DC | PRN

## 2019-02-12 MED ORDER — METOCLOPRAMIDE HCL 5 MG/ML IJ SOLN: 5.0000 mg | Freq: Three times a day (TID) | INTRAMUSCULAR | Status: DC | PRN

## 2019-02-12 MED ORDER — 0.9 % SODIUM CHLORIDE (POUR BTL) OPTIME
TOPICAL | Status: DC | PRN
  Administered 2019-02-12: 1000 mL

## 2019-02-12 MED ORDER — DOCUSATE SODIUM 100 MG PO CAPS
100.0000 mg | ORAL_CAPSULE | Freq: Two times a day (BID) | ORAL | Status: DC
  Administered 2019-02-12: 100 mg via ORAL
  Filled 2019-02-12 (×3): qty 1

## 2019-02-12 MED ORDER — PHENYLEPHRINE HCL-NACL 10-0.9 MG/250ML-% IV SOLN
INTRAVENOUS | Status: DC | PRN
  Administered 2019-02-12: 60 ug/min via INTRAVENOUS

## 2019-02-12 MED ORDER — POVIDONE-IODINE 10 % EX SWAB: 2.0000 "application " | Freq: Once | CUTANEOUS | Status: DC

## 2019-02-12 SURGICAL SUPPLY — 68 items
BENZOIN TINCTURE PRP APPL 2/3 (GAUZE/BANDAGES/DRESSINGS) ×2 IMPLANT
BIPOLAR PROS AML 45 (Hips) ×2 IMPLANT
BLADE CLIPPER SURG (BLADE) IMPLANT
BLADE SAW SGTL 73X25 THK (BLADE) ×2 IMPLANT
BRUSH FEMORAL CANAL (MISCELLANEOUS) IMPLANT
COVER BACK TABLE 24X17X13 BIG (DRAPES) IMPLANT
COVER WAND RF STERILE (DRAPES) ×2 IMPLANT
DRAPE IMP U-DRAPE 54X76 (DRAPES) ×2 IMPLANT
DRAPE INCISE IOBAN 66X45 STRL (DRAPES) IMPLANT
DRAPE ORTHO SPLIT 77X108 STRL (DRAPES) ×2
DRAPE SURG ORHT 6 SPLT 77X108 (DRAPES) ×2 IMPLANT
DRAPE U-SHAPE 47X51 STRL (DRAPES) ×2 IMPLANT
DRSG MEPILEX BORDER 4X8 (GAUZE/BANDAGES/DRESSINGS) ×2 IMPLANT
DRSG PAD ABDOMINAL 8X10 ST (GAUZE/BANDAGES/DRESSINGS) ×4 IMPLANT
DURAPREP 26ML APPLICATOR (WOUND CARE) ×2 IMPLANT
ELECT CAUTERY BLADE 6.4 (BLADE) ×2 IMPLANT
ELECT REM PT RETURN 9FT ADLT (ELECTROSURGICAL) ×2
ELECTRODE REM PT RTRN 9FT ADLT (ELECTROSURGICAL) ×1 IMPLANT
EVACUATOR 1/8 PVC DRAIN (DRAIN) IMPLANT
FACESHIELD WRAPAROUND (MASK) ×4 IMPLANT
FACESHIELD WRAPAROUND OR TEAM (MASK) ×2 IMPLANT
GAUZE SPONGE 4X4 12PLY STRL (GAUZE/BANDAGES/DRESSINGS) ×2 IMPLANT
GAUZE SPONGE 4X4 16PLY XRAY LF (GAUZE/BANDAGES/DRESSINGS) ×1 IMPLANT
GAUZE XEROFORM 5X9 LF (GAUZE/BANDAGES/DRESSINGS) ×2 IMPLANT
GLOVE BIOGEL PI IND STRL 8 (GLOVE) ×2 IMPLANT
GLOVE BIOGEL PI INDICATOR 8 (GLOVE) ×2
GLOVE ORTHO TXT STRL SZ7.5 (GLOVE) ×4 IMPLANT
GOWN STRL REUS W/ TWL LRG LVL3 (GOWN DISPOSABLE) ×1 IMPLANT
GOWN STRL REUS W/ TWL XL LVL3 (GOWN DISPOSABLE) ×1 IMPLANT
GOWN STRL REUS W/TWL 2XL LVL3 (GOWN DISPOSABLE) ×2 IMPLANT
GOWN STRL REUS W/TWL LRG LVL3 (GOWN DISPOSABLE) ×1
GOWN STRL REUS W/TWL XL LVL3 (GOWN DISPOSABLE) ×1
HANDPIECE INTERPULSE COAX TIP (DISPOSABLE)
HEAD BIPOLAR PROS AML 45 (Hips) IMPLANT
HEAD FEM STD 28X+1.5 STRL (Hips) ×1 IMPLANT
IMMOBILIZER KNEE 22 UNIV (SOFTGOODS) IMPLANT
KIT BASIN OR (CUSTOM PROCEDURE TRAY) ×2 IMPLANT
KIT TURNOVER KIT B (KITS) ×2 IMPLANT
MANIFOLD NEPTUNE II (INSTRUMENTS) ×2 IMPLANT
NDL HYPO 25GX1X1/2 BEV (NEEDLE) ×1 IMPLANT
NDL SUT 2 .5 CRC MAYO 1.732X (NEEDLE) ×1 IMPLANT
NEEDLE HYPO 25GX1X1/2 BEV (NEEDLE) ×2 IMPLANT
NEEDLE MAYO TAPER (NEEDLE) ×1
NS IRRIG 1000ML POUR BTL (IV SOLUTION) ×2 IMPLANT
PACK TOTAL JOINT (CUSTOM PROCEDURE TRAY) ×2 IMPLANT
PACK UNIVERSAL I (CUSTOM PROCEDURE TRAY) ×2 IMPLANT
PAD ARMBOARD 7.5X6 YLW CONV (MISCELLANEOUS) ×4 IMPLANT
PIN STEINMAN 3/16 (PIN) ×2 IMPLANT
SET HNDPC FAN SPRY TIP SCT (DISPOSABLE) IMPLANT
SPONGE LAP 4X18 RFD (DISPOSABLE) ×4 IMPLANT
STAPLER VISISTAT 35W (STAPLE) ×2 IMPLANT
STEM SUMMIT BASIC PRESSFIT SZ3 (Hips) IMPLANT
STRIP CLOSURE SKIN 1/2X4 (GAUZE/BANDAGES/DRESSINGS) ×4 IMPLANT
SUCTION FRAZIER HANDLE 10FR (MISCELLANEOUS) ×1
SUCTION TUBE FRAZIER 10FR DISP (MISCELLANEOUS) ×1 IMPLANT
SUMMIT BASIC PRESSFIT SZ3 (Hips) ×2 IMPLANT
SUT ETHIBOND NAB CT1 #1 30IN (SUTURE) ×6 IMPLANT
SUT TICRON (SUTURE) ×4 IMPLANT
SUT VIC AB 2-0 CT1 27 (SUTURE) ×3
SUT VIC AB 2-0 CT1 TAPERPNT 27 (SUTURE) ×3 IMPLANT
SUT VICRYL 0 TIES 12 18 (SUTURE) ×2 IMPLANT
SYR CONTROL 10ML LL (SYRINGE) ×2 IMPLANT
TAPE CLOTH SURG 4X10 WHT LF (GAUZE/BANDAGES/DRESSINGS) ×1 IMPLANT
TOWEL GREEN STERILE (TOWEL DISPOSABLE) ×2 IMPLANT
TOWEL GREEN STERILE FF (TOWEL DISPOSABLE) ×2 IMPLANT
TOWER CARTRIDGE SMART MIX (DISPOSABLE) IMPLANT
TRAY FOLEY MTR SLVR 16FR STAT (SET/KITS/TRAYS/PACK) IMPLANT
WATER STERILE IRR 1000ML POUR (IV SOLUTION) ×8 IMPLANT

## 2019-02-12 NOTE — Consult Note (Signed)
Reason for Consult:right displaced femoral neck fracture after fall Referring Physician: Dr. Domenic Polite  Casey Wood is an 85 y.o. female.  HPI: 85 year old female with dementia and atrial fib with rapid ventricular rate is been a patient of our practice previous left hip hemiarthroplasty August 2024 left femoral neck fracture was at the [facility] when she suffered a fall with right angulated femoral neck fracture.  Fall was noticed about 3 days ago.  She is noted to have increased pain with mobility and x-rays at [facility] demonstrated angulated femoral neck fracture of the right hip.  No injury to the left hip hemiarthroplasty previously done.  Patient was noted to have a rapid ventricular rate with atrial fib and heart rate 150.  This is been controlled with Cardizem drip.  Patient has been seen by hospitalist and also by cardiology for consultation.  Patient's daughter is her patient and I called her yesterday and discussed plan hemiarthroplasty procedure and then transfer back to SNF in a couple days after surgery if she remains stable and hospitalist feels she is ready for transfer.  Past Medical History:  Diagnosis Date  . Alcohol abuse 02/07/2018  . Atrial fibrillation ([place])   . CHF (congestive heart failure) ([place]) 02/07/2018  . Dementia ([place]) 02/07/2018  . Dysphagia   . GERD (gastroesophageal reflux disease)   . High cholesterol   . Hypertension   . Hypokalemia   . Lung cancer ([place])   . Psychosis ([place]) 04/08/2018  . Seizures ([place]) 02/07/2018  . Thyroid disease   . TIA (transient ischemic attack)     Past Surgical History:  Procedure Laterality Date  . cardiac implants and grafts    . LOBECTOMY    . REPLACEMENT TOTAL KNEE Bilateral   . TOTAL HIP ARTHROPLASTY Left 10/05/2018   Procedure: HEMI HIP ARTHROPLASTY ANTERIOR APPROACH;  Surgeon: Leandrew Koyanagi, MD;  Location: [facility];  Service: Orthopedics;  Laterality: Left;    Family History  Problem Relation  Age of Onset  . Heart attack Father        85s    Social History:  reports that she has never smoked. She has never used smokeless tobacco. She reports previous alcohol use. She reports that she does not use drugs.  Allergies:  Allergies  Allergen Reactions  . Levofloxacin     unknown    Medications: I have reviewed the patient's current medications.  Results for orders placed or performed during the hospital encounter of 02/11/19 (from the past 48 hour(s))  CBC with Differential     Status: Abnormal   Collection Time: 02/11/19  8:45 AM  Result Value Ref Range   WBC 9.2 4.0 - 10.5 K/uL   RBC 4.28 3.87 - 5.11 MIL/uL   Hemoglobin 14.7 12.0 - 15.0 g/dL   HCT 43.7 36.0 - 46.0 %   MCV 102.1 (H) 80.0 - 100.0 fL   MCH 34.3 (H) 26.0 - 34.0 pg   MCHC 33.6 30.0 - 36.0 g/dL   RDW 14.2 11.5 - 15.5 %   Platelets 144 (L) 150 - 400 K/uL   nRBC 0.0 0.0 - 0.2 %   Neutrophils Relative % 71 %   Neutro Abs 6.6 1.7 - 7.7 K/uL   Lymphocytes Relative 15 %   Lymphs Abs 1.4 0.7 - 4.0 K/uL   Monocytes Relative 11 %   Monocytes Absolute 1.0 0.1 - 1.0 K/uL   Eosinophils Relative 1 %   Eosinophils Absolute 0.1 0.0 -  0.5 K/uL   Basophils Relative 1 %   Basophils Absolute 0.1 0.0 - 0.1 K/uL   Immature Granulocytes 1 %   Abs Immature Granulocytes 0.06 0.00 - 0.07 K/uL    Comment: Performed at [facility], [address], [place], [place] [address]  Basic metabolic panel     Status: Abnormal   Collection Time: 02/11/19  8:45 AM  Result Value Ref Range   Sodium 139 135 - 145 mmol/L   Potassium 4.3 3.5 - 5.1 mmol/L   Chloride 106 98 - 111 mmol/L   CO2 24 22 - 32 mmol/L   Glucose, Bld 122 (H) 70 - 99 mg/dL   BUN 22 8 - 23 mg/dL   Creatinine, Ser 0.64 0.44 - 1.00 mg/dL   Calcium 9.3 8.9 - 10.3 mg/dL   GFR calc non Af Amer >60 >60 mL/min   GFR calc Af Amer >60 >60 mL/min   Anion gap 9 5 - 15    Comment: Performed at [facility], [address], [place], [place] [address]  SARS CORONAVIRUS 2 (TAT  6-24 HRS) Nasopharyngeal Nasopharyngeal Swab     Status: None   Collection Time: 02/11/19  8:48 AM   Specimen: Nasopharyngeal Swab  Result Value Ref Range   SARS Coronavirus 2 NEGATIVE NEGATIVE    Comment: (NOTE) SARS-CoV-2 target nucleic acids are NOT DETECTED. The SARS-CoV-2 RNA is generally detectable in upper and lower respiratory specimens during the acute phase of infection. Negative results do not preclude SARS-CoV-2 infection, do not rule out co-infections with other pathogens, and should not be used as the sole basis for treatment or other patient management decisions. Negative results must be combined with clinical observations, patient history, and epidemiological information. The expected result is Negative. Fact Sheet for Patients: SugarRoll.be Fact Sheet for Healthcare Providers: https://www.woods-mathews.com/ This test is not yet approved or cleared by the [place] FDA and  has been authorized for detection and/or diagnosis of SARS-CoV-2 by FDA under an Emergency Use Authorization (EUA). This EUA will remain  in effect (meaning this test can be used) for the duration of the COVID-19 declaration under Section 56 4(b)(1) of the Act, 21 U.S.C. section 360bbb-3(b)(1), unless the authorization is terminated or revoked sooner. Performed at [facility], [place] [address], [place], [place] [address]   Surgical PCR screen     Status: Abnormal   Collection Time: 02/11/19 11:51 PM   Specimen: Nasal Mucosa; Nasal Swab  Result Value Ref Range   MRSA, PCR POSITIVE (A) NEGATIVE    Comment: CRITICAL RESULT CALLED TO, READ BACK BY AND VERIFIED WITH: RN, D. Carman Ching [phone] @0508  THANEY    Staphylococcus aureus POSITIVE (A) NEGATIVE    Comment: CRITICAL RESULT CALLED TO, READ BACK BY AND VERIFIED WITH: RN, D. GREESON [id number] @0508  THANEY Performed at [facility] [address], [place], [place] [address]   Magnesium      Status: None   Collection Time: 02/12/19  3:32 AM  Result Value Ref Range   Magnesium 2.2 1.7 - 2.4 mg/dL    Comment: Performed at [facility] [address], [place], [place] [address]  Basic metabolic panel     Status: Abnormal   Collection Time: 02/12/19  3:32 AM  Result Value Ref Range   Sodium 142 135 - 145 mmol/L   Potassium 3.9 3.5 - 5.1 mmol/L   Chloride 106 98 - 111 mmol/L   CO2 25 22 - 32 mmol/L   Glucose,  Bld 105 (H) 70 - 99 mg/dL   BUN 18 8 - 23 mg/dL   Creatinine, Ser 0.77 0.44 - 1.00 mg/dL   Calcium 9.2 8.9 - 10.3 mg/dL   GFR calc non Af Amer >60 >60 mL/min   GFR calc Af Amer >60 >60 mL/min   Anion gap 11 5 - 15    Comment: Performed at [facility] [address] [place], [place] [address]  CBC     Status: Abnormal   Collection Time: 02/12/19  3:32 AM  Result Value Ref Range   WBC 7.7 4.0 - 10.5 K/uL   RBC 4.08 3.87 - 5.11 MIL/uL   Hemoglobin 13.9 12.0 - 15.0 g/dL   HCT 40.3 36.0 - 46.0 %   MCV 98.8 80.0 - 100.0 fL   MCH 34.1 (H) 26.0 - 34.0 pg   MCHC 34.5 30.0 - 36.0 g/dL   RDW 13.9 11.5 - 15.5 %   Platelets 133 (L) 150 - 400 K/uL   nRBC 0.0 0.0 - 0.2 %    Comment: Performed at [facility], [place] [address], [place], [place] [address]    DG Chest 1 View  Result Date: 02/11/2019 CLINICAL DATA:  Femur fracture EXAM: CHEST  1 VIEW COMPARISON:  10/03/2018 FINDINGS: Borderline heart size with aortic tortuosity distorted by leftward rotation. Anterior right fourth rib sclerotic focus also seen on prior, likely remote fracture or bone island in isolation. Remote left humeral neck fracture. Midthoracic compression fractures not primarily evaluated. There is no edema, consolidation, effusion, or pneumothorax. IMPRESSION: No evidence of acute cardiopulmonary disease. Electronically Signed   By: Monte Fantasia M.D.   On: 02/11/2019 09:46   DG HIP UNILAT WITH PELVIS 2-3 VIEWS RIGHT  Result Date: 02/11/2019 CLINICAL DATA:  Fall with femur fracture  EXAM: DG HIP (WITH OR WITHOUT PELVIS) 2-3V RIGHT COMPARISON:  12/26/2018 FINDINGS: Right femoral neck fracture with moderate displacement, subcapital in appearance. Left hip hemiarthroplasty and remote left obturator ring fracture. The bones are osteopenic. Prominent stool volume over the pelvis. IMPRESSION: Displaced right femoral neck fracture. Electronically Signed   By: Monte Fantasia M.D.   On: 02/11/2019 09:45    ROS positive for dementia with psychosis.  Chart review done only due to patient's mental confusion.  Positive for atrial for with history of heart failure GERD hypertension hypokalemia seizures lung cancer TIA. Blood pressure 122/71, pulse 87, temperature 98.4 F (36.9 C), temperature source Oral, resp. rate 15, height 5\' 2"  (1.575 m), weight 42.9 kg, SpO2 99 %. Physical Exam  Constitutional: She appears well-developed and well-nourished.  Eyes: Pupils are equal, round, and reactive to light. Conjunctivae are normal.  Neck: No tracheal deviation present. No thyromegaly present.  Cardiovascular:  Atrial fib rate with a ventricular rate of 90.  Irregularly irregular.  Respiratory: Effort normal. No respiratory distress. She has no wheezes.  GI: Soft. She exhibits no distension. There is no abdominal tenderness.  Musculoskeletal:     Cervical back: Normal range of motion.     Comments: Right hip shortened and actually rotated.  Painful with motion.  Distal pulse intact.  Neurological:  Confused disoriented.  Pleasant talkative cooperative.  Skin: Skin is warm and dry.  Psychiatric:  Patient with dementia.    Assessment/Plan: Discussed operative plan with daughter for right hip hemiarthroplasty posterior approach.  She should be able to weight-bear the following day with physical therapy and then transfer back to skilled facility when she is medically stable.  Cardizem  drip until postop and then she can be converted later to oral diltiazem.  Risks of surgery discussed due to  multiple medical problems and advanced age.  Daughter wishes to proceed. My cell [phone]  Marybelle Killings 02/12/2019, 9:15 AM

## 2019-02-12 NOTE — Progress Notes (Signed)
Patient was I and O cathed on day shift, no spontaneous void noted thus far.  Bladder scan complete and was 372mL.  [facility] paged with this information.

## 2019-02-12 NOTE — Op Note (Signed)
Preop diagnosis: Right angulated displaced femoral neck fracture  Postop diagnosis same  Procedure: Right hip press-fit hemiarthroplasty.  Surgeon: Rodell Perna, MD  Assistant: Benjiman Core, PA-C medically necessary and present for the entire procedure  Anesthesia: General  EBL: 100 cc  Implants:Depuy [place] basic press-fit size 3 stem.  45 mm ball +1.5 mm neck length.  Complications: None.  Procedure: After induction of anesthesia lateral positioning with general anesthesia orotracheal intubation Ancef prophylaxis patient had an axillary roll placed with the right hip up in 1015 drape was applied.  Standard prepping and draping was performed sterile skin marker Betadine Steri-Drape x2 and impervious stockinette with [place].  Timeout procedure completed.  Posterior approach was made turn retractor placed gluteus maximus split in line with fibers piriformis tagged cut released posterior capsule was opened.  Neck was cut 1 fingerbreadth above the lesser trochanter.  Diona Foley was removed with a corkscrew power and then switched to the handle to remove the head.  Sized to 45 good suction fit.  Canal preparation progressing up to a size 3 which gave excellent canal fit.  Permanent stem was placed +1.5 ball based on trials with flexion 90 internal rotation 80 degrees good stability.  Irrigation with saline solution socket was clean.  Permanent hip bipolar was assembled and assembled implanted sciatic nerve protected while the hip was reduced.  Piriformis repaired with free needle to the gluteus medius tendon tensor fascia and gluteus maximus fascia was closed with interrupted Tycron 2-0 Vicryl subtendinous tissue skin staple closure Marcaine infiltration 12 cc postop 4 x 4's tape and transferred recovery in stable condition.

## 2019-02-12 NOTE — H&P (View-Only) (Signed)
Reason for Consult:right displaced femoral neck fracture after fall Referring Physician: Dr. Domenic Polite  Casey Wood is an 85 y.o. female.  HPI: 85 year old female with dementia and atrial fib with rapid ventricular rate is been a patient of our practice previous left hip hemiarthroplasty August 2024 left femoral neck fracture was at the [facility] when she suffered a fall with right angulated femoral neck fracture.  Fall was noticed about 3 days ago.  She is noted to have increased pain with mobility and x-rays at [facility] demonstrated angulated femoral neck fracture of the right hip.  No injury to the left hip hemiarthroplasty previously done.  Patient was noted to have a rapid ventricular rate with atrial fib and heart rate 150.  This is been controlled with Cardizem drip.  Patient has been seen by hospitalist and also by cardiology for consultation.  Patient's daughter is her patient and I called her yesterday and discussed plan hemiarthroplasty procedure and then transfer back to SNF in a couple days after surgery if she remains stable and hospitalist feels she is ready for transfer.  Past Medical History:  Diagnosis Date  . Alcohol abuse 02/07/2018  . Atrial fibrillation ([place])   . CHF (congestive heart failure) ([place]) 02/07/2018  . Dementia ([place]) 02/07/2018  . Dysphagia   . GERD (gastroesophageal reflux disease)   . High cholesterol   . Hypertension   . Hypokalemia   . Lung cancer ([place])   . Psychosis ([place]) 04/08/2018  . Seizures ([place]) 02/07/2018  . Thyroid disease   . TIA (transient ischemic attack)     Past Surgical History:  Procedure Laterality Date  . cardiac implants and grafts    . LOBECTOMY    . REPLACEMENT TOTAL KNEE Bilateral   . TOTAL HIP ARTHROPLASTY Left 10/05/2018   Procedure: HEMI HIP ARTHROPLASTY ANTERIOR APPROACH;  Surgeon: Leandrew Koyanagi, MD;  Location: [facility];  Service: Orthopedics;  Laterality: Left;    Family History  Problem Relation  Age of Onset  . Heart attack Father        70s    Social History:  reports that she has never smoked. She has never used smokeless tobacco. She reports previous alcohol use. She reports that she does not use drugs.  Allergies:  Allergies  Allergen Reactions  . Levofloxacin     unknown    Medications: I have reviewed the patient's current medications.  Results for orders placed or performed during the hospital encounter of 02/11/19 (from the past 48 hour(s))  CBC with Differential     Status: Abnormal   Collection Time: 02/11/19  8:45 AM  Result Value Ref Range   WBC 9.2 4.0 - 10.5 K/uL   RBC 4.28 3.87 - 5.11 MIL/uL   Hemoglobin 14.7 12.0 - 15.0 g/dL   HCT 43.7 36.0 - 46.0 %   MCV 102.1 (H) 80.0 - 100.0 fL   MCH 34.3 (H) 26.0 - 34.0 pg   MCHC 33.6 30.0 - 36.0 g/dL   RDW 14.2 11.5 - 15.5 %   Platelets 144 (L) 150 - 400 K/uL   nRBC 0.0 0.0 - 0.2 %   Neutrophils Relative % 71 %   Neutro Abs 6.6 1.7 - 7.7 K/uL   Lymphocytes Relative 15 %   Lymphs Abs 1.4 0.7 - 4.0 K/uL   Monocytes Relative 11 %   Monocytes Absolute 1.0 0.1 - 1.0 K/uL   Eosinophils Relative 1 %   Eosinophils Absolute 0.1 0.0 -  0.5 K/uL   Basophils Relative 1 %   Basophils Absolute 0.1 0.0 - 0.1 K/uL   Immature Granulocytes 1 %   Abs Immature Granulocytes 0.06 0.00 - 0.07 K/uL    Comment: Performed at [facility], [address] [place], [place] [address]  Basic metabolic panel     Status: Abnormal   Collection Time: 02/11/19  8:45 AM  Result Value Ref Range   Sodium 139 135 - 145 mmol/L   Potassium 4.3 3.5 - 5.1 mmol/L   Chloride 106 98 - 111 mmol/L   CO2 24 22 - 32 mmol/L   Glucose, Bld 122 (H) 70 - 99 mg/dL   BUN 22 8 - 23 mg/dL   Creatinine, Ser 0.64 0.44 - 1.00 mg/dL   Calcium 9.3 8.9 - 10.3 mg/dL   GFR calc non Af Amer >60 >60 mL/min   GFR calc Af Amer >60 >60 mL/min   Anion gap 9 5 - 15    Comment: Performed at [facility], [address], [place], [place] [address]  SARS CORONAVIRUS 2 (TAT  6-24 HRS) Nasopharyngeal Nasopharyngeal Swab     Status: None   Collection Time: 02/11/19  8:48 AM   Specimen: Nasopharyngeal Swab  Result Value Ref Range   SARS Coronavirus 2 NEGATIVE NEGATIVE    Comment: (NOTE) SARS-CoV-2 target nucleic acids are NOT DETECTED. The SARS-CoV-2 RNA is generally detectable in upper and lower respiratory specimens during the acute phase of infection. Negative results do not preclude SARS-CoV-2 infection, do not rule out co-infections with other pathogens, and should not be used as the sole basis for treatment or other patient management decisions. Negative results must be combined with clinical observations, patient history, and epidemiological information. The expected result is Negative. Fact Sheet for Patients: SugarRoll.be Fact Sheet for Healthcare Providers: https://www.woods-mathews.com/ This test is not yet approved or cleared by the [place] FDA and  has been authorized for detection and/or diagnosis of SARS-CoV-2 by FDA under an Emergency Use Authorization (EUA). This EUA will remain  in effect (meaning this test can be used) for the duration of the COVID-19 declaration under Section 56 4(b)(1) of the Act, 21 U.S.C. section 360bbb-3(b)(1), unless the authorization is terminated or revoked sooner. Performed at [facility], [place] [address], [place], [place] [address]   Surgical PCR screen     Status: Abnormal   Collection Time: 02/11/19 11:51 PM   Specimen: Nasal Mucosa; Nasal Swab  Result Value Ref Range   MRSA, PCR POSITIVE (A) NEGATIVE    Comment: CRITICAL RESULT CALLED TO, READ BACK BY AND VERIFIED WITH: RN, D. Carman Ching [id number] @0508  THANEY    Staphylococcus aureus POSITIVE (A) NEGATIVE    Comment: CRITICAL RESULT CALLED TO, READ BACK BY AND VERIFIED WITH: RN, D. GREESON [id number] @0508  Delight Stare Performed at [facility] [address], [place], [place] [address]   Magnesium      Status: None   Collection Time: 02/12/19  3:32 AM  Result Value Ref Range   Magnesium 2.2 1.7 - 2.4 mg/dL    Comment: Performed at [facility] [address] [place],  [address]  Basic metabolic panel     Status: Abnormal   Collection Time: 02/12/19  3:32 AM  Result Value Ref Range   Sodium 142 135 - 145 mmol/L   Potassium 3.9 3.5 - 5.1 mmol/L   Chloride 106 98 - 111 mmol/L   CO2 25 22 - 32 mmol/L   Glucose,  Bld 105 (H) 70 - 99 mg/dL   BUN 18 8 - 23 mg/dL   Creatinine, Ser 0.77 0.44 - 1.00 mg/dL   Calcium 9.2 8.9 - 10.3 mg/dL   GFR calc non Af Amer >60 >60 mL/min   GFR calc Af Amer >60 >60 mL/min   Anion gap 11 5 - 15    Comment: Performed at [facility] [address] [place], [place] [address]  CBC     Status: Abnormal   Collection Time: 02/12/19  3:32 AM  Result Value Ref Range   WBC 7.7 4.0 - 10.5 K/uL   RBC 4.08 3.87 - 5.11 MIL/uL   Hemoglobin 13.9 12.0 - 15.0 g/dL   HCT 40.3 36.0 - 46.0 %   MCV 98.8 80.0 - 100.0 fL   MCH 34.1 (H) 26.0 - 34.0 pg   MCHC 34.5 30.0 - 36.0 g/dL   RDW 13.9 11.5 - 15.5 %   Platelets 133 (L) 150 - 400 K/uL   nRBC 0.0 0.0 - 0.2 %    Comment: Performed at [facility], [place] [address], [place], [place] [address]    DG Chest 1 View  Result Date: 02/11/2019 CLINICAL DATA:  Femur fracture EXAM: CHEST  1 VIEW COMPARISON:  10/03/2018 FINDINGS: Borderline heart size with aortic tortuosity distorted by leftward rotation. Anterior right fourth rib sclerotic focus also seen on prior, likely remote fracture or bone island in isolation. Remote left humeral neck fracture. Midthoracic compression fractures not primarily evaluated. There is no edema, consolidation, effusion, or pneumothorax. IMPRESSION: No evidence of acute cardiopulmonary disease. Electronically Signed   By: Monte Fantasia M.D.   On: 02/11/2019 09:46   DG HIP UNILAT WITH PELVIS 2-3 VIEWS RIGHT  Result Date: 02/11/2019 CLINICAL DATA:  Fall with femur fracture  EXAM: DG HIP (WITH OR WITHOUT PELVIS) 2-3V RIGHT COMPARISON:  12/26/2018 FINDINGS: Right femoral neck fracture with moderate displacement, subcapital in appearance. Left hip hemiarthroplasty and remote left obturator ring fracture. The bones are osteopenic. Prominent stool volume over the pelvis. IMPRESSION: Displaced right femoral neck fracture. Electronically Signed   By: Monte Fantasia M.D.   On: 02/11/2019 09:45    ROS positive for dementia with psychosis.  Chart review done only due to patient's mental confusion.  Positive for atrial for with history of heart failure GERD hypertension hypokalemia seizures lung cancer TIA. Blood pressure 122/71, pulse 87, temperature 98.4 F (36.9 C), temperature source Oral, resp. rate 15, height 5\' 2"  (1.575 m), weight 42.9 kg, SpO2 99 %. Physical Exam  Constitutional: She appears well-developed and well-nourished.  Eyes: Pupils are equal, round, and reactive to light. Conjunctivae are normal.  Neck: No tracheal deviation present. No thyromegaly present.  Cardiovascular:  Atrial fib rate with a ventricular rate of 90.  Irregularly irregular.  Respiratory: Effort normal. No respiratory distress. She has no wheezes.  GI: Soft. She exhibits no distension. There is no abdominal tenderness.  Musculoskeletal:     Cervical back: Normal range of motion.     Comments: Right hip shortened and actually rotated.  Painful with motion.  Distal pulse intact.  Neurological:  Confused disoriented.  Pleasant talkative cooperative.  Skin: Skin is warm and dry.  Psychiatric:  Patient with dementia.    Assessment/Plan: Discussed operative plan with daughter for right hip hemiarthroplasty posterior approach.  She should be able to weight-bear the following day with physical therapy and then transfer back to skilled facility when she is medically stable.  Cardizem  drip until postop and then she can be converted later to oral diltiazem.  Risks of surgery discussed due to  multiple medical problems and advanced age.  Daughter wishes to proceed. My cell [phone]  Marybelle Killings 02/12/2019, 9:15 AM

## 2019-02-12 NOTE — Progress Notes (Signed)
PROGRESS NOTE    Casey Wood  KNL:[id number] DOB: [birth year] DOA: 02/11/2019 PCP: Hennie Duos, MD  Brief Narrative:Casey Wood is a 85 y.o. female with medical history significant for atrial fibrillation not on anticoagulation, recent left femoral neck fracture with arthroplasty on 10/05/2018 dementia with psychosis, CHF, seizures, hypothyroidism, and hypertension who was brought from her SNF due to a fall that was noticed approximately 3 days ago.  At that time her right foot was wedged underneath her bed.  She was noted to have a hip x-ray on 12/19 demonstrating a displaced subcapital right femoral fracture.   sent to ED today for further evaluation.  She cannot provide any history herself as she has advanced dementia  -She was transferred from [facility] emergency room, was also noted to be in rapid A. fib in the ED and was started on a Cardizem drip -Currently awaiting surgery, cardiology was also consulted yesterday  Assessment & Plan:  Right displaced femur neck fracture -History of left hip hemiarthroplasty in August this year -Orthopedics Dr. Lorin Mercy consulting -Plan for surgery later today  Atrial fibrillation with RVR -Heart rate improved on Cardizem drip, she spit up her Cardizem p.o. in the ED yesterday hence remained on the drip -Plan to continue IV diltiazem perioperatively, appreciate cardiology input -She is considered high risk for intermediate risk surgery, unable to assess symptoms given advanced dementia and extremely limited mobility prior to admission -Not on anticoagulation due to advanced age, frequent falls  Advanced dementia -Confused, total care, mostly bedbound at baseline  History of diastolic CHF -Recent echo in our system, clinically euvolemic  History of seizures -Not on antiepileptic agents any longer per med rec  Hypothyroidism -Continue Synthroid  Mild chronic thrombocytopenia -Stable, monitor, add Lovenox postop  DVT  prophylaxis: Add Lovenox postop, monitor platelet count Code Status: DNR Family Communication: no family at bedside Disposition Plan: Will need SNF  Consultants:   Cardiology  Orthopedics Dr. Lorin Mercy   Procedures:   Antimicrobials:    Subjective: -Remains pleasantly confused, on Cardizem drip, wearing mittens  Objective: Vitals:   02/11/19 1952 02/12/19 0300 02/12/19 0451 02/12/19 0734  BP: 116/78  131/79 122/71  Pulse: 88  87   Resp: 18  15   Temp: 98 F (36.7 C)  98.4 F (36.9 C)   TempSrc: Oral  Oral   SpO2: 100% 100% 100% 99%  Weight:   42.9 kg   Height:        Intake/Output Summary (Last 24 hours) at 02/12/2019 1129 Last data filed at 02/12/2019 0300 Gross per 24 hour  Intake 556.39 ml  Output --  Net 556.39 ml   Filed Weights   02/11/19 0823 02/12/19 0451  Weight: 42 kg 42.9 kg    Examination:  General exam: Elderly frail female, pleasantly confused, no distress, wearing mittens Respiratory system: Distant breath sounds bases, otherwise clear  cardiovascular system: S1-S2, irregularly irregular rhythm  gastrointestinal system: Abdomen is nondistended, soft and nontender.Normal bowel sounds heard. Central nervous system: Alert and oriented. No focal neurological deficits. Extremities: No edema Skin: No rashes, lesions or ulcers Psychiatry: Unable to assess    Data Reviewed:   CBC: Recent Labs  Lab 02/11/19 0845 02/12/19 0332  WBC 9.2 7.7  NEUTROABS 6.6  --   HGB 14.7 13.9  HCT 43.7 40.3  MCV 102.1* 98.8  PLT 144* 790*   Basic Metabolic Panel: Recent Labs  Lab 02/11/19 0845 02/12/19 0332  NA 139 142  K 4.3 3.9  CL 106 106  CO2 24 25  GLUCOSE 122* 105*  BUN 22 18  CREATININE 0.64 0.77  CALCIUM 9.3 9.2  MG  --  2.2   GFR: Estimated Creatinine Clearance: 29.1 mL/min (by C-G formula based on SCr of 0.77 mg/dL). Liver Function Tests: No results for input(s): AST, ALT, ALKPHOS, BILITOT, PROT, ALBUMIN in the last 168 hours. No  results for input(s): LIPASE, AMYLASE in the last 168 hours. No results for input(s): AMMONIA in the last 168 hours. Coagulation Profile: No results for input(s): INR, PROTIME in the last 168 hours. Cardiac Enzymes: No results for input(s): CKTOTAL, CKMB, CKMBINDEX, TROPONINI in the last 168 hours. BNP (last 3 results) No results for input(s): PROBNP in the last 8760 hours. HbA1C: No results for input(s): HGBA1C in the last 72 hours. CBG: No results for input(s): GLUCAP in the last 168 hours. Lipid Profile: No results for input(s): CHOL, HDL, LDLCALC, TRIG, CHOLHDL, LDLDIRECT in the last 72 hours. Thyroid Function Tests: No results for input(s): TSH, T4TOTAL, FREET4, T3FREE, THYROIDAB in the last 72 hours. Anemia Panel: No results for input(s): VITAMINB12, FOLATE, FERRITIN, TIBC, IRON, RETICCTPCT in the last 72 hours. Urine analysis: No results found for: COLORURINE, APPEARANCEUR, LABSPEC, PHURINE, GLUCOSEU, HGBUR, BILIRUBINUR, KETONESUR, PROTEINUR, UROBILINOGEN, NITRITE, LEUKOCYTESUR Sepsis Labs: @LABRCNTIP (procalcitonin:4,lacticidven:4)  ) Recent Results (from the past 240 hour(s))  SARS CORONAVIRUS 2 (TAT 6-24 HRS)     Status: None   Collection Time: 02/03/19  3:00 PM  Result Value Ref Range Status   SARS Coronavirus 2 NEGATIVE NEGATIVE Final    Comment: (NOTE) SARS-CoV-2 target nucleic acids are NOT DETECTED. The SARS-CoV-2 RNA is generally detectable in upper and lower respiratory specimens during the acute phase of infection. Negative results do not preclude SARS-CoV-2 infection, do not rule out co-infections with other pathogens, and should not be used as the sole basis for treatment or other patient management decisions. Negative results must be combined with clinical observations, patient history, and epidemiological information. The expected result is Negative. Fact Sheet for Patients: SugarRoll.be Fact Sheet for Healthcare  Providers: https://www.woods-mathews.com/ This test is not yet approved or cleared by the [place] FDA and  has been authorized for detection and/or diagnosis of SARS-CoV-2 by FDA under an Emergency Use Authorization (EUA). This EUA will remain  in effect (meaning this test can be used) for the duration of the COVID-19 declaration under Section 56 4(b)(1) of the Act, 21 U.S.C. section 360bbb-3(b)(1), unless the authorization is terminated or revoked sooner. Performed at [facility], [place] [address], [place], [place] [address]   Novel Coronavirus, NAA (Hosp order, Send-out to Ref Lab; TAT 18-24 hrs     Status: None   Collection Time: 02/07/19  5:28 PM  Result Value Ref Range Status   SARS-CoV-2, NAA NOT DETECTED NOT DETECTED Final    Comment: (NOTE) This nucleic acid amplification test was developed and its performance characteristics determined by [employer]. Nucleic acid amplification tests include PCR and TMA. This test has not been FDA cleared or approved. This test has been authorized by FDA under an Emergency Use Authorization (EUA). This test is only authorized for the duration of time the declaration that circumstances exist justifying the authorization of the emergency use of in vitro diagnostic tests for detection of SARS-CoV-2 virus and/or diagnosis of COVID-19 infection under section 564(b)(1) of the Act, 21 U.S.C. 093GHW-2(X) (1), unless the authorization is terminated or revoked sooner. When diagnostic testing is negative, the possibility of a false negative  result should be considered in the context of a patient's recent exposures and the presence of clinical signs and symptoms consistent with COVID-19. An individual without symptoms of COVID- 19 and who is not shedding SARS-CoV-2 vi rus would expect to have a negative (not detected) result in this assay. Performed At: [facility] [address] [place], [place]  [id number] Rush Farmer MD GN:[id number]    [facility]  Final    Comment: Performed at [facility], [place] [address] [place], [place] [address]  SARS CORONAVIRUS 2 (TAT 6-24 HRS) Nasopharyngeal Nasopharyngeal Swab     Status: None   Collection Time: 02/11/19  8:48 AM   Specimen: Nasopharyngeal Swab  Result Value Ref Range Status   SARS Coronavirus 2 NEGATIVE NEGATIVE Final    Comment: (NOTE) SARS-CoV-2 target nucleic acids are NOT DETECTED. The SARS-CoV-2 RNA is generally detectable in upper and lower respiratory specimens during the acute phase of infection. Negative results do not preclude SARS-CoV-2 infection, do not rule out co-infections with other pathogens, and should not be used as the sole basis for treatment or other patient management decisions. Negative results must be combined with clinical observations, patient history, and epidemiological information. The expected result is Negative. Fact Sheet for Patients: SugarRoll.be Fact Sheet for Healthcare Providers: https://www.woods-mathews.com/ This test is not yet approved or cleared by the [place] FDA and  has been authorized for detection and/or diagnosis of SARS-CoV-2 by FDA under an Emergency Use Authorization (EUA). This EUA will remain  in effect (meaning this test can be used) for the duration of the COVID-19 declaration under Section 56 4(b)(1) of the Act, 21 U.S.C. section 360bbb-3(b)(1), unless the authorization is terminated or revoked sooner. Performed at [facility], [place] [address] [place], [place] [address]   Surgical PCR screen     Status: Abnormal   Collection Time: 02/11/19 11:51 PM   Specimen: Nasal Mucosa; Nasal Swab  Result Value Ref Range Status   MRSA, PCR POSITIVE (A) NEGATIVE Final    Comment: CRITICAL RESULT CALLED TO, READ BACK BY AND VERIFIED WITH: RN, D. Carman Ching [id number] @0508  THANEY    Staphylococcus  aureus POSITIVE (A) NEGATIVE Final    Comment: CRITICAL RESULT CALLED TO, READ BACK BY AND VERIFIED WITH: RN, D. GREESON [id number] @0508  THANEY Performed at [facility] [address], [place],  [address]          Radiology Studies: DG Chest 1 View  Result Date: 02/11/2019 CLINICAL DATA:  Femur fracture EXAM: CHEST  1 VIEW COMPARISON:  10/03/2018 FINDINGS: Borderline heart size with aortic tortuosity distorted by leftward rotation. Anterior right fourth rib sclerotic focus also seen on prior, likely remote fracture or bone island in isolation. Remote left humeral neck fracture. Midthoracic compression fractures not primarily evaluated. There is no edema, consolidation, effusion, or pneumothorax. IMPRESSION: No evidence of acute cardiopulmonary disease. Electronically Signed   By: [place] Fantasia M.D.   On: 02/11/2019 09:46   DG HIP UNILAT WITH PELVIS 2-3 VIEWS RIGHT  Result Date: 02/11/2019 CLINICAL DATA:  Fall with femur fracture EXAM: DG HIP (WITH OR WITHOUT PELVIS) 2-3V RIGHT COMPARISON:  12/26/2018 FINDINGS: Right femoral neck fracture with moderate displacement, subcapital in appearance. Left hip hemiarthroplasty and remote left obturator ring fracture. The bones are osteopenic. Prominent stool volume over the pelvis. IMPRESSION: Displaced right femoral neck fracture. Electronically Signed   By: [place] Fantasia M.D.   On: 02/11/2019 09:45  Scheduled Meds: . ALPRAZolam  0.25 mg Oral QHS  . ascorbic acid  500 mg Oral Daily  . busPIRone  5 mg Oral BID  . chlorhexidine  60 mL Topical Once  . Chlorhexidine Gluconate Cloth  6 each Topical Q0600  . feeding supplement  296 mL Oral Once  . feeding supplement (PRO-STAT SUGAR FREE 64)  30 mL Oral BID BM  . levothyroxine  50 mcg Oral QAC breakfast  . mupirocin ointment  1 application Nasal BID  . povidone-iodine  2 application Topical Once  . QUEtiapine  12.5 mg Oral BID  . zinc sulfate  220 mg Oral Daily    Continuous Infusions: .  ceFAZolin (ANCEF) IV    . diltiazem (CARDIZEM) infusion 12.5 mg/hr (02/12/19 0748)     LOS: 1 day    Time spent: 46min   Domenic Polite, MD [facility]  02/12/2019, 11:29 AM

## 2019-02-12 NOTE — TOC Initial Note (Signed)
Transition of Care [facility]) - Initial/Assessment Note    Patient Details  Name: Casey Wood MRN: [id number] Date of Birth: May 14, 1922  Transition of Care [facility]) CM/SW Contact:    Eileen Stanford, LCSW Phone Number: 02/12/2019, 1:21 PM  Clinical Narrative:  Pt is disoriented x4. Pt lives at [facility]. Per pt's daughter plan would be for her to return at d/c.                   Expected Discharge Plan: Skilled Nursing Facility Barriers to Discharge: Insurance Authorization   Patient Goals and CMS Choice Patient states their goals for this hospitalization and ongoing recovery are:: "for pt to return to [facility]"      Expected Discharge Plan and Services Expected Discharge Plan: [place] In-house Referral: NA   Post Acute Care Choice: [place] Living arrangements for the past 2 months: [place]                                      Prior Living Arrangements/Services Living arrangements for the past 2 months: [place] Lives with:: Self Patient language and need for interpreter reviewed:: Yes Do you feel safe going back to the place where you live?: Yes      Need for Family Participation in Patient Care: Yes (Comment) Care giver support system in place?: Yes (comment)   Criminal Activity/Legal Involvement Pertinent to Current Situation/Hospitalization: No - Comment as needed  Activities of Daily Living      Permission Sought/Granted Permission sought to share information with : Family Supports    Share Information with NAME: [name]  Permission granted to share info w AGENCY: [place] granted to share info w Relationship: daughter     Emotional Assessment Appearance:: Appears stated age Attitude/Demeanor/Rapport: Engaged Affect (typically observed): Accepting, Appropriate, Calm Orientation: : (disoriented x4) Alcohol / Substance Use: Not Applicable Psych  Involvement: No (comment)  Admission diagnosis:  Hip fracture ([place]) [S72.009A] Atrial fibrillation with RVR ([place]) [I48.91] Closed fracture of right hip, initial encounter [facility]) [S72.001A] Patient Active Problem List   Diagnosis Date Noted  . Hip fracture ([place]) 02/11/2019  . Medicare annual wellness visit, subsequent 01/23/2019  . Unstageable pressure ulcer of sacral region ([place]) 11/11/2018  . Closed left humeral fracture 10/14/2018  . Goals of care, counseling/discussion   . Palliative care by specialist   . Left displaced femoral neck fracture ([place]) 10/03/2018  . Left ankle sprain 06/02/2018  . Chronic anxiety 05/11/2018  . Acquired hypothyroidism 04/25/2018  . Psychosis in elderly with behavioral disturbance ([place]) 04/08/2018  . Atrial fibrillation with RVR ([place]) 02/07/2018  . Advanced dementia ([place]) 02/07/2018  . Seizures ([place]) 02/07/2018  . Alcohol abuse 02/07/2018  . CHF (congestive heart failure) ([place]) 02/07/2018   PCP:  Hennie Duos, MD Pharmacy:   [facility] - [address] [place], [place] [address] Phone: [phone] Fax: [phone]  [facility] - [place], [place] - [address] [place] [address] Phone: [phone] Fax: [phone]     Social Determinants of Health (SDOH) Interventions    Readmission Risk Interventions No flowsheet data found.

## 2019-02-12 NOTE — Interval H&P Note (Signed)
History and Physical Interval Note:  02/12/2019 2:30 PM  Casey Wood  has presented today for surgery, with the diagnosis of right femoral neck fracture.  The various methods of treatment have been discussed with the patient and family. After consideration of risks, benefits and other options for treatment, the patient has consented to  Procedure(s): ARTHROPLASTY BIPOLAR HIP (HEMIARTHROPLASTY) (Right) as a surgical intervention.  The patient's history has been reviewed, patient examined, no change in status, stable for surgery.  I have reviewed the patient's chart and labs.  Questions were answered to the patient's satisfaction.     Marybelle Killings

## 2019-02-12 NOTE — Anesthesia Preprocedure Evaluation (Addendum)
Anesthesia Evaluation  Patient identified by MRN, date of birth, ID band Patient confused    Reviewed: Allergy & Precautions, NPO status , Patient's Chart, lab work & pertinent test results  History of Anesthesia Complications Negative for: history of anesthetic complications  Airway       Comment:  Unable to comply with exam  Dental  (+) Dental Advisory Given   Pulmonary   Lung cancer    Pulmonary exam normal        Cardiovascular hypertension, Pt. on medications +CHF  + dysrhythmias Atrial Fibrillation  Rhythm:Irregular Rate:Tachycardia   "Preoperative evaluation: Prior to hip surgery.  Patient has severe dementia, unable to assess for anginal symptoms.  Very limited functional capacity, she is bedbound.  I agree with preoperative assessment prior to her hip surgery in August, that due to her age, advanced dementia, debility, and atrial fibrillation with RVR, she is high risk for an intermediate risk surgery.  In August, her family recognized she was high risk but wanted to proceed with surgery in an attempt to improve the pain she was in.  I was unable to reach her daughter [name] this evening, but I did speak with her son Pilar Plate for some time.  We discussed in detail that she is a high risk surgical candidate, but he states that he and his sisters feel the same as they did in August, that they would like to proceed with surgery to potentially improve her pain.  Atrial fibrillation with RVR: agree with IV diltiazem for rate control.  Appears controlled currently, would continue IV diltiazem perioperatively.  Not on anticoagulation due to fall risk" - Dr. Gardiner Rhyme    Neuro/Psych Seizures -,  PSYCHIATRIC DISORDERS Anxiety Dementia  Dementia with psychosis TIA   GI/Hepatic GERD  ,(+)     substance abuse  alcohol use,   Endo/Other  Hypothyroidism   Renal/GU negative Renal ROS     Musculoskeletal negative musculoskeletal  ROS (+)   Abdominal   Peds  Hematology negative hematology ROS (+)   Anesthesia Other Findings Pressure ulcer of sacral region Covid negative 12/20  Reproductive/Obstetrics                          Anesthesia Physical Anesthesia Plan  ASA: IV  Anesthesia Plan: General   Post-op Pain Management:    Induction: Intravenous  PONV Risk Score and Plan: 3 and Treatment may vary due to age or medical condition and Ondansetron  Airway Management Planned: Oral ETT  Additional Equipment: None  Intra-op Plan:   Post-operative Plan: Extubation in OR  Informed Consent: I have reviewed the patients History and Physical, chart, labs and discussed the procedure including the risks, benefits and alternatives for the proposed anesthesia with the patient or authorized representative who has indicated his/her understanding and acceptance.   Patient has DNR.  Discussed DNR with power of attorney.   Dental advisory given  Plan Discussed with: CRNA and Anesthesiologist  Anesthesia Plan Comments: (Discussed consent and DNR status with daughter listed in chart via telephone. [place] for intubation for the procedure. Also agreeable t oIV fluids and vasopressors as necessary. Daughter states she does not want the patient to undergo chest compressions under any circumstance. )       Anesthesia Quick Evaluation

## 2019-02-12 NOTE — Anesthesia Procedure Notes (Signed)
Procedure Name: Intubation Date/Time: 02/12/2019 3:07 PM Performed by: Griffin Dakin, CRNA Pre-anesthesia Checklist: Patient identified, Emergency Drugs available, Suction available and Patient being monitored Patient Re-evaluated:Patient Re-evaluated prior to induction Oxygen Delivery Method: Circle system utilized Preoxygenation: Pre-oxygenation with 100% oxygen Induction Type: IV induction Ventilation: Mask ventilation without difficulty Laryngoscope Size: Glidescope and 3 Grade View: Grade I Tube type: Oral Tube size: 7.0 mm Number of attempts: 1 Airway Equipment and Method: Video-laryngoscopy and Rigid stylet Placement Confirmation: ETT inserted through vocal cords under direct vision,  positive ETCO2 and breath sounds checked- equal and bilateral Secured at: 22 cm Tube secured with: Tape Dental Injury: Teeth and Oropharynx as per pre-operative assessment

## 2019-02-12 NOTE — Transfer of Care (Signed)
Immediate Anesthesia Transfer of Care Note  Patient: Casey Wood  Procedure(s) Performed: ARTHROPLASTY BIPOLAR HIP (HEMIARTHROPLASTY) (Right Hip)  Patient Location: PACU  Anesthesia Type:General  Level of Consciousness: drowsy  Airway & Oxygen Therapy: Patient Spontanous Breathing  Post-op Assessment: Report given to RN and Post -op Vital signs reviewed and stable  Post vital signs: Reviewed and stable  Last Vitals:  Vitals Value Taken Time  BP 137/116 02/12/19 1624  Temp    Pulse 102 02/12/19 1625  Resp 18 02/12/19 1625  SpO2 94 % 02/12/19 1625  Vitals shown include unvalidated device data.  Last Pain:  Vitals:   02/12/19 0451  TempSrc: Oral  PainSc:          Complications: No apparent anesthesia complications

## 2019-02-12 NOTE — NC FL2 (Signed)
[place] LEVEL OF CARE SCREENING TOOL     IDENTIFICATION  Patient Name: Casey Wood Birthdate: 1922/09/14 Sex: female Admission Date (Current Location): 02/11/2019  [facility] and [place] Number:  Herbalist and Address:  [facility] [place]. [facility], [place] [address] [place], [place] [address]      Provider Number: [id number]  Attending Physician Name and Address:  Domenic Polite, MD  Relative Name and Phone Number:       Current Level of Care: Hospital Recommended Level of Care: [place] Prior Approval Number:    Date Approved/Denied:   PASRR Number:    Discharge Plan: SNF    Current Diagnoses: Patient Active Problem List   Diagnosis Date Noted  . Hip fracture ([place]) 02/11/2019  . Medicare annual wellness visit, subsequent 01/23/2019  . Unstageable pressure ulcer of sacral region ([place]) 11/11/2018  . Closed left humeral fracture 10/14/2018  . Goals of care, counseling/discussion   . Palliative care by specialist   . Left displaced femoral neck fracture ([place]) 10/03/2018  . Left ankle sprain 06/02/2018  . Chronic anxiety 05/11/2018  . Acquired hypothyroidism 04/25/2018  . Psychosis in elderly with behavioral disturbance ([place]) 04/08/2018  . Atrial fibrillation with RVR ([place]) 02/07/2018  . Advanced dementia ([place]) 02/07/2018  . Seizures ([place]) 02/07/2018  . Alcohol abuse 02/07/2018  . CHF (congestive heart failure) ([place]) 02/07/2018    Orientation RESPIRATION BLADDER Height & Weight     (disoriented x4)  Normal Incontinent Weight: 94 lb 9.2 oz (42.9 kg) Height:  5\' 2"  (157.5 cm)  BEHAVIORAL SYMPTOMS/MOOD NEUROLOGICAL BOWEL NUTRITION STATUS      Incontinent Diet(diet subject to change please see d/c summary)  AMBULATORY STATUS COMMUNICATION OF NEEDS Skin   Extensive Assist Verbally Surgical wounds(closed incision on left hip)                       Personal Care Assistance Level of Assistance  Bathing,  Feeding, Dressing Bathing Assistance: Maximum assistance Feeding assistance: Maximum assistance Dressing Assistance: Maximum assistance     Functional Limitations Info  Sight, Hearing, Speech Sight Info: Adequate Hearing Info: Adequate Speech Info: Adequate    SPECIAL CARE FACTORS FREQUENCY  PT (By licensed PT), OT (By licensed OT)     PT Frequency: 5x OT Frequency: 5x            Contractures Contractures Info: Not present    Additional Factors Info  Code Status, Allergies Code Status Info: DNR Allergies Info: Levofloxacin Psychotropic Info: ALPRAZolam (XANAX) tablet 0.25 mg daily at bedtime PO; busPIRone (BUSPAR) tablet 5 mg 2x day PO;QUEtiapine (SEROQUEL) tablet 12.5 mg daily at bedtime PO; QUEtiapine (SEROQUEL) tablet 25 mg every morning at 10am         Current Medications (02/12/2019):  This is the current hospital active medication list Current Facility-Administered Medications  Medication Dose Route Frequency Provider Last Rate Last Admin  . acetaminophen (TYLENOL) tablet 650 mg  650 mg Oral Q6H PRN Manuella Ghazi, Pratik D, DO       Or  . acetaminophen (TYLENOL) suppository 650 mg  650 mg Rectal Q6H PRN Manuella Ghazi, Pratik D, DO      . ALPRAZolam Duanne Moron) tablet 0.25 mg  0.25 mg Oral QHS Shah, Pratik D, DO   0.25 mg at 02/11/19 2158  . ascorbic acid (VITAMIN C) tablet 500 mg  500 mg Oral Daily Manuella Ghazi, Pratik D, DO   500 mg at 02/11/19 1202  . busPIRone (  BUSPAR) tablet 5 mg  5 mg Oral BID Manuella Ghazi, Pratik D, DO   5 mg at 02/12/19 3568  . ceFAZolin (ANCEF) IVPB 2g/100 mL premix  2 g Intravenous On Call to OR Marybelle Killings, MD      . chlorhexidine (HIBICLENS) 4 % liquid 4 application  60 mL Topical Once Marybelle Killings, MD      . Chlorhexidine Gluconate Cloth 2 % PADS 6 each  6 each Topical Q0600 Manuella Ghazi, Pratik D, DO      . diltiazem (CARDIZEM) 125 mg in dextrose 5% 125 mL (1 mg/mL) infusion  5-15 mg/hr Intravenous Titrated Manuella Ghazi, Pratik D, DO 12.5 mL/hr at 02/12/19 0748 12.5 mg/hr at 02/12/19  0748  . feeding supplement (ENSURE PRE-SURGERY) liquid 296 mL  296 mL Oral Once Marybelle Killings, MD      . feeding supplement (PRO-STAT SUGAR FREE 64) liquid 30 mL  30 mL Oral BID BM Manuella Ghazi, Pratik D, DO      . levothyroxine (SYNTHROID) tablet 50 mcg  50 mcg Oral QAC breakfast Manuella Ghazi, Pratik D, DO   50 mcg at 02/12/19 0556  . mupirocin ointment (BACTROBAN) 2 % 1 application  1 application Nasal BID Heath Lark D, DO   1 application at 61/68/37 [phone]  . ondansetron (ZOFRAN) tablet 4 mg  4 mg Oral Q6H PRN Manuella Ghazi, Pratik D, DO       Or  . ondansetron (ZOFRAN) injection 4 mg  4 mg Intravenous Q6H PRN Manuella Ghazi, Pratik D, DO      . polyethylene glycol (MIRALAX / GLYCOLAX) packet 17 g  17 g Oral Daily PRN Manuella Ghazi, Pratik D, DO      . povidone-iodine 10 % swab 2 application  2 application Topical Once Marybelle Killings, MD      . QUEtiapine (SEROQUEL) tablet 12.5 mg  12.5 mg Oral BID Manuella Ghazi, Pratik D, DO   12.5 mg at 02/12/19 1115  . zinc sulfate capsule 220 mg  220 mg Oral Daily Heath Lark D, DO   220 mg at 02/11/19 1203     Discharge Medications: Please see discharge summary for a list of discharge medications.  Relevant Imaging Results:  Relevant Lab Results:   Additional Information SSN:[id number] [place] Mina Babula, LCSW

## 2019-02-13 ENCOUNTER — Encounter (HOSPITAL_COMMUNITY): Payer: Self-pay | Admitting: Internal Medicine

## 2019-02-13 DIAGNOSIS — F0390 Unspecified dementia without behavioral disturbance: Secondary | ICD-10-CM

## 2019-02-13 DIAGNOSIS — I4821 Permanent atrial fibrillation: Secondary | ICD-10-CM | POA: Diagnosis present

## 2019-02-13 HISTORY — DX: Permanent atrial fibrillation: I48.21

## 2019-02-13 LAB — CBC
HCT: 39.2 % (ref 36.0–46.0)
Hemoglobin: 13.3 g/dL (ref 12.0–15.0)
MCH: 34.2 pg — ABNORMAL HIGH (ref 26.0–34.0)
MCHC: 33.9 g/dL (ref 30.0–36.0)
MCV: 100.8 fL — ABNORMAL HIGH (ref 80.0–100.0)
Platelets: 128 10*3/uL — ABNORMAL LOW (ref 150–400)
RBC: 3.89 MIL/uL (ref 3.87–5.11)
RDW: 14.1 % (ref 11.5–15.5)
WBC: 11.1 10*3/uL — ABNORMAL HIGH (ref 4.0–10.5)
nRBC: 0 % (ref 0.0–0.2)

## 2019-02-13 LAB — BASIC METABOLIC PANEL
Anion gap: 11 (ref 5–15)
BUN: 14 mg/dL (ref 8–23)
CO2: 21 mmol/L — ABNORMAL LOW (ref 22–32)
Calcium: 8.8 mg/dL — ABNORMAL LOW (ref 8.9–10.3)
Chloride: 107 mmol/L (ref 98–111)
Creatinine, Ser: 0.62 mg/dL (ref 0.44–1.00)
GFR calc Af Amer: >60 mL/min (ref >60)
GFR calc non Af Amer: >60 mL/min (ref >60)
Glucose, Bld: 129 mg/dL — ABNORMAL HIGH (ref 70–99)
Potassium: 4.1 mmol/L (ref 3.5–5.1)
Sodium: 139 mmol/L (ref 135–145)

## 2019-02-13 MED ORDER — BISACODYL 10 MG RE SUPP: 10.0000 mg | Freq: Every day | RECTAL | Status: DC | PRN

## 2019-02-13 MED ORDER — DILTIAZEM HCL 60 MG PO TABS
90.0000 mg | ORAL_TABLET | Freq: Three times a day (TID) | ORAL | Status: DC
  Administered 2019-02-13 – 2019-02-14 (×4): 90 mg via ORAL
  Filled 2019-02-13 (×4): qty 2

## 2019-02-13 MED ORDER — DILTIAZEM HCL ER COATED BEADS 180 MG PO CP24
300.0000 mg | ORAL_CAPSULE | Freq: Every day | ORAL | Status: DC
  Administered 2019-02-13: 120 mg via ORAL
  Filled 2019-02-13: qty 1

## 2019-02-13 MED ORDER — SENNA 8.6 MG PO TABS
2.0000 | ORAL_TABLET | Freq: Every day | ORAL | Status: DC
  Administered 2019-02-13 – 2019-02-14 (×2): 17.2 mg via ORAL
  Filled 2019-02-13 (×2): qty 2

## 2019-02-13 MED ORDER — SODIUM CHLORIDE 0.9 % IV BOLUS
500.0000 mL | Freq: Once | INTRAVENOUS | Status: AC
  Administered 2019-02-13: 500 mL via INTRAVENOUS

## 2019-02-13 MED ORDER — POLYETHYLENE GLYCOL 3350 17 G PO PACK: 17.0000 g | PACK | Freq: Every day | ORAL | Status: DC | PRN

## 2019-02-13 MED ORDER — ASPIRIN 325 MG PO TABS
325.0000 mg | ORAL_TABLET | Freq: Every day | ORAL | Status: DC
  Administered 2019-02-13 – 2019-02-14 (×2): 325 mg via ORAL
  Filled 2019-02-13 (×2): qty 1

## 2019-02-13 NOTE — Anesthesia Postprocedure Evaluation (Signed)
Anesthesia Post Note  Patient: Casey Wood  Procedure(s) Performed: ARTHROPLASTY BIPOLAR HIP (HEMIARTHROPLASTY) (Right Hip)     Patient location during evaluation: PACU Anesthesia Type: General Level of consciousness: confused (at baseline) Pain management: pain level controlled Vital Signs Assessment: post-procedure vital signs reviewed and stable Respiratory status: spontaneous breathing, nonlabored ventilation and respiratory function stable Cardiovascular status: blood pressure returned to baseline, stable and tachycardic Postop Assessment: no apparent nausea or vomiting Anesthetic complications: no    Last Vitals:  Vitals:   02/13/19 0041 02/13/19 0415  BP:  (!) 107/58  Pulse: 90 99  Resp: 20 18  Temp: 37.1 C 37.1 C  SpO2: 94% 96%    Last Pain:  Vitals:   02/13/19 0415  TempSrc: Axillary  PainSc:    Pain Goal:                   [name]

## 2019-02-13 NOTE — Progress Notes (Signed)
PROGRESS NOTE    Casey Wood   LEX:[id number]  DOB: September 28, 1922  DOA: 02/11/2019 PCP: Hennie Duos, MD   Brief Narrative:  Casey Wood is a 85 y.o.femalewith medical history significant foratrial fibrillation not on anticoagulation, recent left femoral neck fracture with arthroplasty on 10/05/2018 dementia with psychosis, CHF, seizures, hypothyroidism, and hypertension who was brought from her SNF due to a fall that was noticed approximately 3 days ago. At that time her right foot was wedged underneath her bed. She was noted to have a hip x-ray on 12/19 demonstrating a displaced subcapital right femoral fracture.  sent to ED today for further evaluation. She cannot provide any history herself as she has advanced dementia  -She was transferred from [facility] emergency room, was also noted to be in rapid A. fib in the ED and was started on a Cardizem drip   Subjective: Confused and agitated    Assessment & Plan:   Principal Problem: Right hip fracture -12/21-status post right hemiarthroplasty by Dr. Lorin Mercy -She will most likely need to return to SNF  Active Problems:   Atrial fibrillation with RVR  -On Cardizem infusion which is being managed by cardiology-plan is to change to oral Cardizem today-patient is intermittently not taking medications and we are trying to give her medications that can easily be crushed-continue to follow heart rate    Advanced dementia  With agitation-according to daughter who is at bedside, she does not plan to be aggressive with the patient but did want the surgery for her hip fracture to help control pain -Continue BuSpar, Xanax and Seroquel  Hypothyroidism -Continue Synthroid  Time spent in minutes: 35 DVT prophylaxis: Full dose aspirin Code Status: Not resuscitate Family Communication: Daughter at bedside Disposition Plan: Return to SNF Consultants:   Orthopedic surgery  Cardiology Procedures:  Right hip  hemiarthroplasty Antimicrobials:  Anti-infectives (From admission, onward)   Start     Dose/Rate Route Frequency Ordered Stop   02/12/19 1500  ceFAZolin (ANCEF) IVPB 2g/100 mL premix     2 g 200 mL/hr over 30 Minutes Intravenous To ShortStay Surgical 02/12/19 1038 02/12/19 1515       Objective: Vitals:   02/13/19 0036 02/13/19 0041 02/13/19 0415 02/13/19 1140  BP: 120/65  (!) 107/58 106/66  Pulse:  90 99   Resp:  20 18   Temp:  98.7 F (37.1 C) 98.7 F (37.1 C)   TempSrc:  Axillary Axillary   SpO2:  94% 96%   Weight:   41.7 kg   Height:        Intake/Output Summary (Last 24 hours) at 02/13/2019 1531 Last data filed at 02/13/2019 0700 Gross per 24 hour  Intake 1463.69 ml  Output 475 ml  Net 988.69 ml   Filed Weights   02/11/19 0823 02/12/19 0451 02/13/19 0415  Weight: 42 kg 42.9 kg 41.7 kg    Examination: General exam: Appears comfortable  HEENT: PERRLA, oral mucosa moist, no sclera icterus or thrush Respiratory system: Clear to auscultation. Respiratory effort normal. Cardiovascular system: S1 & S2 heard, RRR.   Gastrointestinal system: Abdomen soft, non-tender, nondistended. Normal bowel sounds. Central nervous system: Alert-disoriented-moves arms independently-does not follow commands Extremities: No cyanosis, clubbing or edema Skin: No rashes or ulcers Psychiatry: Confused and restless    Data Reviewed: I have personally reviewed following labs and imaging studies  CBC: Recent Labs  Lab 02/11/19 0845 02/12/19 0332 02/13/19 0408  WBC 9.2 7.7 11.1*  NEUTROABS 6.6  --   --  HGB 14.7 13.9 13.3  HCT 43.7 40.3 39.2  MCV 102.1* 98.8 100.8*  PLT 144* 133* 503*   Basic Metabolic Panel: Recent Labs  Lab 02/11/19 0845 02/12/19 0332 02/13/19 0408  NA 139 142 139  K 4.3 3.9 4.1  CL 106 106 107  CO2 24 25 21*  GLUCOSE 122* 105* 129*  BUN 22 18 14   CREATININE 0.64 0.77 0.62  CALCIUM 9.3 9.2 8.8*  MG  --  2.2  --    GFR: Estimated Creatinine  Clearance: 28.3 mL/min (by C-G formula based on SCr of 0.62 mg/dL). Liver Function Tests: No results for input(s): AST, ALT, ALKPHOS, BILITOT, PROT, ALBUMIN in the last 168 hours. No results for input(s): LIPASE, AMYLASE in the last 168 hours. No results for input(s): AMMONIA in the last 168 hours. Coagulation Profile: No results for input(s): INR, PROTIME in the last 168 hours. Cardiac Enzymes: No results for input(s): CKTOTAL, CKMB, CKMBINDEX, TROPONINI in the last 168 hours. BNP (last 3 results) No results for input(s): PROBNP in the last 8760 hours. HbA1C: No results for input(s): HGBA1C in the last 72 hours. CBG: No results for input(s): GLUCAP in the last 168 hours. Lipid Profile: No results for input(s): CHOL, HDL, LDLCALC, TRIG, CHOLHDL, LDLDIRECT in the last 72 hours. Thyroid Function Tests: No results for input(s): TSH, T4TOTAL, FREET4, T3FREE, THYROIDAB in the last 72 hours. Anemia Panel: No results for input(s): VITAMINB12, FOLATE, FERRITIN, TIBC, IRON, RETICCTPCT in the last 72 hours. Urine analysis:    Component Value Date/Time   COLORURINE AMBER (A) 02/12/2019 1559   APPEARANCEUR CLOUDY (A) 02/12/2019 1559   LABSPEC 1.018 02/12/2019 1559   PHURINE 6.0 02/12/2019 1559   GLUCOSEU NEGATIVE 02/12/2019 1559   HGBUR SMALL (A) 02/12/2019 1559   BILIRUBINUR NEGATIVE 02/12/2019 1559   KETONESUR NEGATIVE 02/12/2019 1559   PROTEINUR 30 (A) 02/12/2019 1559   NITRITE NEGATIVE 02/12/2019 1559   LEUKOCYTESUR MODERATE (A) 02/12/2019 1559   Sepsis Labs: @LABRCNTIP (procalcitonin:4,lacticidven:4) ) Recent Results (from the past 240 hour(s))  Novel Coronavirus, NAA (Hosp order, Send-out to Ref Lab; TAT 18-24 hrs     Status: None   Collection Time: 02/07/19  5:28 PM  Result Value Ref Range Status   SARS-CoV-2, NAA NOT DETECTED NOT DETECTED Final    Comment: (NOTE) This nucleic acid amplification test was developed and its performance characteristics determined by [employer]. Nucleic acid amplification tests include PCR and TMA. This test has not been FDA cleared or approved. This test has been authorized by FDA under an Emergency Use Authorization (EUA). This test is only authorized for the duration of time the declaration that circumstances exist justifying the authorization of the emergency use of in vitro diagnostic tests for detection of SARS-CoV-2 virus and/or diagnosis of COVID-19 infection under section 564(b)(1) of the Act, 21 U.S.C. 546FKC-1(E) (1), unless the authorization is terminated or revoked sooner. When diagnostic testing is negative, the possibility of a false negative result should be considered in the context of a patient's recent exposures and the presence of clinical signs and symptoms consistent with COVID-19. An individual without symptoms of COVID- 19 and who is not shedding SARS-CoV-2 vi rus would expect to have a negative (not detected) result in this assay. Performed At: [facility] [address] [place], [place] [id number] Rush Farmer MD BS:[id number]    [place]  Final    Comment: Performed at [facility], [place] [address], [place], [place] [address]  SARS CORONAVIRUS 2 (  TAT 6-24 HRS) Nasopharyngeal Nasopharyngeal Swab     Status: None   Collection Time: 02/11/19  8:48 AM   Specimen: Nasopharyngeal Swab  Result Value Ref Range Status   SARS Coronavirus 2 NEGATIVE NEGATIVE Final    Comment: (NOTE) SARS-CoV-2 target nucleic acids are NOT DETECTED. The SARS-CoV-2 RNA is generally detectable in upper and lower respiratory specimens during the acute phase of infection. Negative results do not preclude SARS-CoV-2 infection, do not rule out co-infections with other pathogens, and should not be used as the sole basis for treatment or other patient management decisions. Negative results must be combined with clinical observations, patient history, and epidemiological  information. The expected result is Negative. Fact Sheet for Patients: SugarRoll.be Fact Sheet for Healthcare Providers: https://www.woods-mathews.com/ This test is not yet approved or cleared by the [place] FDA and  has been authorized for detection and/or diagnosis of SARS-CoV-2 by FDA under an Emergency Use Authorization (EUA). This EUA will remain  in effect (meaning this test can be used) for the duration of the COVID-19 declaration under Section 56 4(b)(1) of the Act, 21 U.S.C. section 360bbb-3(b)(1), unless the authorization is terminated or revoked sooner. Performed at [facility], [place] [address], [place], [place] [address]   Surgical PCR screen     Status: Abnormal   Collection Time: 02/11/19 11:51 PM   Specimen: Nasal Mucosa; Nasal Swab  Result Value Ref Range Status   MRSA, PCR POSITIVE (A) NEGATIVE Final    Comment: CRITICAL RESULT CALLED TO, READ BACK BY AND VERIFIED WITH: RN, D. Carman Ching [id number] @0508  THANEY    Staphylococcus aureus POSITIVE (A) NEGATIVE Final    Comment: CRITICAL RESULT CALLED TO, READ BACK BY AND VERIFIED WITH: RN, D. GREESON [id number] @0508  THANEY Performed at [facility] [address] [place], [place] [address]          Radiology Studies: Pelvis Portable  Result Date: 02/12/2019 CLINICAL DATA:  Status post right hip replacement. EXAM: PORTABLE PELVIS 1-2 VIEWS COMPARISON:  Yesterday and 10/05/2018. FINDINGS: Interval right hip prosthesis in satisfactory position and alignment. No fracture or dislocation seen in the frontal projection. Overlying skin clips. Stable left hip prosthesis and diffuse osteopenia. IMPRESSION: Satisfactory postoperative appearance of a right hip prosthesis. Electronically Signed   By: Claudie Revering M.D.   On: 02/12/2019 17:03      Scheduled Meds: . ALPRAZolam  0.25 mg Oral QHS  . ascorbic acid  500 mg Oral Daily  . aspirin  325 mg Oral Daily  .  busPIRone  5 mg Oral BID  . Chlorhexidine Gluconate Cloth  6 each Topical Q0600  . diltiazem  90 mg Oral Q8H  . docusate sodium  100 mg Oral BID  . feeding supplement (PRO-STAT SUGAR FREE 64)  30 mL Oral BID BM  . levothyroxine  50 mcg Oral QAC breakfast  . mupirocin ointment  1 application Nasal BID  . QUEtiapine  12.5 mg Oral BID  . senna  2 tablet Oral Daily  . zinc sulfate  220 mg Oral Daily   Continuous Infusions: . lactated ringers 10 mL/hr at 02/12/19 1412     LOS: 2 days      Debbe Odea, MD [facility] Pager: [email or web address] Password [facility] 02/13/2019, 3:31 PM

## 2019-02-13 NOTE — Progress Notes (Addendum)
300 mg Po Cardizem ordered.  Patient gummed at med (120mg  cardizem) until it broke broke.  Patient swallowed beads with applesauce.  Rosaria Ferries, NP notified.  Stated to not give the 180mg  Cardizem and to just continue IV Cardizem until further oders from her received.  IV Cardizem decreased at this time to 10 mg/hr, Rosaria Ferries, PA aware.

## 2019-02-13 NOTE — Progress Notes (Addendum)
Progress Note  Patient Name: Casey Wood Date of Encounter: 02/13/2019  Primary Cardiologist:  Peter Martinique, MD 10/04/2018 in-hospital before L hip repair  Subjective   Responds to name, not oriented, not able to answer questions  Inpatient Medications    Scheduled Meds: . ALPRAZolam  0.25 mg Oral QHS  . ascorbic acid  500 mg Oral Daily  . aspirin EC  325 mg Oral Q breakfast  . busPIRone  5 mg Oral BID  . Chlorhexidine Gluconate Cloth  6 each Topical Q0600  . docusate sodium  100 mg Oral BID  . feeding supplement (PRO-STAT SUGAR FREE 64)  30 mL Oral BID BM  . levothyroxine  50 mcg Oral QAC breakfast  . mupirocin ointment  1 application Nasal BID  . QUEtiapine  12.5 mg Oral BID  . zinc sulfate  220 mg Oral Daily   Continuous Infusions: . diltiazem (CARDIZEM) infusion 12.5 mg/hr (02/13/19 0700)  . lactated ringers 10 mL/hr at 02/12/19 1412   PRN Meds: acetaminophen **OR** acetaminophen, menthol-cetylpyridinium **OR** phenol, metoCLOPramide **OR** metoCLOPramide (REGLAN) injection, ondansetron **OR** ondansetron (ZOFRAN) IV, polyethylene glycol, traMADol   Vital Signs    Vitals:   02/12/19 2034 02/13/19 0036 02/13/19 0041 02/13/19 0415  BP: 123/74 120/65  (!) 107/58  Pulse: 89  90 99  Resp: 20  20 18   Temp: 98.5 F (36.9 C)  98.7 F (37.1 C) 98.7 F (37.1 C)  TempSrc: Axillary  Axillary Axillary  SpO2: 95%  94% 96%  Weight:    41.7 kg  Height:        Intake/Output Summary (Last 24 hours) at 02/13/2019 0853 Last data filed at 02/13/2019 0700 Gross per 24 hour  Intake 1576.19 ml  Output 1375 ml  Net 201.19 ml   Filed Weights   02/11/19 0823 02/12/19 0451 02/13/19 0415  Weight: 42 kg 42.9 kg 41.7 kg   Last Weight  Most recent update: 02/13/2019  4:53 AM   Weight  41.7 kg (91 lb 14.9 oz)           Weight change: -0.3 kg   Telemetry    Atrial fib, rate generally controlled - Personally Reviewed  ECG    None today - Personally  Reviewed  Physical Exam   General: Well developed, well nourished, female appearing in no acute distress. Head: Normocephalic, atraumatic.  Neck: Supple without bruits, JVD not elevated. Lungs:  Resp regular and unlabored, Clear anteriorly. Heart: Irreg R&R, S1, S2, no S3, S4, 2/6 murmur; no rub. Abdomen: Soft, non-tender, non-distended with normoactive bowel sounds. No hepatomegaly. No rebound/guarding. No obvious abdominal masses. Extremities: No clubbing, cyanosis, no edema. Distal pedal pulses are 1-2+ bilaterally. Neuro: Alert and oriented X 1. Moves all extremities spontaneously. Psych: Normal affect.  Labs    Hematology Recent Labs  Lab 02/11/19 0845 02/12/19 0332 02/13/19 0408  WBC 9.2 7.7 11.1*  RBC 4.28 4.08 3.89  HGB 14.7 13.9 13.3  HCT 43.7 40.3 39.2  MCV 102.1* 98.8 100.8*  MCH 34.3* 34.1* 34.2*  MCHC 33.6 34.5 33.9  RDW 14.2 13.9 14.1  PLT 144* 133* 128*    Chemistry Recent Labs  Lab 02/11/19 0845 02/12/19 0332 02/13/19 0408  NA 139 142 139  K 4.3 3.9 4.1  CL 106 106 107  CO2 24 25 21*  GLUCOSE 122* 105* 129*  BUN 22 18 14   CREATININE 0.64 0.77 0.62  CALCIUM 9.3 9.2 8.8*  GFRNONAA >60 >60 >60  GFRAA >60 >60 >60  ANIONGAP  9 11 11      High Sensitivity Troponin:  No results for input(s): TROPONINIHS in the last 720 hours.    BNPNo results for input(s): BNP, PROBNP in the last 168 hours.   DDimer No results for input(s): DDIMER in the last 168 hours.   Radiology    DG Chest 1 View  Result Date: 02/11/2019 CLINICAL DATA:  Femur fracture EXAM: CHEST  1 VIEW COMPARISON:  10/03/2018 FINDINGS: Borderline heart size with aortic tortuosity distorted by leftward rotation. Anterior right fourth rib sclerotic focus also seen on prior, likely remote fracture or bone island in isolation. Remote left humeral neck fracture. Midthoracic compression fractures not primarily evaluated. There is no edema, consolidation, effusion, or pneumothorax. IMPRESSION: No  evidence of acute cardiopulmonary disease. Electronically Signed   By: Monte Fantasia M.D.   On: 02/11/2019 09:46   Pelvis Portable  Result Date: 02/12/2019 CLINICAL DATA:  Status post right hip replacement. EXAM: PORTABLE PELVIS 1-2 VIEWS COMPARISON:  Yesterday and 10/05/2018. FINDINGS: Interval right hip prosthesis in satisfactory position and alignment. No fracture or dislocation seen in the frontal projection. Overlying skin clips. Stable left hip prosthesis and diffuse osteopenia. IMPRESSION: Satisfactory postoperative appearance of a right hip prosthesis. Electronically Signed   By: Claudie Revering M.D.   On: 02/12/2019 17:03   DG HIP UNILAT WITH PELVIS 2-3 VIEWS RIGHT  Result Date: 02/11/2019 CLINICAL DATA:  Fall with femur fracture EXAM: DG HIP (WITH OR WITHOUT PELVIS) 2-3V RIGHT COMPARISON:  12/26/2018 FINDINGS: Right femoral neck fracture with moderate displacement, subcapital in appearance. Left hip hemiarthroplasty and remote left obturator ring fracture. The bones are osteopenic. Prominent stool volume over the pelvis. IMPRESSION: Displaced right femoral neck fracture. Electronically Signed   By: Monte Fantasia M.D.   On: 02/11/2019 09:45     Cardiac Studies   None  Patient Profile     85 y.o. female w/ hx remote alcohol abuse, dementia, atrial fibrillation, hypothyroidism, hypertension, lung cancer status post lobectomy, TIA, seizure, was admitted 12/20 after a fall w/ R hip fx. Cards consult preop.  Assessment & Plan    1. Atrial fib, permanent - rate generally well-controlled on dilt 12.5 mg/hr IV - at home, she was on Dilt 240 mg/day - change to po at 300 mg/day and see how tolerated - unless she has additional problems, no further cardiac eval needed.   Principal Problem:   Hip fracture [facility]) Active Problems:   Atrial fibrillation, permanent ([place])  Signed, Rosaria Ferries , PA-C 8:53 AM 02/13/2019 Pager: [phone]  [facility] will sign off.    Medication Recommendations:  Cardizem CD 300 mg qd Other recommendations (labs, testing, etc):  none Follow up as an outpatient:  Prn  Patient seen and examined.  Agree with above documentation. Surgery yesterday, no complications.  On exam today, she remains disoriented and uncooperative, irregular rhythm/regular rate, no murmurs, lungs clear to auscultation bilaterally in anterior lung fields.  Rates controlled on diltiazem drip, would recommend switching to p.o. diltiazem CD 300 mg daily once able to take p.o.  Donato Heinz, MD

## 2019-02-13 NOTE — Evaluation (Signed)
Physical Therapy Evaluation Patient Details Name: Casey Wood MRN: [id number] DOB: 12-03-1922 Today's Date: 02/13/2019   History of Present Illness  85yo female with history of L hip fracture and subsequent L anterior THA in August 2020. Now s/p fall at her facility, found to have R femoral neck fracture, and received R posterior THA on 02/12/19. PMH A-fib, CHF, dementia, HTN, lung CA, psychosis, seizures, TIA, lobectomy, B TKR, L THA  Clinical Impression   Patient received in bed, A&Ox0, opens eyes to tactile and verbal stimulation but very confabulatory, mutters and mumbles and is quite difficult to understand. Allowed some gentle PROM of B hip flexion and abduction through small ranges of motion (within precautions), however otherwise very guarded and physically resistant, did not allow knee flexion or ankle dorsiflexion on either side. BLEs very tender. Deferred attempts at mobility due to guarding/physical resistance in face of high pain levels, will need +2 to progress mobility while maintaining posterior hip precautions. She was left in bed positioned to comfort with all needs met, bed alarm active. Currently recommending return to SNF with 24/7A moving forward.      Follow Up Recommendations SNF;Supervision/Assistance - 24 hour    Equipment Recommendations  Wheelchair (measurements PT);Wheelchair cushion (measurements PT);Hospital bed    Recommendations for Other Services       Precautions / Restrictions Precautions Precautions: Fall;Posterior Hip Precaution Booklet Issued: No Precaution Comments: Pt unable to carryover edu from baseline dementia Restrictions Weight Bearing Restrictions: Yes RLE Weight Bearing: Weight bearing as tolerated LLE Weight Bearing: Weight bearing as tolerated Other Position/Activity Restrictions: posterior precautions R LE/anterior precautions L LE      Mobility  Bed Mobility               General bed mobility comments: deferred-  physical resistance and guarding, high pain levels with attempted mobility  Transfers                 General transfer comment: deferred- physical resistance and guarding, high pain levels with attempted mobility  Ambulation/Gait             General Gait Details: deferred- physical resistance and guarding, high pain levels with attempted mobility  Stairs            Wheelchair Mobility    Modified Rankin (Stroke Patients Only)       Balance                                             Pertinent Vitals/Pain Pain Assessment: Faces Faces Pain Scale: Hurts whole lot Pain Location: B LE with movement Pain Descriptors / Indicators: Grimacing;Guarding Pain Intervention(s): Limited activity within patient's tolerance;Monitored during session    Home Living Family/patient expects to be discharged to:: Skilled nursing facility                 Additional Comments: from [facility], pt A&Ox0 so unable to provide functional history    Prior Function Level of Independence: Needs assistance   Gait / Transfers Assistance Needed: Chart states pt used w/c  ADL's / Homemaking Assistance Needed: Unclear how much assistance pt needed        Hand Dominance        Extremity/Trunk Assessment   Upper Extremity Assessment Upper Extremity Assessment: Generalized weakness;Difficult to assess due to impaired cognition    Lower Extremity Assessment Lower  Extremity Assessment: Generalized weakness;Difficult to assess due to impaired cognition RLE Deficits / Details: difficult to assess due to cognitive deficits limiting command following, allowed small amount of hip flexion and abduction PROM but otherwise limited by guarding and physical resistance LLE Deficits / Details: difficult to assess due to cognitive deficits limiting command following, allowed small amount of hip flexion and abduction PROM but otherwise limited by guarding and physical  resistance    Cervical / Trunk Assessment Cervical / Trunk Assessment: Kyphotic  Communication   Communication: Expressive difficulties(mumbled speech, muttering, confabulatory)  Cognition Arousal/Alertness: Awake/alert Behavior During Therapy: Restless Overall Cognitive Status: History of cognitive impairments - at baseline                                 General Comments: Pt unable to communicate accurately. pt not able to follow directions, A&Ox0      General Comments General comments (skin integrity, edema, etc.): deferred EOB due to physical resistance/guarding and high pain levels making it difficult to maintain posterior hip precautions with +1    Exercises     Assessment/Plan    PT Assessment Patient needs continued PT services  PT Problem List Decreased strength;Decreased range of motion;Decreased activity tolerance;Decreased balance;Decreased mobility;Decreased cognition;Decreased safety awareness;Decreased knowledge of use of DME;Decreased knowledge of precautions;Cardiopulmonary status limiting activity;Pain       PT Treatment Interventions Functional mobility training;Therapeutic activities;Therapeutic exercise;Cognitive remediation;Wheelchair mobility training;Patient/family education;Modalities;Manual techniques    PT Goals (Current goals can be found in the Care Plan section)  Acute Rehab PT Goals Patient Stated Goal: unable PT Goal Formulation: Patient unable to participate in goal setting Time For Goal Achievement: 02/27/19 Potential to Achieve Goals: Fair    Frequency Min 3X/week   Barriers to discharge        Co-evaluation               AM-PAC PT "6 Clicks" Mobility  Outcome Measure Help needed turning from your back to your side while in a flat bed without using bedrails?: Total Help needed moving from lying on your back to sitting on the side of a flat bed without using bedrails?: Total Help needed moving to and from a bed to  a chair (including a wheelchair)?: Total Help needed standing up from a chair using your arms (e.g., wheelchair or bedside chair)?: Total Help needed to walk in hospital room?: Total Help needed climbing 3-5 steps with a railing? : Total 6 Click Score: 6    End of Session   Activity Tolerance: Patient limited by pain;Other (comment)(limited by cognition and poor command following) Patient left: in bed;with call bell/phone within reach;with bed alarm set   PT Visit Diagnosis: Muscle weakness (generalized) (M62.81);Difficulty in walking, not elsewhere classified (R26.2);Pain Pain - Right/Left: (bilateral) Pain - part of body: Hip    Time: 9826-4158 PT Time Calculation (min) (ACUTE ONLY): 10 min   Charges:   PT Evaluation $PT Eval Moderate Complexity: 1 Mod          Windell Norfolk, DPT, PN1   Supplemental Physical Therapist [place]    Pager [phone] Acute Rehab Office [phone]

## 2019-02-13 NOTE — Progress Notes (Signed)
  Contacted by RN re: problems w/ pt swallowing capsule. She needs meds that can be crushed for more consistent med administration  Changed Cardizem to 90 mg q 8 hr for a total of 270 mg every day.  Continue to follow HR/BP  Rosaria Ferries, PA-C 02/13/2019 2:13 PM Bee[facility] [phone]

## 2019-02-13 NOTE — Progress Notes (Signed)
Patient had no void so far this shift, bladder scan showed 209cc in bladder.  Dr. Wynelle Cleveland notified. Due to poor intake, 500cc NS bolus IV ordered and will continue to encourage po intake.  Will recheck bladder scan in 6 hours.

## 2019-02-14 ENCOUNTER — Inpatient Hospital Stay
Admission: RE | Admit: 2019-02-14 | Discharge: 2019-04-12 | Disposition: A | Discharge status: Nursing Home (Includes ICF) | Payer: Medicare HMO | Source: Ambulatory Visit | Attending: Internal Medicine | Admitting: Internal Medicine

## 2019-02-14 LAB — CBC
HCT: 36.9 % (ref 36.0–46.0)
Hemoglobin: 12.6 g/dL (ref 12.0–15.0)
MCH: 34.9 pg — ABNORMAL HIGH (ref 26.0–34.0)
MCHC: 34.1 g/dL (ref 30.0–36.0)
MCV: 102.2 fL — ABNORMAL HIGH (ref 80.0–100.0)
Platelets: 145 10*3/uL — ABNORMAL LOW (ref 150–400)
RBC: 3.61 MIL/uL — ABNORMAL LOW (ref 3.87–5.11)
RDW: 14.1 % (ref 11.5–15.5)
WBC: 9.6 10*3/uL (ref 4.0–10.5)
nRBC: 0 % (ref 0.0–0.2)

## 2019-02-14 LAB — BASIC METABOLIC PANEL
Anion gap: 11 (ref 5–15)
BUN: 21 mg/dL (ref 8–23)
CO2: 23 mmol/L (ref 22–32)
Calcium: 8.9 mg/dL (ref 8.9–10.3)
Chloride: 110 mmol/L (ref 98–111)
Creatinine, Ser: 0.89 mg/dL (ref 0.44–1.00)
GFR calc Af Amer: >60 mL/min (ref >60)
GFR calc non Af Amer: 55 mL/min — ABNORMAL LOW (ref >60)
Glucose, Bld: 121 mg/dL — ABNORMAL HIGH (ref 70–99)
Potassium: 4 mmol/L (ref 3.5–5.1)
Sodium: 144 mmol/L (ref 135–145)

## 2019-02-14 MED ORDER — DOCUSATE SODIUM 50 MG/5ML PO LIQD: 100.0000 mg | Freq: Two times a day (BID) | ORAL | 0 refills | Status: DC

## 2019-02-14 MED ORDER — DOCUSATE SODIUM 50 MG/5ML PO LIQD
100.0000 mg | Freq: Two times a day (BID) | ORAL | Status: DC
  Administered 2019-02-14: 100 mg via ORAL
  Filled 2019-02-14 (×2): qty 10

## 2019-02-14 MED ORDER — DILTIAZEM HCL 90 MG PO TABS: 90.0000 mg | ORAL_TABLET | Freq: Three times a day (TID) | ORAL | Status: DC

## 2019-02-14 MED ORDER — SENNA 8.6 MG PO TABS: 2.0000 | ORAL_TABLET | Freq: Every day | ORAL | 0 refills | Status: DC

## 2019-02-14 MED ORDER — ASPIRIN 325 MG PO TABS: 325.0000 mg | ORAL_TABLET | Freq: Every day | ORAL | Status: DC

## 2019-02-14 MED ORDER — BISACODYL 10 MG RE SUPP: 10.0000 mg | Freq: Every day | RECTAL | 0 refills | Status: DC | PRN

## 2019-02-14 NOTE — Progress Notes (Signed)
PROGRESS NOTE    Casey Wood   JOA:[id number]  DOB: 1923/01/24  DOA: 02/11/2019 PCP: Hennie Duos, MD   Brief Narrative:  Casey Wood is a 85 y.o.femalewith medical history significant foratrial fibrillation not on anticoagulation, recent left femoral neck fracture with arthroplasty on 10/05/2018 dementia with psychosis, CHF, seizures, hypothyroidism, and hypertension who was brought from her SNF due to a fall that was noticed approximately 3 days ago. At that time her right foot was wedged underneath her bed. She was noted to have a hip x-ray on 12/19 demonstrating a displaced subcapital right femoral fracture.  sent to ED today for further evaluation. She cannot provide any history herself as she has advanced dementia  -She was transferred from [facility] emergency room, was also noted to be in rapid A. fib in the ED and was started on a Cardizem drip   Subjective: Sleepy this morning but easily awakens.  No complaints.    Assessment & Plan:   Principal Problem: Right hip fracture -12/21-status post right hemiarthroplasty by Dr. Lorin Mercy -She will need to return to SNF  Active Problems:   Atrial fibrillation with RVR  -Initially started on Cardizem infusion - being managed by cardiology- -She has been changed to oral Cardizem which is short acting as her medications need to be crushed -Heart rate is well controlled today    Advanced dementia  With agitation-according to daughter who is at bedside, she does not plan to be aggressive with the patient but did want the surgery for her hip fracture to help control pain -Continue BuSpar, Xanax and Seroquel  Hypothyroidism -Continue Synthroid  Time spent in minutes: 35 DVT prophylaxis: Full dose aspirin Code Status: Not resuscitate Family Communication: Daughter at bedside Disposition Plan: Return to SNF Consultants:   Orthopedic surgery  Cardiology Procedures:  Right hip  hemiarthroplasty Antimicrobials:  Anti-infectives (From admission, onward)   Start     Dose/Rate Route Frequency Ordered Stop   02/12/19 1500  ceFAZolin (ANCEF) IVPB 2g/100 mL premix     2 g 200 mL/hr over 30 Minutes Intravenous To [facility] 02/12/19 1038 02/12/19 1515       Objective: Vitals:   02/13/19 2153 02/14/19 0421 02/14/19 0622 02/14/19 0623  BP: 116/73 101/83 106/73   Pulse: (!) 103 94 96   Resp:  18    Temp:  98.2 F (36.8 C)    TempSrc:  Axillary    SpO2:  96%    Weight:    41.3 kg  Height:        Intake/Output Summary (Last 24 hours) at 02/14/2019 1119 Last data filed at 02/14/2019 0900 Gross per 24 hour  Intake 882.09 ml  Output --  Net 882.09 ml   Filed Weights   02/12/19 0451 02/13/19 0415 02/14/19 0623  Weight: 42.9 kg 41.7 kg 41.3 kg    Examination: General exam: Appears comfortable  HEENT: PERRLA, oral mucosa moist, no sclera icterus or thrush Respiratory system: Clear to auscultation. Respiratory effort normal. Cardiovascular system: S1 & S2 heard,  No murmurs  Gastrointestinal system: Abdomen soft, non-tender, nondistended. Normal bowel sounds   Central nervous system: Alert -quite confused-moves all extremities Extremities: No cyanosis, clubbing or edema Skin: No rashes or ulcers Psychiatry:   Confused and restless at times   Data Reviewed: I have personally reviewed following labs and imaging studies  CBC: Recent Labs  Lab 02/11/19 0845 02/12/19 0332 02/13/19 0408 02/14/19 0956  WBC 9.2 7.7 11.1* 9.6  NEUTROABS 6.6  --   --   --  HGB 14.7 13.9 13.3 12.6  HCT 43.7 40.3 39.2 36.9  MCV 102.1* 98.8 100.8* 102.2*  PLT 144* 133* 128* 662*   Basic Metabolic Panel: Recent Labs  Lab 02/11/19 0845 02/12/19 0332 02/13/19 0408 02/14/19 0956  NA 139 142 139 144  K 4.3 3.9 4.1 4.0  CL 106 106 107 110  CO2 24 25 21* 23  GLUCOSE 122* 105* 129* 121*  BUN 22 18 14 21   CREATININE 0.64 0.77 0.62 0.89  CALCIUM 9.3 9.2 8.8* 8.9   MG  --  2.2  --   --    GFR: Estimated Creatinine Clearance: 25.2 mL/min (by C-G formula based on SCr of 0.89 mg/dL). Liver Function Tests: No results for input(s): AST, ALT, ALKPHOS, BILITOT, PROT, ALBUMIN in the last 168 hours. No results for input(s): LIPASE, AMYLASE in the last 168 hours. No results for input(s): AMMONIA in the last 168 hours. Coagulation Profile: No results for input(s): INR, PROTIME in the last 168 hours. Cardiac Enzymes: No results for input(s): CKTOTAL, CKMB, CKMBINDEX, TROPONINI in the last 168 hours. BNP (last 3 results) No results for input(s): PROBNP in the last 8760 hours. HbA1C: No results for input(s): HGBA1C in the last 72 hours. CBG: No results for input(s): GLUCAP in the last 168 hours. Lipid Profile: No results for input(s): CHOL, HDL, LDLCALC, TRIG, CHOLHDL, LDLDIRECT in the last 72 hours. Thyroid Function Tests: No results for input(s): TSH, T4TOTAL, FREET4, T3FREE, THYROIDAB in the last 72 hours. Anemia Panel: No results for input(s): VITAMINB12, FOLATE, FERRITIN, TIBC, IRON, RETICCTPCT in the last 72 hours. Urine analysis:    Component Value Date/Time   COLORURINE AMBER (A) 02/12/2019 1559   APPEARANCEUR CLOUDY (A) 02/12/2019 1559   LABSPEC 1.018 02/12/2019 1559   PHURINE 6.0 02/12/2019 1559   GLUCOSEU NEGATIVE 02/12/2019 1559   HGBUR SMALL (A) 02/12/2019 1559   BILIRUBINUR NEGATIVE 02/12/2019 1559   KETONESUR NEGATIVE 02/12/2019 1559   PROTEINUR 30 (A) 02/12/2019 1559   NITRITE NEGATIVE 02/12/2019 1559   LEUKOCYTESUR MODERATE (A) 02/12/2019 1559   Sepsis Labs: @LABRCNTIP (procalcitonin:4,lacticidven:4) ) Recent Results (from the past 240 hour(s))  Novel Coronavirus, NAA (Hosp order, Send-out to Ref Lab; TAT 18-24 hrs     Status: None   Collection Time: 02/07/19  5:28 PM  Result Value Ref Range Status   SARS-CoV-2, NAA NOT DETECTED NOT DETECTED Final    Comment: (NOTE) This nucleic acid amplification test was developed and  its performance characteristics determined by [employer]. Nucleic acid amplification tests include PCR and TMA. This test has not been FDA cleared or approved. This test has been authorized by FDA under an Emergency Use Authorization (EUA). This test is only authorized for the duration of time the declaration that circumstances exist justifying the authorization of the emergency use of in vitro diagnostic tests for detection of SARS-CoV-2 virus and/or diagnosis of COVID-19 infection under section 564(b)(1) of the Act, 21 U.S.C. 947MLY-6(T) (1), unless the authorization is terminated or revoked sooner. When diagnostic testing is negative, the possibility of a false negative result should be considered in the context of a patient's recent exposures and the presence of clinical signs and symptoms consistent with COVID-19. An individual without symptoms of COVID- 19 and who is not shedding SARS-CoV-2 vi rus would expect to have a negative (not detected) result in this assay. Performed At: [facility] [address] [place], [place] [id number] Rush Farmer MD EX:[id number]    [place]  Final    Comment:  Performed at [facility], [place] [address], [place], [place] [address]  SARS CORONAVIRUS 2 (TAT 6-24 HRS) Nasopharyngeal Nasopharyngeal Swab     Status: None   Collection Time: 02/11/19  8:48 AM   Specimen: Nasopharyngeal Swab  Result Value Ref Range Status   SARS Coronavirus 2 NEGATIVE NEGATIVE Final    Comment: (NOTE) SARS-CoV-2 target nucleic acids are NOT DETECTED. The SARS-CoV-2 RNA is generally detectable in upper and lower respiratory specimens during the acute phase of infection. Negative results do not preclude SARS-CoV-2 infection, do not rule out co-infections with other pathogens, and should not be used as the sole basis for treatment or other patient management decisions. Negative results must be combined with clinical  observations, patient history, and epidemiological information. The expected result is Negative. Fact Sheet for Patients: SugarRoll.be Fact Sheet for Healthcare Providers: https://www.woods-mathews.com/ This test is not yet approved or cleared by the [place] FDA and  has been authorized for detection and/or diagnosis of SARS-CoV-2 by FDA under an Emergency Use Authorization (EUA). This EUA will remain  in effect (meaning this test can be used) for the duration of the COVID-19 declaration under Section 56 4(b)(1) of the Act, 21 U.S.C. section 360bbb-3(b)(1), unless the authorization is terminated or revoked sooner. Performed at [facility], [place] [address] [place], [place] [address]   Surgical PCR screen     Status: Abnormal   Collection Time: 02/11/19 11:51 PM   Specimen: Nasal Mucosa; Nasal Swab  Result Value Ref Range Status   MRSA, PCR POSITIVE (A) NEGATIVE Final    Comment: CRITICAL RESULT CALLED TO, READ BACK BY AND VERIFIED WITH: RN, D. Carman Ching [id number] @0508  THANEY    Staphylococcus aureus POSITIVE (A) NEGATIVE Final    Comment: CRITICAL RESULT CALLED TO, READ BACK BY AND VERIFIED WITH: RN, D. GREESON [phone] @0508  THANEY Performed at [facility] [address], [place], [place] [address]          Radiology Studies: Pelvis Portable  Result Date: 02/12/2019 CLINICAL DATA:  Status post right hip replacement. EXAM: PORTABLE PELVIS 1-2 VIEWS COMPARISON:  Yesterday and 10/05/2018. FINDINGS: Interval right hip prosthesis in satisfactory position and alignment. No fracture or dislocation seen in the frontal projection. Overlying skin clips. Stable left hip prosthesis and diffuse osteopenia. IMPRESSION: Satisfactory postoperative appearance of a right hip prosthesis. Electronically Signed   By: Claudie Revering M.D.   On: 02/12/2019 17:03      Scheduled Meds: . ALPRAZolam  0.25 mg Oral QHS  . ascorbic acid  500 mg  Oral Daily  . aspirin  325 mg Oral Daily  . busPIRone  5 mg Oral BID  . Chlorhexidine Gluconate Cloth  6 each Topical Q0600  . diltiazem  90 mg Oral Q8H  . docusate sodium  100 mg Oral BID  . feeding supplement (PRO-STAT SUGAR FREE 64)  30 mL Oral BID BM  . levothyroxine  50 mcg Oral QAC breakfast  . mupirocin ointment  1 application Nasal BID  . QUEtiapine  12.5 mg Oral BID  . senna  2 tablet Oral Daily  . zinc sulfate  220 mg Oral Daily   Continuous Infusions: . lactated ringers 10 mL/hr at 02/12/19 1412     LOS: 3 days      Debbe Odea, MD [facility] Pager: [email or web address] Password [email or web address] 02/14/2019, 11:19 AM

## 2019-02-14 NOTE — Progress Notes (Signed)
Physical Therapy Treatment Patient Details Name: Casey Wood MRN: [id number] DOB: Jan 03, 1923 Today's Date: 02/14/2019    History of Present Illness 85yo female with history of L hip fracture and subsequent L anterior THA in August 2020. Now s/p fall at her facility, found to have R femoral neck fracture, and received R posterior THA on 02/12/19. PMH A-fib, CHF, dementia, HTN, lung CA, psychosis, seizures, TIA, lobectomy, B TKR, L THA    PT Comments    Patient received in bed, remains very confused and A&Ox0, quite confabulatory. Required totalAx2 for functional bed mobility, very strong posterior lean and only able to tolerate sitting at EOB with totalA for upright for less than 30 seconds this morning. She was left in bed positioned to comfort and with use of pillows and blankets for correct positioning of R LE. Continue to recommend SNF moving forward.     Follow Up Recommendations  SNF;Supervision/Assistance - 24 hour     Equipment Recommendations  Wheelchair (measurements PT);Wheelchair cushion (measurements PT);Hospital bed    Recommendations for Other Services       Precautions / Restrictions Precautions Precautions: Fall;Posterior Hip Precaution Booklet Issued: No Precaution Comments: Pt unable to carryover edu from baseline dementia Restrictions Weight Bearing Restrictions: Yes RLE Weight Bearing: Weight bearing as tolerated LLE Weight Bearing: Weight bearing as tolerated Other Position/Activity Restrictions: posterior precautions R LE/anterior precautions L LE    Mobility  Bed Mobility Overal bed mobility: Needs Assistance Bed Mobility: Supine to Sit;Sit to Supine     Supine to sit: Total assist;+2 for physical assistance Sit to supine: Total assist;+2 for physical assistance   General bed mobility comments: totalAx2 to perform bed mobility and maintain posterior hip precautions, totalA for balance due to strong posterior lean, only cooperated sitting at  EOB for less than 30 seconds  Transfers                 General transfer comment: deferred- physical resistance and guarding, high pain levels with attempted mobility  Ambulation/Gait             General Gait Details: deferred- physical resistance and guarding, high pain levels with attempted mobility   Stairs             Wheelchair Mobility    Modified Rankin (Stroke Patients Only)       Balance Overall balance assessment: Needs assistance Sitting-balance support: Feet supported;Bilateral upper extremity supported Sitting balance-Leahy Scale: Zero Sitting balance - Comments: totalA to maintain upright Postural control: Posterior lean                                  Cognition Arousal/Alertness: Awake/alert Behavior During Therapy: Restless Overall Cognitive Status: History of cognitive impairments - at baseline                                 General Comments: Pt unable to communicate accurately. pt not able to follow directions, A&Ox0, very confabulatory with garbled speech      Exercises      General Comments General comments (skin integrity, edema, etc.): found with R LE in IR position, repositioned with make-shift blanket wedge to keep R LE in neutral position/maintain precautions      Pertinent Vitals/Pain Pain Assessment: Faces Faces Pain Scale: Hurts whole lot Pain Location: B LE with movement Pain Descriptors / Indicators: Grimacing;Guarding Pain  Intervention(s): Limited activity within patient's tolerance;Monitored during session    Home Living                      Prior Function            PT Goals (current goals can now be found in the care plan section) Acute Rehab PT Goals Patient Stated Goal: unable PT Goal Formulation: Patient unable to participate in goal setting Time For Goal Achievement: 02/27/19 Potential to Achieve Goals: Fair Progress towards PT goals: Progressing toward  goals    Frequency    Min 3X/week      PT Plan Current plan remains appropriate    Co-evaluation              AM-PAC PT "6 Clicks" Mobility   Outcome Measure  Help needed turning from your back to your side while in a flat bed without using bedrails?: Total Help needed moving from lying on your back to sitting on the side of a flat bed without using bedrails?: Total Help needed moving to and from a bed to a chair (including a wheelchair)?: Total Help needed standing up from a chair using your arms (e.g., wheelchair or bedside chair)?: Total Help needed to walk in hospital room?: Total Help needed climbing 3-5 steps with a railing? : Total 6 Click Score: 6    End of Session   Activity Tolerance: Patient limited by pain;Other (comment)(limited by cognition) Patient left: in bed;with call bell/phone within reach;with bed alarm set Nurse Communication: Mobility status PT Visit Diagnosis: Muscle weakness (generalized) (M62.81);Difficulty in walking, not elsewhere classified (R26.2);Pain Pain - Right/Left: (bilateral) Pain - part of body: Hip     Time: 6812-7517 PT Time Calculation (min) (ACUTE ONLY): 15 min  Charges:  $Therapeutic Activity: 8-22 mins                     Windell Norfolk, DPT, PN1   Supplemental Physical Therapist [place]    Pager [phone] Acute Rehab Office [phone]

## 2019-02-14 NOTE — TOC Transition Note (Addendum)
Transition of Care [facility]) - CM/SW Discharge Note   Patient Details  Name: Casey Wood MRN: [id number] Date of Birth: 1922-11-22  Transition of [facility]) CM/SW Contact:  Alberteen Sam, LCSW Phone Number: 02/14/2019, 1:36 PM   Clinical Narrative:     Patient will DC to: [facility] Anticipated DC date: 02/14/2019 Family notified:Wendy Transport DG:UYQI  Per MD patient ready for DC to [facility] . RN, patient, patient's family, and facility notified of DC. Discharge Summary sent to facility. RN given number for report   [phone] Room 150. DC packet on chart. Ambulance transport requested for patient.  CSW signing off.  [place], [place]   Final next level of care: Skilled Nursing Facility Barriers to Discharge: No Barriers Identified   Patient Goals and CMS Choice Patient states their goals for this hospitalization and ongoing recovery are:: "for pt to return to [facility]" CMS Medicare.gov Compare Post Acute Care list provided to:: Patient Represenative (must comment)(daughter [name]) Choice offered to / list presented to : Adult Children  Discharge Placement PASRR number recieved: 02/12/19            Patient chooses bed at: [facility] Patient to be transferred to facility by: [place] Name of family member notified: [name] Patient and family notified of of transfer: 02/14/19  Discharge Plan and Services In-house Referral: NA   Post Acute Care Choice: [facility]                               Social Determinants of Health (SDOH) Interventions     Readmission Risk Interventions No flowsheet data found.

## 2019-02-14 NOTE — Discharge Summary (Signed)
Physician Discharge Summary  Casey Wood QIW:[id number] DOB: May 03, 1922 DOA: 02/11/2019  PCP: Hennie Duos, MD  Admit date: 02/11/2019 Discharge date: 02/14/2019  Admitted From: SNF Disposition:  SNF   Recommendations for Outpatient Follow-up:  1. F/u on oral intake   Discharge Condition:  stable   CODE STATUS:  DNR   Diet recommendation:  Regular diet Consultations:  Cardiology  Orthopedic surgery    Discharge Diagnoses:  Principal Problem:   Hip fracture ([place]) Active Problems:   Atrial fibrillation with RVR ([place])   Advanced dementia ([place])  Hypothyroidism   Brief Summary: Casey Wood is a 85 y.o.femalewith medical history significant foratrial fibrillation not on anticoagulation, recent left femoral neck fracture with arthroplasty on 10/05/2018 dementia with psychosis, CHF, seizures, hypothyroidism, and hypertension who was brought from her SNF due to a fall that was noticed approximately 3 days ago. At that time her right foot was wedged underneath her bed. She was noted to have a hip x-ray on 12/19 demonstrating a displaced subcapital right femoral fracture. sent to ED today for further evaluation. She cannotprovideany history herself as she has advanced dementia  -She was transferred from [facility] emergency room, was also noted to be in rapid A. fib in the ED and was started on a Cardizem drip   Hospital Course:  Principal Problem: Right hip fracture -12/21-status post right hemiarthroplasty by Dr. Lorin Mercy -She will need to return to SNF  Active Problems:   Atrial fibrillation with RVR  -Initially started on Cardizem infusion - being managed by cardiology- -She has been changed to oral Cardizem which is short acting as her medications need to be crushed -Heart rate is well controlled today    Advanced dementia  With agitation-according to daughter who is at bedside, she does not plan to be aggressive with the patient but did want the  surgery for her hip fracture to help control pain -Continue BuSpar, Xanax and Seroquel  Hypothyroidism -Continue Synthroid   Discharge Exam: Vitals:   02/14/19 0421 02/14/19 0622  BP: 101/83 106/73  Pulse: 94 96  Resp: 18   Temp: 98.2 F (36.8 C)   SpO2: 96%    Vitals:   02/13/19 2153 02/14/19 0421 02/14/19 0622 02/14/19 0623  BP: 116/73 101/83 106/73   Pulse: (!) 103 94 96   Resp:  18    Temp:  98.2 F (36.8 C)    TempSrc:  Axillary    SpO2:  96%    Weight:    41.3 kg  Height:        General: Pt is alert, awake, not in acute distress Cardiovascular: RRR, S1/S2 +, no rubs, no gallops Respiratory: CTA bilaterally, no wheezing, no rhonchi Abdominal: Soft, NT, ND, bowel sounds + Extremities: no edema, no cyanosis   Discharge Instructions  Discharge Instructions    Diet - low sodium heart healthy   Complete by: As directed    Increase activity slowly   Complete by: As directed      Allergies as of 02/14/2019      Reactions   Levofloxacin    unknown      Medication List    STOP taking these medications   diltiazem 240 MG 24 hr capsule Commonly known as: TIAZAC     TAKE these medications   ALPRAZolam 0.25 MG tablet Commonly known as: XANAX Take 1 tablet (0.25 mg total) by mouth at bedtime.   ascorbic acid 500 MG tablet Commonly known as: VITAMIN C Take 500  mg by mouth daily.   aspirin 325 MG tablet Take 1 tablet (325 mg total) by mouth daily. Start taking on: February 15, 2019   bisacodyl 10 MG suppository Commonly known as: DULCOLAX Place 1 suppository (10 mg total) rectally daily as needed for moderate constipation.   busPIRone 5 MG tablet Commonly known as: BUSPAR Take 5 mg by mouth 2 (two) times daily.   Cholecalciferol 1.25 MG (50000 UT) capsule Take 50,000 Units by mouth. Once a day on Thursday   diltiazem 90 MG tablet Commonly known as: CARDIZEM Take 1 tablet (90 mg total) by mouth every 8 (eight) hours.   docusate 50 MG/5ML  liquid Commonly known as: COLACE Take 10 mLs (100 mg total) by mouth 2 (two) times daily.   feeding supplement (PRO-STAT SUGAR FREE 64) Liqd Take 30 mLs by mouth 2 (two) times daily between meals.   levothyroxine 50 MCG tablet Commonly known as: SYNTHROID Take 50 mcg by mouth daily before breakfast.   NON FORMULARY Magic cup daily- for increased nutrition intake Once A Day 02:00 PM   NON FORMULARY Diet Change: Dysphagia 1 (puree), thin liquids   OcuSoft Eyelid Cleansing Pads Apply 1 application topically 2 (two) times daily. cleanse both eyes BID Twice A Day 09:00 AM, 09:00 PM   polyethylene glycol 17 g packet Commonly known as: MIRALAX / GLYCOLAX Take 17 g by mouth daily as needed for mild constipation.   senna 8.6 MG Tabs tablet Commonly known as: SENOKOT Take 2 tablets (17.2 mg total) by mouth daily. Start taking on: February 15, 2019   SEROQUEL PO Take 12.5 mg by mouth 2 (two) times daily.   traMADol 50 MG tablet Commonly known as: ULTRAM Take 1 tablet (50 mg total) by mouth every 8 (eight) hours.   Venelex Oint Apply 1 application topically See admin instructions. Apply to sacrum, coccyx, and bilateral buttocks every shift and as needed for wound   zinc sulfate 220 (50 Zn) MG capsule Take 220 mg by mouth daily.       Allergies  Allergen Reactions  . Levofloxacin     unknown     Procedures/Studies:   right hemiarthroplasty  DG Chest 1 View  Result Date: 02/11/2019 CLINICAL DATA:  Femur fracture EXAM: CHEST  1 VIEW COMPARISON:  10/03/2018 FINDINGS: Borderline heart size with aortic tortuosity distorted by leftward rotation. Anterior right fourth rib sclerotic focus also seen on prior, likely remote fracture or bone island in isolation. Remote left humeral neck fracture. Midthoracic compression fractures not primarily evaluated. There is no edema, consolidation, effusion, or pneumothorax. IMPRESSION: No evidence of acute cardiopulmonary disease.  Electronically Signed   By: Monte Fantasia M.D.   On: 02/11/2019 09:46   Pelvis Portable  Result Date: 02/12/2019 CLINICAL DATA:  Status post right hip replacement. EXAM: PORTABLE PELVIS 1-2 VIEWS COMPARISON:  Yesterday and 10/05/2018. FINDINGS: Interval right hip prosthesis in satisfactory position and alignment. No fracture or dislocation seen in the frontal projection. Overlying skin clips. Stable left hip prosthesis and diffuse osteopenia. IMPRESSION: Satisfactory postoperative appearance of a right hip prosthesis. Electronically Signed   By: Claudie Revering M.D.   On: 02/12/2019 17:03   DG HIP UNILAT WITH PELVIS 2-3 VIEWS RIGHT  Result Date: 02/11/2019 CLINICAL DATA:  Fall with femur fracture EXAM: DG HIP (WITH OR WITHOUT PELVIS) 2-3V RIGHT COMPARISON:  12/26/2018 FINDINGS: Right femoral neck fracture with moderate displacement, subcapital in appearance. Left hip hemiarthroplasty and remote left obturator ring fracture. The bones are osteopenic. Prominent  stool volume over the pelvis. IMPRESSION: Displaced right femoral neck fracture. Electronically Signed   By: Monte Fantasia M.D.   On: 02/11/2019 09:45     The results of significant diagnostics from this hospitalization (including imaging, microbiology, ancillary and laboratory) are listed below for reference.     Microbiology: Recent Results (from the past 240 hour(s))  Novel Coronavirus, NAA (Hosp order, Send-out to Ref Lab; TAT 18-24 hrs     Status: None   Collection Time: 02/07/19  5:28 PM  Result Value Ref Range Status   SARS-CoV-2, NAA NOT DETECTED NOT DETECTED Final    Comment: (NOTE) This nucleic acid amplification test was developed and its performance characteristics determined by [employer]. Nucleic acid amplification tests include PCR and TMA. This test has not been FDA cleared or approved. This test has been authorized by FDA under an Emergency Use Authorization (EUA). This test is only authorized for the  duration of time the declaration that circumstances exist justifying the authorization of the emergency use of in vitro diagnostic tests for detection of SARS-CoV-2 virus and/or diagnosis of COVID-19 infection under section 564(b)(1) of the Act, 21 U.S.C. 992EQA-8(T) (1), unless the authorization is terminated or revoked sooner. When diagnostic testing is negative, the possibility of a false negative result should be considered in the context of a patient's recent exposures and the presence of clinical signs and symptoms consistent with COVID-19. An individual without symptoms of COVID- 19 and who is not shedding SARS-CoV-2 vi rus would expect to have a negative (not detected) result in this assay. Performed At: [facility] [address] [place], [place] [id number] Rush Farmer MD LG:[id number]    [place]  Final    Comment: Performed at [facility], [place] [address], [place], [place] [address]  SARS CORONAVIRUS 2 (TAT 6-24 HRS) Nasopharyngeal Nasopharyngeal Swab     Status: None   Collection Time: 02/11/19  8:48 AM   Specimen: Nasopharyngeal Swab  Result Value Ref Range Status   SARS Coronavirus 2 NEGATIVE NEGATIVE Final    Comment: (NOTE) SARS-CoV-2 target nucleic acids are NOT DETECTED. The SARS-CoV-2 RNA is generally detectable in upper and lower respiratory specimens during the acute phase of infection. Negative results do not preclude SARS-CoV-2 infection, do not rule out co-infections with other pathogens, and should not be used as the sole basis for treatment or other patient management decisions. Negative results must be combined with clinical observations, patient history, and epidemiological information. The expected result is Negative. Fact Sheet for Patients: SugarRoll.be Fact Sheet for Healthcare Providers: https://www.woods-mathews.com/ This test is not yet approved or cleared by the  [place] FDA and  has been authorized for detection and/or diagnosis of SARS-CoV-2 by FDA under an Emergency Use Authorization (EUA). This EUA will remain  in effect (meaning this test can be used) for the duration of the COVID-19 declaration under Section 56 4(b)(1) of the Act, 21 U.S.C. section 360bbb-3(b)(1), unless the authorization is terminated or revoked sooner. Performed at [facility], [place] [address], [place], [place] [address]   Surgical PCR screen     Status: Abnormal   Collection Time: 02/11/19 11:51 PM   Specimen: Nasal Mucosa; Nasal Swab  Result Value Ref Range Status   MRSA, PCR POSITIVE (A) NEGATIVE Final    Comment: CRITICAL RESULT CALLED TO, READ BACK BY AND VERIFIED WITH: RN, D. Carman Ching [id number] @0508  THANEY    Staphylococcus aureus POSITIVE (A) NEGATIVE Final    Comment: CRITICAL  RESULT CALLED TO, READ BACK BY AND VERIFIED WITH: RNCelso Amy [phone] @0508  [facility] Performed at [place] [address] [place], [place] [address]      Labs: BNP (last 3 results) No results for input(s): BNP in the last 8760 hours. Basic Metabolic Panel: Recent Labs  Lab 02/11/19 0845 02/12/19 0332 02/13/19 0408 02/14/19 0956  NA 139 142 139 144  K 4.3 3.9 4.1 4.0  CL 106 106 107 110  CO2 24 25 21* 23  GLUCOSE 122* 105* 129* 121*  BUN 22 18 14 21   CREATININE 0.64 0.77 0.62 0.89  CALCIUM 9.3 9.2 8.8* 8.9  MG  --  2.2  --   --    Liver Function Tests: No results for input(s): AST, ALT, ALKPHOS, BILITOT, PROT, ALBUMIN in the last 168 hours. No results for input(s): LIPASE, AMYLASE in the last 168 hours. No results for input(s): AMMONIA in the last 168 hours. CBC: Recent Labs  Lab 02/11/19 0845 02/12/19 0332 02/13/19 0408 02/14/19 0956  WBC 9.2 7.7 11.1* 9.6  NEUTROABS 6.6  --   --   --   HGB 14.7 13.9 13.3 12.6  HCT 43.7 40.3 39.2 36.9  MCV 102.1* 98.8 100.8* 102.2*  PLT 144* 133* 128* 145*   Cardiac Enzymes: No results for input(s):  CKTOTAL, CKMB, CKMBINDEX, TROPONINI in the last 168 hours. BNP: Invalid input(s): POCBNP CBG: No results for input(s): GLUCAP in the last 168 hours. D-Dimer No results for input(s): DDIMER in the last 72 hours. Hgb A1c No results for input(s): HGBA1C in the last 72 hours. Lipid Profile No results for input(s): CHOL, HDL, LDLCALC, TRIG, CHOLHDL, LDLDIRECT in the last 72 hours. Thyroid function studies No results for input(s): TSH, T4TOTAL, T3FREE, THYROIDAB in the last 72 hours.  Invalid input(s): FREET3 Anemia work up No results for input(s): VITAMINB12, FOLATE, FERRITIN, TIBC, IRON, RETICCTPCT in the last 72 hours. Urinalysis    Component Value Date/Time   COLORURINE AMBER (A) 02/12/2019 1559   APPEARANCEUR CLOUDY (A) 02/12/2019 1559   LABSPEC 1.018 02/12/2019 1559   PHURINE 6.0 02/12/2019 1559   GLUCOSEU NEGATIVE 02/12/2019 1559   HGBUR SMALL (A) 02/12/2019 1559   BILIRUBINUR NEGATIVE 02/12/2019 1559   KETONESUR NEGATIVE 02/12/2019 1559   PROTEINUR 30 (A) 02/12/2019 1559   NITRITE NEGATIVE 02/12/2019 1559   LEUKOCYTESUR MODERATE (A) 02/12/2019 1559   Sepsis Labs Invalid input(s): PROCALCITONIN,  WBC,  LACTICIDVEN Microbiology Recent Results (from the past 240 hour(s))  Novel Coronavirus, NAA (Hosp order, Send-out to Ref Lab; TAT 18-24 hrs     Status: None   Collection Time: 02/07/19  5:28 PM  Result Value Ref Range Status   SARS-CoV-2, NAA NOT DETECTED NOT DETECTED Final    Comment: (NOTE) This nucleic acid amplification test was developed and its performance characteristics determined by [employer], [employer] and [employer]. Nucleic acid amplification tests include PCR and TMA. This test has not been FDA cleared or approved. This test has been authorized by FDA under an Emergency Use Authorization (EUA). This test is only authorized for the duration of time the declaration that circumstances exist justifying the authorization of the emergency use of in vitro diagnostic tests for  detection of SARS-CoV-2 virus and/or diagnosis of COVID-19 infection under section 564(b)(1) of the Act, 21 U.S.C. 858IFO-2(D) (1), unless the authorization is terminated or revoked sooner. When diagnostic testing is negative, the possibility of a false negative result should be considered in the context of a patient's recent exposures and the  presence of clinical signs and symptoms consistent with COVID-19. An individual without symptoms of COVID- 19 and who is not shedding SARS-CoV-2 vi rus would expect to have a negative (not detected) result in this assay. Performed At: [facility] [address] [place], [place] [id number] Rush Farmer MD DG:[id number]    [place]  Final    Comment: Performed at [facility], [place] [address] [place], [place] [address]  SARS CORONAVIRUS 2 (TAT 6-24 HRS) Nasopharyngeal Nasopharyngeal Swab     Status: None   Collection Time: 02/11/19  8:48 AM   Specimen: Nasopharyngeal Swab  Result Value Ref Range Status   SARS Coronavirus 2 NEGATIVE NEGATIVE Final    Comment: (NOTE) SARS-CoV-2 target nucleic acids are NOT DETECTED. The SARS-CoV-2 RNA is generally detectable in upper and lower respiratory specimens during the acute phase of infection. Negative results do not preclude SARS-CoV-2 infection, do not rule out co-infections with other pathogens, and should not be used as the sole basis for treatment or other patient management decisions. Negative results must be combined with clinical observations, patient history, and epidemiological information. The expected result is Negative. Fact Sheet for Patients: SugarRoll.be Fact Sheet for Healthcare Providers: https://www.woods-mathews.com/ This test is not yet approved or cleared by the [place] FDA and  has been authorized for detection and/or diagnosis of SARS-CoV-2 by FDA under an Emergency Use Authorization (EUA).  This EUA will remain  in effect (meaning this test can be used) for the duration of the COVID-19 declaration under Section 56 4(b)(1) of the Act, 21 U.S.C. section 360bbb-3(b)(1), unless the authorization is terminated or revoked sooner. Performed at [facility], [place] [address], [place], [place] [address]   Surgical PCR screen     Status: Abnormal   Collection Time: 02/11/19 11:51 PM   Specimen: Nasal Mucosa; Nasal Swab  Result Value Ref Range Status   MRSA, PCR POSITIVE (A) NEGATIVE Final    Comment: CRITICAL RESULT CALLED TO, READ BACK BY AND VERIFIED WITH: RN, D. Carman Ching [id number] @0508  THANEY    Staphylococcus aureus POSITIVE (A) NEGATIVE Final    Comment: CRITICAL RESULT CALLED TO, READ BACK BY AND VERIFIED WITH: RN, D. [place] [phone] @0508  THANEY Performed at [place] [address] [place], [place] [address]      Time coordinating discharge in minutes: 73  SIGNED:   Debbe Odea, MD  [facility] 02/14/2019, 12:22 PM Pager   If 7PM-7AM, please contact night-coverage [email or web address] Password [email or web address]

## 2019-02-15 ENCOUNTER — Non-Acute Institutional Stay (SKILLED_NURSING_FACILITY): Payer: Medicare HMO | Admitting: Adult Health

## 2019-02-15 ENCOUNTER — Other Ambulatory Visit: Payer: Self-pay | Admitting: Adult Health

## 2019-02-15 ENCOUNTER — Encounter: Payer: Self-pay | Admitting: Adult Health

## 2019-02-15 DIAGNOSIS — F0391 Unspecified dementia with behavioral disturbance: Secondary | ICD-10-CM

## 2019-02-15 DIAGNOSIS — I4891 Unspecified atrial fibrillation: Secondary | ICD-10-CM | POA: Diagnosis not present

## 2019-02-15 DIAGNOSIS — S42215S Unspecified nondisplaced fracture of surgical neck of left humerus, sequela: Secondary | ICD-10-CM | POA: Diagnosis not present

## 2019-02-15 DIAGNOSIS — F03C0 Unspecified dementia, severe, without behavioral disturbance, psychotic disturbance, mood disturbance, and anxiety: Secondary | ICD-10-CM

## 2019-02-15 DIAGNOSIS — R569 Unspecified convulsions: Secondary | ICD-10-CM

## 2019-02-15 DIAGNOSIS — F419 Anxiety disorder, unspecified: Secondary | ICD-10-CM

## 2019-02-15 DIAGNOSIS — S72001A Fracture of unspecified part of neck of right femur, initial encounter for closed fracture: Secondary | ICD-10-CM

## 2019-02-15 DIAGNOSIS — E039 Hypothyroidism, unspecified: Secondary | ICD-10-CM

## 2019-02-15 DIAGNOSIS — I509 Heart failure, unspecified: Secondary | ICD-10-CM

## 2019-02-15 DIAGNOSIS — S72002A Fracture of unspecified part of neck of left femur, initial encounter for closed fracture: Secondary | ICD-10-CM

## 2019-02-15 DIAGNOSIS — F03918 Unspecified dementia, unspecified severity, with other behavioral disturbance: Secondary | ICD-10-CM

## 2019-02-15 DIAGNOSIS — F0390 Unspecified dementia without behavioral disturbance: Secondary | ICD-10-CM

## 2019-02-15 MED ORDER — TRAMADOL HCL 50 MG PO TABS: 50.0000 mg | ORAL_TABLET | Freq: Three times a day (TID) | ORAL | 0 refills | Status: DC

## 2019-02-15 MED ORDER — ALPRAZOLAM 0.25 MG PO TABS: 0.2500 mg | ORAL_TABLET | Freq: Every day | ORAL | 0 refills | Status: DC

## 2019-02-15 NOTE — Progress Notes (Signed)
Location:    [facility] Room Number: 150/P Place of Service:  SNF (31)   CODE STATUS: DNR  Allergies  Allergen Reactions  . Levofloxacin     unknown    Chief Complaint  Patient presents with  . Hospitalization Follow-up    Hospitalization Follow Up    HPI:  Casey Wood is a 85 year old long term resident of this facility who has been hospitalized from 02-11-19 through 02-14-19. Casey Wood was treated for a right hip fracture and afib with rvr. There are no reports of uncontrolled pain; no reports of changes in appetite; no reports of fevers. Casey Wood will continue to be followed for her chronic illnesses including: dementia; afib; hypothyroidism   Past Medical History:  Diagnosis Date  . Alcohol abuse 02/07/2018  . Atrial fibrillation ([place])   . Atrial fibrillation, permanent ([place]) 02/13/2019  . CHF (congestive heart failure) ([place]) 02/07/2018  . Dementia ([place]) 02/07/2018  . Dysphagia   . GERD (gastroesophageal reflux disease)   . High cholesterol   . Hypertension   . Hypokalemia   . Lung cancer ([place])   . Psychosis ([place]) 04/08/2018  . Seizures ([place]) 02/07/2018  . Thyroid disease   . TIA (transient ischemic attack)     Past Surgical History:  Procedure Laterality Date  . cardiac implants and grafts    . HIP ARTHROPLASTY Right 02/12/2019   Procedure: ARTHROPLASTY BIPOLAR HIP (HEMIARTHROPLASTY);  Surgeon: Marybelle Killings, MD;  Location: [facility];  Service: Orthopedics;  Laterality: Right;  . LOBECTOMY    . REPLACEMENT TOTAL KNEE Bilateral   . TOTAL HIP ARTHROPLASTY Left 10/05/2018   Procedure: HEMI HIP ARTHROPLASTY ANTERIOR APPROACH;  Surgeon: Leandrew Koyanagi, MD;  Location: [facility];  Service: Orthopedics;  Laterality: Left;    Social History   Socioeconomic History  . Marital status: Widowed    Spouse name: Not on file  . Number of children: Not on file  . Years of education: Not on file  . Highest education level: Not on file  Occupational History  . Occupation:  retired   Tobacco Use  . Smoking status: Never Smoker  . Smokeless tobacco: Never Used  Substance and Sexual Activity  . Alcohol use: Not Currently  . Drug use: No  . Sexual activity: Not Currently  Other Topics Concern  . Not on file  Social History Narrative   Long term resident of [facility]   Unable to participate in questions    Social Determinants of Health   Financial Resource Strain:   . Difficulty of Paying Living Expenses: Not on file  Food Insecurity:   . Worried About Charity fundraiser in the Last Year: Not on file  . Ran Out of Food in the Last Year: Not on file  Transportation Needs:   . Lack of Transportation (Medical): Not on file  . Lack of Transportation (Non-Medical): Not on file  Physical Activity:   . Days of Exercise per Week: Not on file  . Minutes of Exercise per Session: Not on file  Stress:   . Feeling of Stress : Not on file  Social Connections:   . Frequency of Communication with Friends and Family: Not on file  . Frequency of Social Gatherings with Friends and Family: Not on file  . Attends Religious Services: Not on file  . Active Member of Clubs or Organizations: Not on file  . Attends Archivist Meetings: Not on file  . Marital Status: Not on  file  Intimate Partner Violence:   . Fear of Current or Ex-Partner: Not on file  . Emotionally Abused: Not on file  . Physically Abused: Not on file  . Sexually Abused: Not on file   Family History  Problem Relation Age of Onset  . Heart attack Father        21s      VITAL SIGNS BP 113/61   Pulse 74   Temp 98.1 F (36.7 C) (Oral)   Resp 20   Ht 5\' 2"  (1.575 m)   Wt 92 lb 11.2 oz (42 kg)   BMI 16.96 kg/m   Outpatient Encounter Medications as of 02/15/2019  Medication Sig  . ALPRAZolam (XANAX) 0.25 MG tablet Take 1 tablet (0.25 mg total) by mouth at bedtime.  . Amino Acids-Protein Hydrolys (FEEDING SUPPLEMENT, PRO-STAT SUGAR FREE 64,) LIQD Take 30 mLs by mouth 2 (two) times daily  between meals.  Marland Kitchen aspirin 325 MG tablet Take 1 tablet (325 mg total) by mouth daily.  Roseanne Kaufman Peru-Castor Oil (VENELEX) OINT Apply 1 application topically See admin instructions. Apply to sacrum, coccyx, and bilateral buttocks every shift and as needed for wound  . bisacodyl (DULCOLAX) 10 MG suppository Place 1 suppository (10 mg total) rectally daily as needed for moderate constipation.  . busPIRone (BUSPAR) 5 MG tablet Take 5 mg by mouth 2 (two) times daily.  . Cholecalciferol 1.25 MG (50000 UT) capsule Take 50,000 Units by mouth. Once a day on Thursday  . diltiazem (CARDIZEM) 90 MG tablet Take 1 tablet (90 mg total) by mouth every 8 (eight) hours.  . docusate (COLACE) 50 MG/5ML liquid Take 10 mLs (100 mg total) by mouth 2 (two) times daily.  . Eyelid Cleansers (OCUSOFT EYELID CLEANSING) PADS Apply 1 application topically 2 (two) times daily. cleanse both eyes BID Twice A Day 09:00 AM, 09:00 PM   . levothyroxine (SYNTHROID, LEVOTHROID) 50 MCG tablet Take 50 mcg by mouth daily before breakfast.  . NON FORMULARY Diet Change: Dysphagia 1 (puree), thin liquids  . NON FORMULARY Magic cup daily- for increased nutrition intake Once A Day 02:00 PM  . polyethylene glycol (MIRALAX / GLYCOLAX) 17 g packet Take 17 g by mouth daily as needed for mild constipation.  . QUEtiapine Fumarate (SEROQUEL PO) Take 12.5 mg by mouth 2 (two) times daily.   Marland Kitchen senna (SENOKOT) 8.6 MG TABS tablet Take 2 tablets (17.2 mg total) by mouth daily.  . traMADol (ULTRAM) 50 MG tablet Take 1 tablet (50 mg total) by mouth every 8 (eight) hours.  Marland Kitchen zinc sulfate 220 (50 Zn) MG capsule Take 220 mg by mouth daily.   No facility-administered encounter medications on file as of 02/15/2019.     SIGNIFICANT DIAGNOSTIC EXAMS   PREVIOUS    10-03-18: left hip and pelvis x-ray: Mildly comminuted, foreshortened and slightly valgus angulated transcervical left femoral neck fracture. Remote appearing posttraumatic deformity of the  left inferior and superior pubic ramus  10-03-18: chest x-ray:  1. No active disease.  Likely chronic coarse interstitial changes. 2. Question projectional irregularity versus fracture of the left humerus. Correlate with point tenderness and consider dedicated shoulder radiographs if there is clinical concern  10-03-18: left humerus x-ray: Age indeterminate fracture of the left humeral neck. Clinical correlation is recommended.  10-05-18: pelvic x-ray:  1. Interval left hip replacement with expected surgical change 2. Chronic deformities of the left superior and inferior pubic rami  NO NEW EXAMS.   02-11-19: chest x-ray: No evidence of  acute cardiopulmonary disease   02-11-19: right hip x-ray: Displaced right femoral neck fracture.     LABS REVIEWED PREVIOUS;   03-17-18: wbc 5.7; hgb 12.4; hct 38.8; mcv 108.1; plt 224; glucose 85; bun 13; creat 0.91; k+ 3.3; na++ 138; ca 8.7 protein 5.7 albumin 3.1 hgb 4.8 03-20-18: glucose 74; bun 14; creat 0.81; k+ 3.5; na++ 137; ca 9.2 04-19-28: tsh 93.852 05-18-18: tsh 31.786 06-29-18: vit B 12: 308; tsh 14.789 07-11-18: wbc 4.7; hgb 12.9; hct 39.1; mcv 104.5; plt 146 ; glucose 89; bun 24; creat 0.74; k+ 3.9; na++ 141; ca 9.0 liver normal albumin 3.2 tsh 13.945 free T4: 0.78 08-01-18: tsh 6.297 free T3; 2.2 free T4: 1.13 10-03-18: wbc 8.4; hgb 15.6; hct 48.0; mcv 99.2 plt 160; glucose 124; bun 30; creat 0.81; k+ 4.3; na++ 142; ca 9.6 10-06-18: wbc 9.9; hgb 15.3; hct 46.1; mcv 99.1; plt 121; glucose 134; bun 15; creat 0.66; k+ 3.8; na++ 140; ca 8.8 10-09-18: wbc 7.8; hgb 13.2; hct 39.0; mcv 98.7 plt 135 01-26-19: wbc 5.8; hgb 13.6; hct  42.3; mcv 104.7 plt 145; iron 129 tibc 377; vit B 12: 392; folate 6.6; vit B 1: 90.0  TODAY   02-11-19: wbc 9.2; hgb 14.7; hct 43.7;mcv 102.1 plt 144; glucose 122; bun 22; creat 0.64; k+ 4.3; na++ 139; ca 9.3 02-14-19: wbc 9.6; hgb 12.6; hct 36.9; mcv 102.2 plt 145; glucose 121; bun 21; creat 0.89; k+ 4.0; na++ 144;ca 8.9      Review of Systems  Unable to perform ROS: Dementia (unable to participate )    Physical Exam Constitutional:      General: Casey Wood is not in acute distress.    Appearance: Casey Wood is cachectic. Casey Wood is not diaphoretic.  Neck:     Thyroid: No thyromegaly.  Cardiovascular:     Rate and Rhythm: Normal rate and regular rhythm.     Pulses: Normal pulses.     Heart sounds: Normal heart sounds.     Comments: History of cardiac stent Pulmonary:     Effort: Pulmonary effort is normal. No respiratory distress.     Breath sounds: Normal breath sounds.  Abdominal:     General: Bowel sounds are normal. There is no distension.     Palpations: Abdomen is soft.     Tenderness: There is no abdominal tenderness.  Genitourinary:    Comments: Foley  Musculoskeletal:     Cervical back: Neck supple.     Right lower leg: No edema.     Left lower leg: No edema.     Comments: Is able to move all extremities  Is status post right hip hemi-arthroplasty 02-12-19 Is status post left hip hemi arthroplasty 10-05-18 Is status post left humeral fracture History of bilateral knee replacements    Lymphadenopathy:     Cervical: No cervical adenopathy.  Skin:    General: Skin is warm and dry.     Comments: Right hip incision line without signs of infection present   Neurological:     Mental Status: Casey Wood is alert. Mental status is at baseline.  Psychiatric:        Mood and Affect: Mood normal.        ASSESSMENT/ PLAN:  TODAY;   1. Closed displaced right femoral neck is stable her pain is being managed with ultram 50 mg three times daily   2.  Atrial fibrillation with RVR: heart rate is stable will continue cardizem 90 mg three times daily   3. Closed nondisplaced  fracture of surgical neck of left humerus unspecified morphology/closed displaced fracture of left femoral neck is stable is on ultram 50 mg three times daily   4. Acquired hypothyroidism: is stable tsh 6.297 (free t3: 2.2 free t4: 1.13)  will continue synthroid 50 mcg daily   5. Advanced dementia: is no change in status; weight is 92 pounds will monitor weight loss is an unfortunate outcome with the late stages of this disease process  6. Seizure is stable no recent seizures will monitor   7. Psychosis in elderly with behavioral disturbance: is stable will continue seroquel 12.5 mg twice daily   8 . Chronic anxiety: is stable will continue buspar 5 mg twice daily and xanax 0.25 mg nightly   9. Unspecified chronic CHF (Congestive heart failure) is compensated will monitor      MD is aware of resident's narcotic use and is in agreement with current plan of care. We will attempt to wean resident as appropriate.  Ok Edwards NP [facility]  Contact [phone] Monday through Friday 8am- 5pm  After hours call [phone]

## 2019-02-18 ENCOUNTER — Other Ambulatory Visit (HOSPITAL_COMMUNITY)
Admission: RE | Admit: 2019-02-18 | Discharge: 2019-02-18 | Disposition: A | Discharge status: Home / Self Care | Payer: Medicare HMO | Source: Ambulatory Visit | Attending: Adult Health | Admitting: Adult Health

## 2019-02-18 DIAGNOSIS — Z20828 Contact with and (suspected) exposure to other viral communicable diseases: Secondary | ICD-10-CM | POA: Insufficient documentation

## 2019-02-19 ENCOUNTER — Non-Acute Institutional Stay (SKILLED_NURSING_FACILITY): Payer: Medicare HMO | Admitting: Adult Health

## 2019-02-19 ENCOUNTER — Other Ambulatory Visit: Payer: Self-pay | Admitting: Adult Health

## 2019-02-19 ENCOUNTER — Telehealth: Payer: Self-pay

## 2019-02-19 ENCOUNTER — Encounter (HOSPITAL_COMMUNITY)
Admission: RE | Admit: 2019-02-19 | Discharge: 2019-02-19 | Disposition: A | Discharge status: Home / Self Care | Payer: Medicare HMO | Source: Ambulatory Visit | Attending: Internal Medicine | Admitting: Internal Medicine

## 2019-02-19 ENCOUNTER — Encounter: Payer: Self-pay | Admitting: Adult Health

## 2019-02-19 DIAGNOSIS — J189 Pneumonia, unspecified organism: Secondary | ICD-10-CM | POA: Diagnosis not present

## 2019-02-19 DIAGNOSIS — R509 Fever, unspecified: Secondary | ICD-10-CM | POA: Insufficient documentation

## 2019-02-19 DIAGNOSIS — L03115 Cellulitis of right lower limb: Secondary | ICD-10-CM

## 2019-02-19 LAB — CBC WITH DIFFERENTIAL/PLATELET
Abs Immature Granulocytes: 0.12 10*3/uL — ABNORMAL HIGH (ref 0.00–0.07)
Basophils Absolute: 0.1 10*3/uL (ref 0.0–0.1)
Basophils Relative: 1 %
Eosinophils Absolute: 0.1 10*3/uL (ref 0.0–0.5)
Eosinophils Relative: 1 %
HCT: 40.6 % (ref 36.0–46.0)
Hemoglobin: 13.5 g/dL (ref 12.0–15.0)
Immature Granulocytes: 1 %
Lymphocytes Relative: 14 %
Lymphs Abs: 1.4 10*3/uL (ref 0.7–4.0)
MCH: 34.3 pg — ABNORMAL HIGH (ref 26.0–34.0)
MCHC: 33.3 g/dL (ref 30.0–36.0)
MCV: 103 fL — ABNORMAL HIGH (ref 80.0–100.0)
Monocytes Absolute: 0.7 10*3/uL (ref 0.1–1.0)
Monocytes Relative: 7 %
Neutro Abs: 7.4 10*3/uL (ref 1.7–7.7)
Neutrophils Relative %: 76 %
Platelets: 227 10*3/uL (ref 150–400)
RBC: 3.94 MIL/uL (ref 3.87–5.11)
RDW: 14.3 % (ref 11.5–15.5)
WBC: 9.8 10*3/uL (ref 4.0–10.5)
nRBC: 0 % (ref 0.0–0.2)

## 2019-02-19 LAB — BASIC METABOLIC PANEL
Anion gap: 15 (ref 5–15)
BUN: 16 mg/dL (ref 8–23)
CO2: 23 mmol/L (ref 22–32)
Calcium: 9.5 mg/dL (ref 8.9–10.3)
Chloride: 107 mmol/L (ref 98–111)
Creatinine, Ser: 0.76 mg/dL (ref 0.44–1.00)
GFR calc Af Amer: >60 mL/min (ref >60)
GFR calc non Af Amer: >60 mL/min (ref >60)
Glucose, Bld: 97 mg/dL (ref 70–99)
Potassium: 3.3 mmol/L — ABNORMAL LOW (ref 3.5–5.1)
Sodium: 145 mmol/L (ref 135–145)

## 2019-02-19 LAB — NOVEL CORONAVIRUS, NAA (HOSP ORDER, SEND-OUT TO REF LAB; TAT 18-24 HRS): SARS-CoV-2, NAA: NOT DETECTED

## 2019-02-19 MED ORDER — TRAMADOL HCL 50 MG PO TABS: 100.0000 mg | ORAL_TABLET | Freq: Three times a day (TID) | ORAL | 0 refills | Status: DC

## 2019-02-19 NOTE — Telephone Encounter (Signed)
Please advise 

## 2019-02-19 NOTE — Telephone Encounter (Signed)
Rhonda with [facility] called stating that patient will not be able to attend upcoming appointment due to [place] at the facility and stated that Right Hip incision is infected and patient will start antibiotics today. Right Hip surgery on Monday, 02/12/2019.  Would like to know what Dr. Lorin Mercy suggests?  Cb# [phone].  Please advise.  Thank you.

## 2019-02-19 NOTE — Progress Notes (Signed)
Location:    [place] Room Number: 150/P Place of Service:  SNF (31)   CODE STATUS: DNR  Allergies  Allergen Reactions  . Levofloxacin     unknown    Chief Complaint  Patient presents with  . Acute Visit    Fever    HPI:  She has a fever of 101.6. she has recent had right hip surgery due to fracture. There are reports of cough without sputum. Her appetite is poor she is drinking fluids. Her incision line is red and inflamed . She will need a workup of her fever. She has tested negative for covid in the past and has tested positive for mrsa in the past.   Past Medical History:  Diagnosis Date  . Alcohol abuse 02/07/2018  . Atrial fibrillation ([place])   . Atrial fibrillation, permanent ([place]) 02/13/2019  . CHF (congestive heart failure) ([place]) 02/07/2018  . Dementia ([place]) 02/07/2018  . Dysphagia   . GERD (gastroesophageal reflux disease)   . High cholesterol   . Hypertension   . Hypokalemia   . Lung cancer ([place])   . Psychosis ([place]) 04/08/2018  . Seizures ([place]) 02/07/2018  . Thyroid disease   . TIA (transient ischemic attack)     Past Surgical History:  Procedure Laterality Date  . cardiac implants and grafts    . HIP ARTHROPLASTY Right 02/12/2019   Procedure: ARTHROPLASTY BIPOLAR HIP (HEMIARTHROPLASTY);  Surgeon: Marybelle Killings, MD;  Location: [facility];  Service: Orthopedics;  Laterality: Right;  . LOBECTOMY    . REPLACEMENT TOTAL KNEE Bilateral   . TOTAL HIP ARTHROPLASTY Left 10/05/2018   Procedure: HEMI HIP ARTHROPLASTY ANTERIOR APPROACH;  Surgeon: Leandrew Koyanagi, MD;  Location: [facility];  Service: Orthopedics;  Laterality: Left;    Social History   Socioeconomic History  . Marital status: Widowed    Spouse name: Not on file  . Number of children: Not on file  . Years of education: Not on file  . Highest education level: Not on file  Occupational History  . Occupation: retired   Tobacco Use  . Smoking status: Never Smoker  . Smokeless  tobacco: Never Used  Substance and Sexual Activity  . Alcohol use: Not Currently  . Drug use: No  . Sexual activity: Not Currently  Other Topics Concern  . Not on file  Social History Narrative   Long term resident of [facility]   Unable to participate in questions    Social Determinants of Health   Financial Resource Strain:   . Difficulty of Paying Living Expenses: Not on file  Food Insecurity:   . Worried About Charity fundraiser in the Last Year: Not on file  . Ran Out of Food in the Last Year: Not on file  Transportation Needs:   . Lack of Transportation (Medical): Not on file  . Lack of Transportation (Non-Medical): Not on file  Physical Activity:   . Days of Exercise per Week: Not on file  . Minutes of Exercise per Session: Not on file  Stress:   . Feeling of Stress : Not on file  Social Connections:   . Frequency of Communication with Friends and Family: Not on file  . Frequency of Social Gatherings with Friends and Family: Not on file  . Attends Religious Services: Not on file  . Active Member of Clubs or Organizations: Not on file  . Attends Archivist Meetings: Not on file  . Marital Status: Not on  file  Intimate Partner Violence:   . Fear of Current or Ex-Partner: Not on file  . Emotionally Abused: Not on file  . Physically Abused: Not on file  . Sexually Abused: Not on file   Family History  Problem Relation Age of Onset  . Heart attack Father        65s      VITAL SIGNS BP (!) 129/98   Pulse (!) 32   Temp (!) 101.6 F (38.7 C) (Oral)   Resp 20   Ht 5\' 2"  (1.575 m)   Wt 90 lb 8 oz (41.1 kg)   BMI 16.55 kg/m   Outpatient Encounter Medications as of 02/19/2019  Medication Sig  . ALPRAZolam (XANAX) 0.25 MG tablet Take 1 tablet (0.25 mg total) by mouth at bedtime.  . Amino Acids-Protein Hydrolys (FEEDING SUPPLEMENT, PRO-STAT SUGAR FREE 64,) LIQD Take 30 mLs by mouth 2 (two) times daily between meals.  Marland Kitchen aspirin 325 MG tablet Take 1 tablet  (325 mg total) by mouth daily.  Roseanne Kaufman Peru-Castor Oil (VENELEX) OINT Apply 1 application topically See admin instructions. Apply to sacrum, coccyx, and bilateral buttocks every shift and as needed for wound  . bisacodyl (DULCOLAX) 10 MG suppository Place 1 suppository (10 mg total) rectally daily as needed for moderate constipation.  . busPIRone (BUSPAR) 5 MG tablet Take 5 mg by mouth 2 (two) times daily.  . Cholecalciferol 1.25 MG (50000 UT) capsule Take 50,000 Units by mouth. Once a day on Thursday  . diltiazem (CARDIZEM) 90 MG tablet Take 1 tablet (90 mg total) by mouth every 8 (eight) hours.  . docusate (COLACE) 50 MG/5ML liquid Take 10 mLs (100 mg total) by mouth 2 (two) times daily.  . Eyelid Cleansers (OCUSOFT EYELID CLEANSING) PADS Apply 1 application topically 2 (two) times daily. cleanse both eyes BID Twice A Day 09:00 AM, 09:00 PM   . levothyroxine (SYNTHROID, LEVOTHROID) 50 MCG tablet Take 50 mcg by mouth daily before breakfast.  . NON FORMULARY Diet Change: Dysphagia 1 (puree), thin liquids  . NON FORMULARY Magic cup daily- for increased nutrition intake Once A Day 02:00 PM  . polyethylene glycol (MIRALAX / GLYCOLAX) 17 g packet Take 17 g by mouth daily as needed for mild constipation.  . QUEtiapine Fumarate (SEROQUEL PO) Take 12.5 mg by mouth 2 (two) times daily.   Marland Kitchen senna (SENOKOT) 8.6 MG TABS tablet Take 2 tablets (17.2 mg total) by mouth daily.  . traMADol (ULTRAM) 50 MG tablet Take 1 tablet (50 mg total) by mouth every 8 (eight) hours.  Marland Kitchen zinc sulfate 220 (50 Zn) MG capsule Take 220 mg by mouth daily.   No facility-administered encounter medications on file as of 02/19/2019.     SIGNIFICANT DIAGNOSTIC EXAMS  PREVIOUS    10-03-18: left hip and pelvis x-ray: Mildly comminuted, foreshortened and slightly valgus angulated transcervical left femoral neck fracture. Remote appearing posttraumatic deformity of the left inferior and superior pubic ramus  10-03-18: chest  x-ray:  1. No active disease.  Likely chronic coarse interstitial changes. 2. Question projectional irregularity versus fracture of the left humerus. Correlate with point tenderness and consider dedicated shoulder radiographs if there is clinical concern  10-03-18: left humerus x-ray: Age indeterminate fracture of the left humeral neck. Clinical correlation is recommended.  10-05-18: pelvic x-ray:  1. Interval left hip replacement with expected surgical change 2. Chronic deformities of the left superior and inferior pubic rami  02-11-19: chest x-ray: No evidence of acute cardiopulmonary disease  02-11-19: right hip x-ray: Displaced right femoral neck fracture.   NO NEW EXAMS.     LABS REVIEWED PREVIOUS;   03-17-18: wbc 5.7; hgb 12.4; hct 38.8; mcv 108.1; plt 224; glucose 85; bun 13; creat 0.91; k+ 3.3; na++ 138; ca 8.7 protein 5.7 albumin 3.1 hgb 4.8 03-20-18: glucose 74; bun 14; creat 0.81; k+ 3.5; na++ 137; ca 9.2 04-19-28: tsh 93.852 05-18-18: tsh 31.786 06-29-18: vit B 12: 308; tsh 14.789 07-11-18: wbc 4.7; hgb 12.9; hct 39.1; mcv 104.5; plt 146 ; glucose 89; bun 24; creat 0.74; k+ 3.9; na++ 141; ca 9.0 liver normal albumin 3.2 tsh 13.945 free T4: 0.78 08-01-18: tsh 6.297 free T3; 2.2 free T4: 1.13 10-03-18: wbc 8.4; hgb 15.6; hct 48.0; mcv 99.2 plt 160; glucose 124; bun 30; creat 0.81; k+ 4.3; na++ 142; ca 9.6 10-06-18: wbc 9.9; hgb 15.3; hct 46.1; mcv 99.1; plt 121; glucose 134; bun 15; creat 0.66; k+ 3.8; na++ 140; ca 8.8 10-09-18: wbc 7.8; hgb 13.2; hct 39.0; mcv 98.7 plt 135 01-26-19: wbc 5.8; hgb 13.6; hct  42.3; mcv 104.7 plt 145; iron 129 tibc 377; vit B 12: 392; folate 6.6; vit B 1: 90.0 02-11-19: wbc 9.2; hgb 14.7; hct 43.7;mcv 102.1 plt 144; glucose 122; bun 22; creat 0.64; k+ 4.3; na++ 139; ca 9.3 02-14-19: wbc 9.6; hgb 12.6; hct 36.9; mcv 102.2 plt 145; glucose 121; bun 21; creat 0.89; k+ 4.0; na++ 144;ca 8.9   NO NEW LABS.    Review of Systems  Unable to perform ROS:  Dementia (unable to participate )    Physical Exam Constitutional:      General: She is not in acute distress.    Appearance: She is cachectic. She is ill-appearing. She is not diaphoretic.  Neck:     Thyroid: No thyromegaly.  Cardiovascular:     Rate and Rhythm: Normal rate and regular rhythm.     Pulses: Normal pulses.     Heart sounds: Normal heart sounds.     Comments: History of cardiac stent Pulmonary:     Effort: Pulmonary effort is normal. No respiratory distress.     Breath sounds: Normal breath sounds.     Comments: Rhonchi In bases  Abdominal:     General: Bowel sounds are normal. There is no distension.     Palpations: Abdomen is soft.     Tenderness: There is no abdominal tenderness.  Genitourinary:    Comments: Foley  Musculoskeletal:     Cervical back: Neck supple.     Right lower leg: No edema.     Left lower leg: No edema.     Comments: Is able to move all extremities  Is status post right hip hemi-arthroplasty 02-12-19 Is status post left hip hemi arthroplasty 10-05-18 Is status post left humeral fracture History of bilateral knee replacements     Lymphadenopathy:     Cervical: No cervical adenopathy.  Skin:    General: Skin is warm and dry.     Comments: Right hip incision line red hot and painful to touch without drainage present   Neurological:     Mental Status: She is alert. Mental status is at baseline.  Psychiatric:        Mood and Affect: Mood normal.     ASSESSMENT/ PLAN:  TODAY  1. Right hip incision line cellulitis 2. HCAP  Will get cbc; blood culture X 2 sites Will get chest x-ray  Will start rocephin 1 gm IM through 02-26-19  Will monitor  her status.   MD is aware of resident's narcotic use and is in agreement with current plan of care. We will attempt to wean resident as appropriate.  Ok Edwards NP [facility]  Contact [phone] Monday through Friday 8am- 5pm  After hours call [phone]

## 2019-02-20 ENCOUNTER — Non-Acute Institutional Stay (SKILLED_NURSING_FACILITY): Payer: Medicare HMO | Admitting: Adult Health

## 2019-02-20 ENCOUNTER — Encounter: Payer: Self-pay | Admitting: Adult Health

## 2019-02-20 DIAGNOSIS — J189 Pneumonia, unspecified organism: Secondary | ICD-10-CM | POA: Diagnosis not present

## 2019-02-20 DIAGNOSIS — L03115 Cellulitis of right lower limb: Secondary | ICD-10-CM

## 2019-02-20 NOTE — Telephone Encounter (Signed)
I called originally talked with nurse x2 talked with nurse practitioner who takes care of patient.  Patient had past history of positive PCR also was positive for Covid when she came into the hospital.  She currently has pneumonia they gave her IM Rocephin however with her history of positive PCR she needs to be covered for MRSA and I would recommend some doxycycline.  Patient is allergic to Levaquin.  I discussed with the nurse practitioner will take care of the appropriate medication.  They will FaceTime me in 1 week so I can see what her incision looks like and then have the nurse there remove her staples if the wound is healed well enough.  Trying to push nutrition.  I tried to call and speak with patient's daughter but no answer x2.

## 2019-02-20 NOTE — Progress Notes (Signed)
Location:    [facility] Room Number: 150/P Place of Service:  SNF (31)   CODE STATUS: DNR  Allergies  Allergen Reactions  . Levofloxacin     unknown    Chief Complaint  Patient presents with  . Acute Visit    Pneumonia    HPI:  She is being treated for pneumonia. Her incision line remains red and hot to touch with some swelling present. There is no drainage present. I have spoke with  Dr. Lorin Mercy and he would like for her to be on doxycycline for her incision line due to her history of of mrsa. There are no reports of ocugh or shortness of breath present.   Past Medical History:  Diagnosis Date  . Alcohol abuse 02/07/2018  . Atrial fibrillation ([place])   . Atrial fibrillation, permanent ([place]) 02/13/2019  . CHF (congestive heart failure) ([place]) 02/07/2018  . Dementia ([place]) 02/07/2018  . Dysphagia   . GERD (gastroesophageal reflux disease)   . High cholesterol   . Hypertension   . Hypokalemia   . Lung cancer ([place])   . Psychosis ([place]) 04/08/2018  . Seizures ([place]) 02/07/2018  . Thyroid disease   . TIA (transient ischemic attack)     Past Surgical History:  Procedure Laterality Date  . cardiac implants and grafts    . HIP ARTHROPLASTY Right 02/12/2019   Procedure: ARTHROPLASTY BIPOLAR HIP (HEMIARTHROPLASTY);  Surgeon: Marybelle Killings, MD;  Location: [facility];  Service: Orthopedics;  Laterality: Right;  . LOBECTOMY    . REPLACEMENT TOTAL KNEE Bilateral   . TOTAL HIP ARTHROPLASTY Left 10/05/2018   Procedure: HEMI HIP ARTHROPLASTY ANTERIOR APPROACH;  Surgeon: Leandrew Koyanagi, MD;  Location: [facility];  Service: Orthopedics;  Laterality: Left;    Social History   Socioeconomic History  . Marital status: Widowed    Spouse name: Not on file  . Number of children: Not on file  . Years of education: Not on file  . Highest education level: Not on file  Occupational History  . Occupation: retired   Tobacco Use  . Smoking status: Never Smoker  . Smokeless  tobacco: Never Used  Substance and Sexual Activity  . Alcohol use: Not Currently  . Drug use: No  . Sexual activity: Not Currently  Other Topics Concern  . Not on file  Social History Narrative   Long term resident of [facility]   Unable to participate in questions    Social Determinants of Health   Financial Resource Strain:   . Difficulty of Paying Living Expenses: Not on file  Food Insecurity:   . Worried About Charity fundraiser in the Last Year: Not on file  . Ran Out of Food in the Last Year: Not on file  Transportation Needs:   . Lack of Transportation (Medical): Not on file  . Lack of Transportation (Non-Medical): Not on file  Physical Activity:   . Days of Exercise per Week: Not on file  . Minutes of Exercise per Session: Not on file  Stress:   . Feeling of Stress : Not on file  Social Connections:   . Frequency of Communication with Friends and Family: Not on file  . Frequency of Social Gatherings with Friends and Family: Not on file  . Attends Religious Services: Not on file  . Active Member of Clubs or Organizations: Not on file  . Attends Archivist Meetings: Not on file  . Marital Status: Not on file  Intimate Partner Violence:   . Fear of Current or Ex-Partner: Not on file  . Emotionally Abused: Not on file  . Physically Abused: Not on file  . Sexually Abused: Not on file   Family History  Problem Relation Age of Onset  . Heart attack Father        13s      VITAL SIGNS BP 112/79   Pulse 91   Temp 99.9 F (37.7 C) (Oral)   Resp 20   Ht 5\' 2"  (1.575 m)   Wt 90 lb 8 oz (41.1 kg)   BMI 16.55 kg/m   Outpatient Encounter Medications as of 02/20/2019  Medication Sig  . acetaminophen (TYLENOL) 325 MG tablet Take 650 mg by mouth 3 (three) times daily as needed.  . ALPRAZolam (XANAX) 0.25 MG tablet Take 1 tablet (0.25 mg total) by mouth at bedtime.  . Amino Acids-Protein Hydrolys (FEEDING SUPPLEMENT, PRO-STAT SUGAR FREE 64,) LIQD Take 30 mLs by  mouth 2 (two) times daily between meals.  Marland Kitchen aspirin 325 MG tablet Take 1 tablet (325 mg total) by mouth daily.  Roseanne Kaufman Peru-Castor Oil (VENELEX) OINT Apply 1 application topically See admin instructions. Apply to sacrum, coccyx, and bilateral buttocks every shift and as needed for wound  . bisacodyl (DULCOLAX) 10 MG suppository Place 1 suppository (10 mg total) rectally daily as needed for moderate constipation.  . busPIRone (BUSPAR) 5 MG tablet Take 5 mg by mouth 2 (two) times daily.  . cefTRIAXone (ROCEPHIN) 1 g injection Inject 1 g into the muscle daily.  . Cholecalciferol 1.25 MG (50000 UT) capsule Take 50,000 Units by mouth. Once a day on Thursday  . diltiazem (CARDIZEM) 90 MG tablet Take 1 tablet (90 mg total) by mouth every 8 (eight) hours.  . docusate (COLACE) 50 MG/5ML liquid Take 10 mLs (100 mg total) by mouth 2 (two) times daily.  Marland Kitchen doxycycline (VIBRAMYCIN) 100 MG capsule Take 100 mg by mouth 2 (two) times daily.  . Eyelid Cleansers (OCUSOFT EYELID CLEANSING) PADS Apply 1 application topically 2 (two) times daily. cleanse both eyes BID Twice A Day 09:00 AM, 09:00 PM   . levothyroxine (SYNTHROID, LEVOTHROID) 50 MCG tablet Take 50 mcg by mouth daily before breakfast.  . NON FORMULARY Diet Change: Dysphagia 1 (puree), thin liquids  . NON FORMULARY Magic cup daily- for increased nutrition intake Once A Day 02:00 PM  . polyethylene glycol (MIRALAX / GLYCOLAX) 17 g packet Take 17 g by mouth daily as needed for mild constipation.  . QUEtiapine Fumarate (SEROQUEL PO) Take 12.5 mg by mouth 2 (two) times daily.   Marland Kitchen senna (SENOKOT) 8.6 MG TABS tablet Take 2 tablets (17.2 mg total) by mouth daily.  . traMADol (ULTRAM) 50 MG tablet Take 2 tablets (100 mg total) by mouth every 8 (eight) hours.  Marland Kitchen zinc sulfate 220 (50 Zn) MG capsule Take 220 mg by mouth daily.   No facility-administered encounter medications on file as of 02/20/2019.     SIGNIFICANT DIAGNOSTIC EXAMS   PREVIOUS     10-03-18: left hip and pelvis x-ray: Mildly comminuted, foreshortened and slightly valgus angulated transcervical left femoral neck fracture. Remote appearing posttraumatic deformity of the left inferior and superior pubic ramus  10-03-18: chest x-ray:  1. No active disease.  Likely chronic coarse interstitial changes. 2. Question projectional irregularity versus fracture of the left humerus. Correlate with point tenderness and consider dedicated shoulder radiographs if there is clinical concern  10-03-18: left humerus x-ray: Age indeterminate fracture  of the left humeral neck. Clinical correlation is recommended.  10-05-18: pelvic x-ray:  1. Interval left hip replacement with expected surgical change 2. Chronic deformities of the left superior and inferior pubic rami  02-11-19: chest x-ray: No evidence of acute cardiopulmonary disease   02-11-19: right hip x-ray: Displaced right femoral neck fracture.    TODAY   02-19-28: chest x-ray:Changes of bibasilar pneumonia. Consider possible development of perihilar bronchopneumonia.     LABS REVIEWED PREVIOUS;   03-17-18: wbc 5.7; hgb 12.4; hct 38.8; mcv 108.1; plt 224; glucose 85; bun 13; creat 0.91; k+ 3.3; na++ 138; ca 8.7 protein 5.7 albumin 3.1 hgb 4.8 03-20-18: glucose 74; bun 14; creat 0.81; k+ 3.5; na++ 137; ca 9.2 04-19-28: tsh 93.852 05-18-18: tsh 31.786 06-29-18: vit B 12: 308; tsh 14.789 07-11-18: wbc 4.7; hgb 12.9; hct 39.1; mcv 104.5; plt 146 ; glucose 89; bun 24; creat 0.74; k+ 3.9; na++ 141; ca 9.0 liver normal albumin 3.2 tsh 13.945 free T4: 0.78 08-01-18: tsh 6.297 free T3; 2.2 free T4: 1.13 10-03-18: wbc 8.4; hgb 15.6; hct 48.0; mcv 99.2 plt 160; glucose 124; bun 30; creat 0.81; k+ 4.3; na++ 142; ca 9.6 10-06-18: wbc 9.9; hgb 15.3; hct 46.1; mcv 99.1; plt 121; glucose 134; bun 15; creat 0.66; k+ 3.8; na++ 140; ca 8.8 10-09-18: wbc 7.8; hgb 13.2; hct 39.0; mcv 98.7 plt 135 01-26-19: wbc 5.8; hgb 13.6; hct  42.3; mcv 104.7 plt 145;  iron 129 tibc 377; vit B 12: 392; folate 6.6; vit B 1: 90.0 02-11-19: wbc 9.2; hgb 14.7; hct 43.7;mcv 102.1 plt 144; glucose 122; bun 22; creat 0.64; k+ 4.3; na++ 139; ca 9.3 02-14-19: wbc 9.6; hgb 12.6; hct 36.9; mcv 102.2 plt 145; glucose 121; bun 21; creat 0.89; k+ 4.0; na++ 144;ca 8.9   TODAY  02-19-19: wbc 9.8; hgb 13.5; hct 40.6; mcv 103.0 plt 227; glucose 97; bun 16; creat 0.76; k+ 3.3; na++ 145; ca 9.5    Review of Systems  Unable to perform ROS: Dementia (unable to participate )    Physical Exam Constitutional:      General: She is not in acute distress.    Appearance: She is cachectic. She is ill-appearing. She is not diaphoretic.  Neck:     Thyroid: No thyromegaly.  Cardiovascular:     Rate and Rhythm: Normal rate and regular rhythm.     Pulses: Normal pulses.     Heart sounds: Normal heart sounds.     Comments: History of cardiac stent Pulmonary:     Effort: Pulmonary effort is normal. No respiratory distress.     Breath sounds: Rhonchi present.  Abdominal:     General: Bowel sounds are normal. There is no distension.     Palpations: Abdomen is soft.     Tenderness: There is no abdominal tenderness.  Genitourinary:    Comments: Foley  Musculoskeletal:     Cervical back: Neck supple.     Right lower leg: No edema.     Left lower leg: No edema.     Comments: Is able to move all extremities  Is status post right hip hemi-arthroplasty 02-12-19 Is status post left hip hemi arthroplasty 10-05-18 Is status post left humeral fracture History of bilateral knee replacements      Lymphadenopathy:     Cervical: No cervical adenopathy.  Skin:    General: Skin is warm and dry.     Comments: Right hip incision line red hot and painful to touch without drainage present  Neurological:     Mental Status: She is alert. Mental status is at baseline.  Psychiatric:        Mood and Affect: Mood normal.      ASSESSMENT/ PLAN:  TODAY  1. HCAP 2. Right hip  cellulitis  After speaking with DR Lorin Mercy will begin doxycycline 100 mg twice daily through 02-27-19  MD is aware of resident's narcotic use and is in agreement with current plan of care. We will attempt to wean resident as appropriate.  Ok Edwards NP [facility]  Contact [phone] Monday through Friday 8am- 5pm  After hours call [phone]

## 2019-02-21 ENCOUNTER — Encounter: Payer: Self-pay | Admitting: Internal Medicine

## 2019-02-21 ENCOUNTER — Other Ambulatory Visit: Payer: Self-pay | Admitting: Internal Medicine

## 2019-02-21 ENCOUNTER — Non-Acute Institutional Stay (SKILLED_NURSING_FACILITY): Payer: Medicare HMO | Admitting: Internal Medicine

## 2019-02-21 ENCOUNTER — Other Ambulatory Visit (HOSPITAL_COMMUNITY)
Admission: RE | Admit: 2019-02-21 | Discharge: 2019-02-21 | Disposition: A | Discharge status: Home / Self Care | Payer: Medicare HMO | Source: Skilled Nursing Facility | Attending: Adult Health | Admitting: Adult Health

## 2019-02-21 DIAGNOSIS — S72011D Unspecified intracapsular fracture of right femur, subsequent encounter for closed fracture with routine healing: Secondary | ICD-10-CM | POA: Diagnosis not present

## 2019-02-21 DIAGNOSIS — I4891 Unspecified atrial fibrillation: Secondary | ICD-10-CM

## 2019-02-21 DIAGNOSIS — E039 Hypothyroidism, unspecified: Secondary | ICD-10-CM

## 2019-02-21 DIAGNOSIS — F03918 Unspecified dementia, unspecified severity, with other behavioral disturbance: Secondary | ICD-10-CM

## 2019-02-21 DIAGNOSIS — I10 Essential (primary) hypertension: Secondary | ICD-10-CM | POA: Insufficient documentation

## 2019-02-21 DIAGNOSIS — F0390 Unspecified dementia without behavioral disturbance: Secondary | ICD-10-CM

## 2019-02-21 DIAGNOSIS — F0391 Unspecified dementia with behavioral disturbance: Secondary | ICD-10-CM

## 2019-02-21 DIAGNOSIS — K5909 Other constipation: Secondary | ICD-10-CM

## 2019-02-21 DIAGNOSIS — F03C0 Unspecified dementia, severe, without behavioral disturbance, psychotic disturbance, mood disturbance, and anxiety: Secondary | ICD-10-CM

## 2019-02-21 LAB — BASIC METABOLIC PANEL
Anion gap: 9 (ref 5–15)
BUN: 18 mg/dL (ref 8–23)
CO2: 24 mmol/L (ref 22–32)
Calcium: 8.8 mg/dL — ABNORMAL LOW (ref 8.9–10.3)
Chloride: 108 mmol/L (ref 98–111)
Creatinine, Ser: 0.54 mg/dL (ref 0.44–1.00)
GFR calc Af Amer: >60 mL/min (ref >60)
GFR calc non Af Amer: >60 mL/min (ref >60)
Glucose, Bld: 85 mg/dL (ref 70–99)
Potassium: 4.5 mmol/L (ref 3.5–5.1)
Sodium: 141 mmol/L (ref 135–145)

## 2019-02-21 MED ORDER — TRAMADOL HCL 50 MG PO TABS: 100.0000 mg | ORAL_TABLET | Freq: Every day | ORAL | 0 refills | Status: DC

## 2019-02-21 NOTE — Telephone Encounter (Signed)
noted 

## 2019-02-21 NOTE — Progress Notes (Signed)
: Provider:  Hennie Duos., MD Location:  [facility] Room Number: 150-P Place of Service:  SNF ([phone])  PCP: Hennie Duos, MD Patient Care Team: Hennie Duos, MD as PCP - General (Internal Medicine) Martinique, Peter M, MD as PCP - Cardiology (Cardiology) Nyoka Cowden Phylis Bougie, NP as Nurse Practitioner ([place]) Center, [facility] [place] ([place])  Extended Emergency Contact Information Primary Emergency Contact: KELLY,WENDY Address: [address], [place] [address] [place] Phone: [phone] Relation: Daughter Secondary Emergency Contact: Quentin Ore Address: [address] [address] [place] [employer] [place] Phone: [phone] Relation: Daughter     Allergies: Levofloxacin  Chief Complaint  Patient presents with  . Readmit To SNF    Readmit to [facility]    HPI: Patient is a 85 y.o. female with dementia with psychosis, congestive heart failure, seizures, hypothyroidism, and hypertension, with a recent left femoral neck fracture with arthroplasty on 10/05/2018 who presented to [facility] ED after a fall 3 days prior.  At that time her foot was wedged underneath her bed.  She was noted to have a hip x-ray on 12/19 demonstrating displaced subcapital right femoral fracture and was sent to the ED today for further evaluation.  Patient is admitted to [facility] from 12/20-23 where she was noted to be in atrial fib with RVR and started on a Cardizem drip which was transitioned to oral Cardizem with good control.  Patient underwent a right hemiarthroplasty on 12/21 for pain control.Not because family desires to be aggressive.  Patient is admitted back to skilled nursing facility for OT/PT and residential care.  While at skilled nursing facility patient will be followed for dementia with behaviors treated with Seroquel BuSpar and Xanax,  hypothyroidism treated with Synthroid and constipation treated with Colace and senna.  Past Medical History:  Diagnosis Date  . Alcohol abuse 02/07/2018  . Atrial fibrillation ([place])   . Atrial fibrillation, permanent ([place]) 02/13/2019  . CHF (congestive heart failure) ([place]) 02/07/2018  . Dementia ([place]) 02/07/2018  . Dysphagia   . GERD (gastroesophageal reflux disease)   . High cholesterol   . Hypertension   . Hypokalemia   . Lung cancer ([place])   . Psychosis ([place]) 04/08/2018  . Seizures ([place]) 02/07/2018  . Thyroid disease   . TIA (transient ischemic attack)     Past Surgical History:  Procedure Laterality Date  . cardiac implants and grafts    . HIP ARTHROPLASTY Right 02/12/2019   Procedure: ARTHROPLASTY BIPOLAR HIP (HEMIARTHROPLASTY);  Surgeon: Marybelle Killings, MD;  Location: [facility];  Service: Orthopedics;  Laterality: Right;  . LOBECTOMY    . REPLACEMENT TOTAL KNEE Bilateral   . TOTAL HIP ARTHROPLASTY Left 10/05/2018   Procedure: HEMI HIP ARTHROPLASTY ANTERIOR APPROACH;  Surgeon: Leandrew Koyanagi, MD;  Location: [facility];  Service: Orthopedics;  Laterality: Left;    Allergies as of 02/21/2019      Reactions   Levofloxacin    unknown      Medication List    Notice   This visit is during an admission. Changes to the med list made in this visit will be reflected in the After Visit Summary of the admission.    Current Outpatient Medications on File Prior to Visit  Medication Sig Dispense Refill  . acetaminophen (TYLENOL) 325 MG tablet Take 650 mg by mouth 3 (three) times daily as needed.    . Amino Acids-Protein Hydrolys (FEEDING SUPPLEMENT, PRO-STAT SUGAR FREE 64,) LIQD Take 30 mLs by mouth 2 (two) times daily between meals.    Marland Kitchen aspirin 325 MG tablet Take 1 tablet (325 mg total) by mouth daily.    Roseanne Kaufman Peru-Castor Oil (VENELEX) OINT Apply 1 application topically See admin instructions. Apply to sacrum, coccyx, and bilateral buttocks every shift and as needed for wound    .  bisacodyl (DULCOLAX) 10 MG suppository Place 1 suppository (10 mg total) rectally daily as needed for moderate constipation. 12 suppository 0  . busPIRone (BUSPAR) 5 MG tablet Take 5 mg by mouth 3 (three) times daily.     . cefTRIAXone (ROCEPHIN) 1 g injection Inject 1 g into the muscle daily.    . Cholecalciferol 1.25 MG (50000 UT) capsule Take 50,000 Units by mouth. Once a day on Thursday    . diltiazem (CARDIZEM) 90 MG tablet Take 1 tablet (90 mg total) by mouth every 8 (eight) hours.    . docusate (COLACE) 50 MG/5ML liquid Take 10 mLs (100 mg total) by mouth 2 (two) times daily. 100 mL 0  . doxycycline (VIBRAMYCIN) 100 MG capsule Take 100 mg by mouth 2 (two) times daily.    . Eyelid Cleansers (OCUSOFT EYELID CLEANSING) PADS Apply 1 application topically 2 (two) times daily. cleanse both eyes BID Twice A Day 09:00 AM, 09:00 PM     . levothyroxine (SYNTHROID, LEVOTHROID) 50 MCG tablet Take 50 mcg by mouth daily before breakfast.    . NON FORMULARY Diet Change: Dysphagia 1 (puree), thin liquids    . NON FORMULARY Magic cup daily- for increased nutrition intake Once A Day 02:00 PM    . polyethylene glycol (MIRALAX / GLYCOLAX) 17 g packet Take 17 g by mouth daily as needed for mild constipation. 14 each 0  . QUEtiapine Fumarate (SEROQUEL PO) Take 12.5 mg by mouth 2 (two) times daily.     Marland Kitchen senna (SENOKOT) 8.6 MG TABS tablet Take 2 tablets (17.2 mg total) by mouth daily. 120 tablet 0  . traMADol (ULTRAM) 50 MG tablet Take 2 tablets (100 mg total) by mouth every 8 (eight) hours. 180 tablet 0  . zinc sulfate 220 (50 Zn) MG capsule Take 220 mg by mouth daily.     No current facility-administered medications on file prior to visit.     No orders of the defined types were placed in this encounter.   Immunization History  Administered Date(s) Administered  . Influenza-Unspecified 11/27/2018  . Pneumococcal Polysaccharide-23 08/10/2018  . Pneumococcal-Unspecified 11/14/2018  . Td 11/14/2018      Social History   Tobacco Use  . Smoking status: Never Smoker  . Smokeless tobacco: Never Used  Substance Use Topics  . Alcohol use: Not Currently    Family history is   Family History  Problem Relation Age of Onset  . Heart attack Father        72s      Review of Systems   unable to obtain secondary to dementia; nursing-no acute concerns     Vitals:   02/21/19 0939  BP: 112/79  Pulse: 91  Resp: 20  Temp: 99.9 F (37.7 C)    SpO2 Readings from Last 1 Encounters:  02/14/19 96%   Body mass index is 16.55 kg/m.     Physical Exam  GENERAL  APPEARANCE: Alert,   No acute distress.  SKIN: Incision area without excessive heat or redness or swelling HEAD: Normocephalic, atraumatic  EYES: Conjunctiva/lids clear. Pupils round, reactive. EOMs intact.  EARS: External exam WNL, canals clear. Hearing grossly normal.  NOSE: No deformity or discharge.  MOUTH/THROAT: Lips w/o lesions  RESPIRATORY: Breathing is even, unlabored. Lung sounds are clear   CARDIOVASCULAR: Heart RRR no murmurs, rubs or gallops. No peripheral edema.   GASTROINTESTINAL: Abdomen is soft, non-tender, not distended w/ normal bowel sounds. GENITOURINARY: Bladder non tender, not distended  MUSCULOSKELETAL: No abnormal joints or musculature NEUROLOGIC:  Cranial nerves 2-12 grossly intact. Moves all extremities  PSYCHIATRIC: Dementia  Patient Active Problem List   Diagnosis Date Noted  . Hip fracture ([place]) 02/11/2019  . Medicare annual wellness visit, subsequent 01/23/2019  . Closed left humeral fracture 10/14/2018  . Goals of care, counseling/discussion   . Palliative care by specialist   . Left displaced femoral neck fracture ([place]) 10/03/2018  . Left ankle sprain 06/02/2018  . Chronic anxiety 05/11/2018  . Acquired hypothyroidism 04/25/2018  . Psychosis in elderly with behavioral disturbance ([place]) 04/08/2018  . Atrial fibrillation with RVR ([place]) 02/07/2018  . Advanced dementia ([place])  02/07/2018  . Seizures ([place]) 02/07/2018  . Alcohol abuse 02/07/2018  . CHF (congestive heart failure) ([place]) 02/07/2018      Labs reviewed: Basic Metabolic Panel:    Component Value Date/Time   NA 141 02/21/2019 0615   K 4.5 02/21/2019 0615   CL 108 02/21/2019 0615   CO2 24 02/21/2019 0615   GLUCOSE 85 02/21/2019 0615   BUN 18 02/21/2019 0615   CREATININE 0.54 02/21/2019 0615   CALCIUM 8.8 (L) 02/21/2019 0615   PROT 5.6 (L) 07/11/2018 0600   ALBUMIN 3.2 (L) 07/11/2018 0600   AST 18 07/11/2018 0600   ALT 13 07/11/2018 0600   ALKPHOS 90 07/11/2018 0600   BILITOT 0.7 07/11/2018 0600   GFRNONAA >60 02/21/2019 0615   GFRAA >60 02/21/2019 0615    Recent Labs    02/12/19 0332 02/14/19 0956 02/19/19 0836 02/21/19 0615  NA 142 144 145 141  K 3.9 4.0 3.3* 4.5  CL 106 110 107 108  CO2 25 23 23 24   GLUCOSE 105* 121* 97 85  BUN 18 21 16 18   CREATININE 0.77 0.89 0.76 0.54  CALCIUM 9.2 8.9 9.5 8.8*  MG 2.2  --   --   --    Liver Function Tests: Recent Labs    03/17/18 0759 07/11/18 0600  AST 20 18  ALT 14 13  ALKPHOS 81 90  BILITOT 0.9 0.7  PROT 5.7* 5.6*  ALBUMIN 3.1* 3.2*   No results for input(s): LIPASE, AMYLASE in the last 8760 hours. No results for input(s): AMMONIA in the last 8760 hours. CBC: Recent Labs    10/09/18 0613 02/11/19 0845 02/13/19 0408 02/14/19 0956 02/19/19 0836  WBC 7.8 9.2 11.1* 9.6 9.8  NEUTROABS 5.7 6.6  --   --  7.4  HGB 13.2 14.7 13.3 12.6 13.5  HCT 39.0 43.7 39.2 36.9 40.6  MCV 98.7 102.1* 100.8* 102.2* 103.0*  PLT 135* 144* 128* 145* 227   Lipid No results for input(s): CHOL, HDL, LDLCALC, TRIG in the last 8760 hours.  Cardiac Enzymes: No results for input(s): CKTOTAL, CKMB, CKMBINDEX, TROPONINI in the last 8760 hours. BNP: No results for input(s): BNP in the last 8760 hours. No results found for: [facility] Lab Results  Component Value Date   HGBA1C 4.8 03/17/2018  Lab Results  Component Value Date   TSH 6.297  (H) 08/01/2018   Lab Results  Component Value Date   VITAMINB12 392 01/26/2019   Lab Results  Component Value Date   FOLATE 6.6 01/26/2019   Lab Results  Component Value Date   IRON 129 01/26/2019   TIBC 377 01/26/2019    Imaging and Procedures obtained prior to SNF admission: No results found.   Not all labs, radiology exams or other studies done during hospitalization come through on my EPIC note; however they are reviewed by me.    Assessment and Plan  Displaced subcapital right femoral fracture-status post arthroplasty for pain control; of note patient had a recent left neck fracture with arthroplasty on 10/05/2018 SNF-admitted for OT/PT and residential care; continue ASA 3.5 mg daily for 30 days presumed as it was not stated for prophylaxis  Atrial fibrillation with RVR-treated with a diltiazem drip then transition to oral Cardizem with good control SNF-continue diltiazem 90 mg every 8 hours for rate control  Hypothyroidism SNF not stated as uncontrolled; continue Synthroid 50 mcg daily  Severe dementia with behaviors/psychosis SNF-appears controlled on Xanax 0.25 mg nightly, BuSpar 5 mg twice daily, and Seroquel 12.5 mg twice daily  Constipation SNF-appears controlled; continue senna 8.6 mg 2 tablets daily and Colace 100 mg twice daily   Hennie Duos, MD '

## 2019-02-22 ENCOUNTER — Encounter: Payer: Self-pay | Admitting: Adult Health

## 2019-02-22 ENCOUNTER — Non-Acute Institutional Stay (SKILLED_NURSING_FACILITY): Payer: Medicare HMO | Admitting: Adult Health

## 2019-02-22 DIAGNOSIS — F0391 Unspecified dementia with behavioral disturbance: Secondary | ICD-10-CM

## 2019-02-22 DIAGNOSIS — F03918 Unspecified dementia, unspecified severity, with other behavioral disturbance: Secondary | ICD-10-CM

## 2019-02-22 DIAGNOSIS — L03115 Cellulitis of right lower limb: Secondary | ICD-10-CM | POA: Insufficient documentation

## 2019-02-22 DIAGNOSIS — J189 Pneumonia, unspecified organism: Secondary | ICD-10-CM | POA: Insufficient documentation

## 2019-02-22 DIAGNOSIS — F419 Anxiety disorder, unspecified: Secondary | ICD-10-CM

## 2019-02-22 DIAGNOSIS — S72001A Fracture of unspecified part of neck of right femur, initial encounter for closed fracture: Secondary | ICD-10-CM | POA: Insufficient documentation

## 2019-02-22 NOTE — Progress Notes (Signed)
Location:    [place] Room Number: 150/P Place of Service:  SNF (31)   CODE STATUS: DNR  Allergies  Allergen Reactions  . Levofloxacin     unknown    Chief Complaint  Patient presents with  . Acute Visit    Med Management    HPI:  We have come together to discuss the use of her xanax and buspar. she has had several falls with 2 major fractures of both hips. She is is on high risk medication xanax. She doe shave episodes of anxiety and yelling out. The concern is with the xanax. This medication will need to be stopped.   Past Medical History:  Diagnosis Date  . Alcohol abuse 02/07/2018  . Atrial fibrillation ([place])   . Atrial fibrillation, permanent ([place]) 02/13/2019  . CHF (congestive heart failure) ([place]) 02/07/2018  . Dementia ([place]) 02/07/2018  . Dysphagia   . GERD (gastroesophageal reflux disease)   . High cholesterol   . Hypertension   . Hypokalemia   . Lung cancer ([place])   . Psychosis ([place]) 04/08/2018  . Seizures ([place]) 02/07/2018  . Thyroid disease   . TIA (transient ischemic attack)     Past Surgical History:  Procedure Laterality Date  . cardiac implants and grafts    . HIP ARTHROPLASTY Right 02/12/2019   Procedure: ARTHROPLASTY BIPOLAR HIP (HEMIARTHROPLASTY);  Surgeon: Marybelle Killings, MD;  Location: [place];  Service: Orthopedics;  Laterality: Right;  . LOBECTOMY    . REPLACEMENT TOTAL KNEE Bilateral   . TOTAL HIP ARTHROPLASTY Left 10/05/2018   Procedure: HEMI HIP ARTHROPLASTY ANTERIOR APPROACH;  Surgeon: Leandrew Koyanagi, MD;  Location: [place];  Service: Orthopedics;  Laterality: Left;    Social History   Socioeconomic History  . Marital status: Widowed    Spouse name: Not on file  . Number of children: Not on file  . Years of education: Not on file  . Highest education level: Not on file  Occupational History  . Occupation: retired   Tobacco Use  . Smoking status: Never Smoker  . Smokeless tobacco: Never Used  Substance and  Sexual Activity  . Alcohol use: Not Currently  . Drug use: No  . Sexual activity: Not Currently  Other Topics Concern  . Not on file  Social History Narrative   Long term resident of [facility]   Unable to participate in questions    Social Determinants of Health   Financial Resource Strain:   . Difficulty of Paying Living Expenses: Not on file  Food Insecurity:   . Worried About Charity fundraiser in the Last Year: Not on file  . Ran Out of Food in the Last Year: Not on file  Transportation Needs:   . Lack of Transportation (Medical): Not on file  . Lack of Transportation (Non-Medical): Not on file  Physical Activity:   . Days of Exercise per Week: Not on file  . Minutes of Exercise per Session: Not on file  Stress:   . Feeling of Stress : Not on file  Social Connections:   . Frequency of Communication with Friends and Family: Not on file  . Frequency of Social Gatherings with Friends and Family: Not on file  . Attends Religious Services: Not on file  . Active Member of Clubs or Organizations: Not on file  . Attends Archivist Meetings: Not on file  . Marital Status: Not on file  Intimate Partner Violence:   . Fear  of Current or Ex-Partner: Not on file  . Emotionally Abused: Not on file  . Physically Abused: Not on file  . Sexually Abused: Not on file   Family History  Problem Relation Age of Onset  . Heart attack Father        85s      VITAL SIGNS BP 102/60   Pulse 78   Temp 98.1 F (36.7 C) (Oral)   Resp 19   Ht 5\' 2"  (1.575 m)   Wt 90 lb 8 oz (41.1 kg)   BMI 16.55 kg/m   Outpatient Encounter Medications as of 02/22/2019  Medication Sig  . acetaminophen (TYLENOL) 325 MG tablet Take 650 mg by mouth 3 (three) times daily as needed.  . ALPRAZolam (XANAX) 0.25 MG tablet Take 0.25 mg by mouth. Daily at bedtime every other day  . Amino Acids-Protein Hydrolys (FEEDING SUPPLEMENT, PRO-STAT SUGAR FREE 64,) LIQD Take 30 mLs by mouth 2 (two) times daily  between meals.  Marland Kitchen aspirin 325 MG tablet Take 1 tablet (325 mg total) by mouth daily.  Roseanne Kaufman Peru-Castor Oil (VENELEX) OINT Apply 1 application topically See admin instructions. Apply to sacrum, coccyx, and bilateral buttocks every shift and as needed for wound  . bisacodyl (DULCOLAX) 10 MG suppository Place 1 suppository (10 mg total) rectally daily as needed for moderate constipation.  . busPIRone (BUSPAR) 5 MG tablet Take 5 mg by mouth 3 (three) times daily.   . cefTRIAXone (ROCEPHIN) 1 g injection Inject 1 g into the muscle daily.  . Cholecalciferol 1.25 MG (50000 UT) capsule Take 50,000 Units by mouth. Once a day on Thursday  . diltiazem (CARDIZEM) 90 MG tablet Take 1 tablet (90 mg total) by mouth every 8 (eight) hours.  . docusate (COLACE) 50 MG/5ML liquid Take 10 mLs (100 mg total) by mouth 2 (two) times daily.  Marland Kitchen doxycycline (VIBRAMYCIN) 100 MG capsule Take 100 mg by mouth 2 (two) times daily.  . Eyelid Cleansers (OCUSOFT EYELID CLEANSING) PADS Apply 1 application topically 2 (two) times daily. cleanse both eyes BID Twice A Day 09:00 AM, 09:00 PM   . levothyroxine (SYNTHROID, LEVOTHROID) 50 MCG tablet Take 50 mcg by mouth daily before breakfast.  . NON FORMULARY Diet Change: Dysphagia 1 (puree), thin liquids  . NON FORMULARY Magic cup daily- for increased nutrition intake Once A Day 02:00 PM  . polyethylene glycol (MIRALAX / GLYCOLAX) 17 g packet Take 17 g by mouth daily as needed for mild constipation.  . QUEtiapine Fumarate (SEROQUEL PO) Take 12.5 mg by mouth 2 (two) times daily.   Marland Kitchen senna (SENOKOT) 8.6 MG TABS tablet Take 2 tablets (17.2 mg total) by mouth daily.  . traMADol (ULTRAM) 50 MG tablet Take 2 tablets (100 mg total) by mouth every 8 (eight) hours.  . traMADol (ULTRAM) 50 MG tablet Take 100 mg by mouth daily as needed.  . zinc sulfate 220 (50 Zn) MG capsule Take 220 mg by mouth daily.  . [DISCONTINUED] ALPRAZolam (XANAX) 0.25 MG tablet Take 1 tablet (0.25 mg total) by  mouth at bedtime. (Patient taking differently: Take 0.25 mg by mouth at bedtime. Every other day)  . [DISCONTINUED] traMADol (ULTRAM) 50 MG tablet Take 2 tablets (100 mg total) by mouth daily. 30' prior to physical therapy (Patient taking differently: Take 100 mg by mouth daily as needed. 36' prior to physical therapy)   No facility-administered encounter medications on file as of 02/22/2019.     SIGNIFICANT DIAGNOSTIC EXAMS  PREVIOUS  10-03-18: left hip and pelvis x-ray: Mildly comminuted, foreshortened and slightly valgus angulated transcervical left femoral neck fracture. Remote appearing posttraumatic deformity of the left inferior and superior pubic ramus  10-03-18: chest x-ray:  1. No active disease.  Likely chronic coarse interstitial changes. 2. Question projectional irregularity versus fracture of the left humerus. Correlate with point tenderness and consider dedicated shoulder radiographs if there is clinical concern  10-03-18: left humerus x-ray: Age indeterminate fracture of the left humeral neck. Clinical correlation is recommended.  10-05-18: pelvic x-ray:  1. Interval left hip replacement with expected surgical change 2. Chronic deformities of the left superior and inferior pubic rami  02-11-19: chest x-ray: No evidence of acute cardiopulmonary disease   02-11-19: right hip x-ray: Displaced right femoral neck fracture.   02-19-28: chest x-ray:Changes of bibasilar pneumonia. Consider possible development of perihilar bronchopneumonia.  NO NEW EXAMS.       LABS REVIEWED PREVIOUS;   03-17-18: wbc 5.7; hgb 12.4; hct 38.8; mcv 108.1; plt 224; glucose 85; bun 13; creat 0.91; k+ 3.3; na++ 138; ca 8.7 protein 5.7 albumin 3.1 hgb 4.8 03-20-18: glucose 74; bun 14; creat 0.81; k+ 3.5; na++ 137; ca 9.2 04-19-28: tsh 93.852 05-18-18: tsh 31.786 06-29-18: vit B 12: 308; tsh 14.789 07-11-18: wbc 4.7; hgb 12.9; hct 39.1; mcv 104.5; plt 146 ; glucose 89; bun 24; creat 0.74; k+ 3.9;  na++ 141; ca 9.0 liver normal albumin 3.2 tsh 13.945 free T4: 0.78 08-01-18: tsh 6.297 free T3; 2.2 free T4: 1.13 10-03-18: wbc 8.4; hgb 15.6; hct 48.0; mcv 99.2 plt 160; glucose 124; bun 30; creat 0.81; k+ 4.3; na++ 142; ca 9.6 10-06-18: wbc 9.9; hgb 15.3; hct 46.1; mcv 99.1; plt 121; glucose 134; bun 15; creat 0.66; k+ 3.8; na++ 140; ca 8.8 10-09-18: wbc 7.8; hgb 13.2; hct 39.0; mcv 98.7 plt 135 01-26-19: wbc 5.8; hgb 13.6; hct  42.3; mcv 104.7 plt 145; iron 129 tibc 377; vit B 12: 392; folate 6.6; vit B 1: 90.0 02-11-19: wbc 9.2; hgb 14.7; hct 43.7;mcv 102.1 plt 144; glucose 122; bun 22; creat 0.64; k+ 4.3; na++ 139; ca 9.3 02-14-19: wbc 9.6; hgb 12.6; hct 36.9; mcv 102.2 plt 145; glucose 121; bun 21; creat 0.89; k+ 4.0; na++ 144;ca 8.9  02-19-19: wbc 9.8; hgb 13.5; hct 40.6; mcv 103.0 plt 227; glucose 97; bun 16; creat 0.76; k+ 3.3; na++ 145; ca 9.5   NO NEW LABS.    Review of Systems  Unable to perform ROS: Dementia (unable to participate )    Physical Exam Constitutional:      General: She is not in acute distress.    Appearance: She is cachectic. She is ill-appearing. She is not diaphoretic.  Neck:     Thyroid: No thyromegaly.  Cardiovascular:     Rate and Rhythm: Normal rate and regular rhythm.     Pulses: Normal pulses.     Heart sounds: Normal heart sounds.     Comments: History of cardiac stent Pulmonary:     Effort: Pulmonary effort is normal. No respiratory distress.     Breath sounds: Normal breath sounds.  Abdominal:     General: Bowel sounds are normal. There is no distension.     Palpations: Abdomen is soft.     Tenderness: There is no abdominal tenderness.  Genitourinary:    Comments: Foley  Musculoskeletal:     Cervical back: Neck supple.     Right lower leg: No edema.     Left lower leg: No  edema.     Comments:  Is able to move all extremities  Is status post right hip hemi-arthroplasty 02-12-19 Is status post left hip hemi arthroplasty 10-05-18 Is status post  left humeral fracture History of bilateral knee replacements     Lymphadenopathy:     Cervical: No cervical adenopathy.  Skin:    General: Skin is warm and dry.     Comments: Right hip incision line with less redness present   Neurological:     Mental Status: She is alert. Mental status is at baseline.  Psychiatric:        Mood and Affect: Mood normal.       ASSESSMENT/ PLAN:  TODAY  1. Psychosis in elderly with behavioral disturbance 2. Chronic anxiety  Will continue seroquel 12.5 mg twice daily  Will lower xanax to 0.25 mg every other night for 3 doses then stop Will increase buspar to 5 mg three times daily  Will monitor her status.   MD is aware of resident's narcotic use and is in agreement with current plan of care. We will attempt to wean resident as appropriate.  Ok Edwards NP [facility]  Contact [phone] Monday through Friday 8am- 5pm  After hours call [phone]

## 2019-02-24 ENCOUNTER — Encounter: Payer: Self-pay | Admitting: Internal Medicine

## 2019-02-24 DIAGNOSIS — K5900 Constipation, unspecified: Secondary | ICD-10-CM | POA: Insufficient documentation

## 2019-02-24 DIAGNOSIS — S72011D Unspecified intracapsular fracture of right femur, subsequent encounter for closed fracture with routine healing: Secondary | ICD-10-CM | POA: Insufficient documentation

## 2019-02-24 LAB — CULTURE, BLOOD (ROUTINE X 2)
Culture: NO GROWTH
Culture: NO GROWTH
Special Requests: ADEQUATE
Special Requests: ADEQUATE

## 2019-02-26 ENCOUNTER — Encounter: Payer: Self-pay | Admitting: Adult Health

## 2019-02-26 ENCOUNTER — Non-Acute Institutional Stay (SKILLED_NURSING_FACILITY): Payer: Medicare HMO | Admitting: Adult Health

## 2019-02-26 DIAGNOSIS — S72001A Fracture of unspecified part of neck of right femur, initial encounter for closed fracture: Secondary | ICD-10-CM | POA: Diagnosis not present

## 2019-02-26 DIAGNOSIS — E039 Hypothyroidism, unspecified: Secondary | ICD-10-CM | POA: Diagnosis not present

## 2019-02-26 DIAGNOSIS — J189 Pneumonia, unspecified organism: Secondary | ICD-10-CM

## 2019-02-26 DIAGNOSIS — F0390 Unspecified dementia without behavioral disturbance: Secondary | ICD-10-CM

## 2019-02-26 DIAGNOSIS — L03115 Cellulitis of right lower limb: Secondary | ICD-10-CM

## 2019-02-26 DIAGNOSIS — F03C0 Unspecified dementia, severe, without behavioral disturbance, psychotic disturbance, mood disturbance, and anxiety: Secondary | ICD-10-CM

## 2019-02-26 NOTE — Progress Notes (Signed)
Location:    [place] Room Number: 150/P Place of Service:  SNF (31)   CODE STATUS: DNR  Allergies  Allergen Reactions  . Levofloxacin     unknown   Chief Complaint  Patient presents with  . Medical Management of Chronic Issues        Closed displaced right femoral neck fracture: Acquired hypothyroidism:  . Advanced dementia:   HCAP: Right hip cellulitis:  Weekly follow up for the first 30 days post hospitalization      HPI:  She is a 86 year old long term resident of this facility being seen for the management of her chronic illnesses: right hip fracture; hypothyroidism; dementia. She is presently being treated for pneumonia with rocephin and is taking doxycycline for her right hip cellulitis. There are no reports of uncontrolled pain; she is losing weight. There are no reports of agitation or insomnia.    Past Medical History:  Diagnosis Date  . Alcohol abuse 02/07/2018  . Atrial fibrillation ([place])   . Atrial fibrillation, permanent ([place]) 02/13/2019  . CHF (congestive heart failure) ([place]) 02/07/2018  . Dementia ([place]) 02/07/2018  . Dysphagia   . GERD (gastroesophageal reflux disease)   . High cholesterol   . Hypertension   . Hypokalemia   . Lung cancer ([place])   . Psychosis ([place]) 04/08/2018  . Seizures ([place]) 02/07/2018  . Thyroid disease   . TIA (transient ischemic attack)     Past Surgical History:  Procedure Laterality Date  . cardiac implants and grafts    . HIP ARTHROPLASTY Right 02/12/2019   Procedure: ARTHROPLASTY BIPOLAR HIP (HEMIARTHROPLASTY);  Surgeon: Marybelle Killings, MD;  Location: [place];  Service: Orthopedics;  Laterality: Right;  . LOBECTOMY    . REPLACEMENT TOTAL KNEE Bilateral   . TOTAL HIP ARTHROPLASTY Left 10/05/2018   Procedure: HEMI HIP ARTHROPLASTY ANTERIOR APPROACH;  Surgeon: Leandrew Koyanagi, MD;  Location: [place];  Service: Orthopedics;  Laterality: Left;    Social History   Socioeconomic History  . Marital status:  Widowed    Spouse name: Not on file  . Number of children: Not on file  . Years of education: Not on file  . Highest education level: Not on file  Occupational History  . Occupation: retired   Tobacco Use  . Smoking status: Never Smoker  . Smokeless tobacco: Never Used  Substance and Sexual Activity  . Alcohol use: Not Currently  . Drug use: No  . Sexual activity: Not Currently  Other Topics Concern  . Not on file  Social History Narrative   Long term resident of [facility]   Unable to participate in questions    Social Determinants of Health   Financial Resource Strain:   . Difficulty of Paying Living Expenses: Not on file  Food Insecurity:   . Worried About Charity fundraiser in the Last Year: Not on file  . Ran Out of Food in the Last Year: Not on file  Transportation Needs:   . Lack of Transportation (Medical): Not on file  . Lack of Transportation (Non-Medical): Not on file  Physical Activity:   . Days of Exercise per Week: Not on file  . Minutes of Exercise per Session: Not on file  Stress:   . Feeling of Stress : Not on file  Social Connections:   . Frequency of Communication with Friends and Family: Not on file  . Frequency of Social Gatherings with Friends and Family: Not on file  .  Attends Religious Services: Not on file  . Active Member of Clubs or Organizations: Not on file  . Attends Archivist Meetings: Not on file  . Marital Status: Not on file  Intimate Partner Violence:   . Fear of Current or Ex-Partner: Not on file  . Emotionally Abused: Not on file  . Physically Abused: Not on file  . Sexually Abused: Not on file   Family History  Problem Relation Age of Onset  . Heart attack Father        80s      VITAL SIGNS BP (!) 145/71   Pulse 64   Temp 98.6 F (37 C) (Oral)   Resp 20   Ht 5' 0.2" (1.529 m)   Wt 84 lb 3.2 oz (38.2 kg)   BMI 16.34 kg/m   Outpatient Encounter Medications as of 02/26/2019  Medication Sig  . acetaminophen  (TYLENOL) 325 MG tablet Take 650 mg by mouth 3 (three) times daily as needed.  . ALPRAZolam (XANAX) 0.25 MG tablet Take 0.25 mg by mouth. Daily at bedtime every other day  . Amino Acids-Protein Hydrolys (FEEDING SUPPLEMENT, PRO-STAT SUGAR FREE 64,) LIQD Take 30 mLs by mouth 2 (two) times daily between meals.  Marland Kitchen aspirin 325 MG tablet Take 1 tablet (325 mg total) by mouth daily.  Roseanne Kaufman [place]-Castor Oil (VENELEX) OINT Apply 1 application topically See admin instructions. Apply to sacrum, coccyx, and bilateral buttocks every shift and as needed for wound  . bisacodyl (DULCOLAX) 10 MG suppository Place 1 suppository (10 mg total) rectally daily as needed for moderate constipation.  . busPIRone (BUSPAR) 5 MG tablet Take 5 mg by mouth 3 (three) times daily.   . cefTRIAXone (ROCEPHIN) 1 g injection Inject 1 g into the muscle daily.  . Cholecalciferol 1.25 MG (50000 UT) capsule Take 50,000 Units by mouth. Once a day on Thursday  . diltiazem (CARDIZEM) 90 MG tablet Take 1 tablet (90 mg total) by mouth every 8 (eight) hours.  . docusate (COLACE) 50 MG/5ML liquid Take 10 mLs (100 mg total) by mouth 2 (two) times daily.  Marland Kitchen doxycycline (VIBRAMYCIN) 100 MG capsule Take 100 mg by mouth 2 (two) times daily.  . Eyelid Cleansers (OCUSOFT EYELID CLEANSING) PADS Apply 1 application topically 2 (two) times daily. cleanse both eyes BID Twice A Day 09:00 AM, 09:00 PM   . levothyroxine (SYNTHROID, LEVOTHROID) 50 MCG tablet Take 50 mcg by mouth daily before breakfast.  . NON FORMULARY Diet Change: Dysphagia 1 (puree), thin liquids  . NON FORMULARY Magic cup daily- for increased nutrition intake Once A Day 02:00 PM  . polyethylene glycol (MIRALAX / GLYCOLAX) 17 g packet Take 17 g by mouth daily as needed for mild constipation.  . QUEtiapine Fumarate (SEROQUEL PO) Take 12.5 mg by mouth 2 (two) times daily.   Marland Kitchen senna (SENOKOT) 8.6 MG TABS tablet Take 2 tablets (17.2 mg total) by mouth daily.  . traMADol (ULTRAM) 50 MG  tablet Take 2 tablets (100 mg total) by mouth every 8 (eight) hours.  Marland Kitchen zinc sulfate 220 (50 Zn) MG capsule Take 220 mg by mouth daily.  . [DISCONTINUED] traMADol (ULTRAM) 50 MG tablet Take 100 mg by mouth daily as needed.   No facility-administered encounter medications on file as of 02/26/2019.     SIGNIFICANT DIAGNOSTIC EXAMS   PREVIOUS    10-03-18: left hip and pelvis x-ray: Mildly comminuted, foreshortened and slightly valgus angulated transcervical left femoral neck fracture. Remote appearing posttraumatic deformity of  the left inferior and superior pubic ramus  10-03-18: chest x-ray:  1. No active disease.  Likely chronic coarse interstitial changes. 2. Question projectional irregularity versus fracture of the left humerus. Correlate with point tenderness and consider dedicated shoulder radiographs if there is clinical concern  10-03-18: left humerus x-ray: Age indeterminate fracture of the left humeral neck. Clinical correlation is recommended.  10-05-18: pelvic x-ray:  1. Interval left hip replacement with expected surgical change 2. Chronic deformities of the left superior and inferior pubic rami  02-11-19: chest x-ray: No evidence of acute cardiopulmonary disease   02-11-19: right hip x-ray: Displaced right femoral neck fracture.   02-19-28: chest x-ray:Changes of bibasilar pneumonia. Consider possible development of perihilar bronchopneumonia.  NO NEW EXAMS.       LABS REVIEWED PREVIOUS;   03-17-18: wbc 5.7; hgb 12.4; hct 38.8; mcv 108.1; plt 224; glucose 85; bun 13; creat 0.91; k+ 3.3; na++ 138; ca 8.7 protein 5.7 albumin 3.1 hgb 4.8 03-20-18: glucose 74; bun 14; creat 0.81; k+ 3.5; na++ 137; ca 9.2 04-19-28: tsh 93.852 05-18-18: tsh 31.786 06-29-18: vit B 12: 308; tsh 14.789 07-11-18: wbc 4.7; hgb 12.9; hct 39.1; mcv 104.5; plt 146 ; glucose 89; bun 24; creat 0.74; k+ 3.9; na++ 141; ca 9.0 liver normal albumin 3.2 tsh 13.945 free T4: 0.78 08-01-18: tsh 6.297 free T3; 2.2  free T4: 1.13 10-03-18: wbc 8.4; hgb 15.6; hct 48.0; mcv 99.2 plt 160; glucose 124; bun 30; creat 0.81; k+ 4.3; na++ 142; ca 9.6 10-06-18: wbc 9.9; hgb 15.3; hct 46.1; mcv 99.1; plt 121; glucose 134; bun 15; creat 0.66; k+ 3.8; na++ 140; ca 8.8 10-09-18: wbc 7.8; hgb 13.2; hct 39.0; mcv 98.7 plt 135 01-26-19: wbc 5.8; hgb 13.6; hct  42.3; mcv 104.7 plt 145; iron 129 tibc 377; vit B 12: 392; folate 6.6; vit B 1: 90.0 02-11-19: wbc 9.2; hgb 14.7; hct 43.7;mcv 102.1 plt 144; glucose 122; bun 22; creat 0.64; k+ 4.3; na++ 139; ca 9.3 02-14-19: wbc 9.6; hgb 12.6; hct 36.9; mcv 102.2 plt 145; glucose 121; bun 21; creat 0.89; k+ 4.0; na++ 144;ca 8.9  02-19-19: wbc 9.8; hgb 13.5; hct 40.6; mcv 103.0 plt 227; glucose 97; bun 16; creat 0.76; k+ 3.3; na++ 145; ca 9.5   NO NEW LABS.    Review of Systems  Unable to perform ROS: Dementia (unable to participate )   Physical Exam Constitutional:      General: She is not in acute distress.    Appearance: She is cachectic. She is ill-appearing. She is not diaphoretic.  Neck:     Thyroid: No thyromegaly.  Cardiovascular:     Rate and Rhythm: Normal rate and regular rhythm.     Pulses: Normal pulses.     Heart sounds: Normal heart sounds.     Comments: History of cardiac stent Pulmonary:     Effort: Pulmonary effort is normal. No respiratory distress.     Breath sounds: Normal breath sounds.  Abdominal:     General: Bowel sounds are normal. There is no distension.     Palpations: Abdomen is soft.     Tenderness: There is no abdominal tenderness.  Genitourinary:    Comments: Foley  Musculoskeletal:     Cervical back: Neck supple.     Right lower leg: No edema.     Left lower leg: No edema.     Comments: Is able to move all extremities  Is status post right hip hemi-arthroplasty 02-12-19 Is status post left  hip hemi arthroplasty 10-05-18 Is status post left humeral fracture History of bilateral knee replacements      Lymphadenopathy:     Cervical: No  cervical adenopathy.  Skin:    General: Skin is warm and dry.     Comments: Right hip incision line without signs of infection present   Neurological:     Mental Status: She is alert. Mental status is at baseline.  Psychiatric:        Mood and Affect: Mood normal.        ASSESSMENT/ PLAN:   TODAY;   1. Closed displaced right femoral neck fracture: is stable her pain is being managed with ultram 100 mg every 8 hours;   2. Acquired hypothyroidism: is stable tsh 6.297 ( free t3: 2.2 free t4: 1.13) will continue synthroid 50 mcg daily   3. Advanced dementia: is declining: weight is 84 pounds has had 2 hip fractures this past year; weight loss is an unfortunate outcome in the last stages of this disease process.   4. HCAP: is stable will complete rocephin and will continue to monitor her status   5. Right hip cellulitis: is stable will complete doxycycline and will monitor her status.   PREVIOUS   6. Seizure is stable no recent seizures will monitor   7. Psychosis in elderly with behavioral disturbance: is stable will continue seroquel 12.5 mg twice daily   8 . Chronic anxiety: is stable will continue buspar 5 mg three times  daily and xanax 0.25 mg every other night for 3 doses then stop   9. Unspecified chronic CHF (Congestive heart failure) is compensated will monitor   10.  Atrial fibrillation with RVR: heart rate is stable will continue cardizem 90 mg three times daily   11. Closed nondisplaced fracture of surgical neck of left humerus unspecified morphology/closed displaced fracture of left femoral neck is stable is on ultram 100 mg every 8 hours     MD is aware of resident's narcotic use and is in agreement with current plan of care. We will attempt to wean resident as appropriate.  Ok Edwards NP [facility]  Contact [phone] Monday through Friday 8am- 5pm  After hours call [phone]

## 2019-02-27 ENCOUNTER — Inpatient Hospital Stay (INDEPENDENT_AMBULATORY_CARE_PROVIDER_SITE_OTHER): Payer: Medicare HMO

## 2019-02-27 ENCOUNTER — Ambulatory Visit (INDEPENDENT_AMBULATORY_CARE_PROVIDER_SITE_OTHER): Payer: Medicare HMO | Admitting: Orthopaedic Surgery

## 2019-02-27 ENCOUNTER — Encounter: Payer: Self-pay | Admitting: Orthopaedic Surgery

## 2019-02-27 VITALS — Ht 60.2 in | Wt 84.0 lb

## 2019-02-27 DIAGNOSIS — S72002A Fracture of unspecified part of neck of left femur, initial encounter for closed fracture: Secondary | ICD-10-CM

## 2019-02-27 DIAGNOSIS — S72001A Fracture of unspecified part of neck of right femur, initial encounter for closed fracture: Secondary | ICD-10-CM

## 2019-02-27 NOTE — Progress Notes (Signed)
Post-Op Visit Note   Patient: Casey Wood           Date of Birth: 11-04-1922           MRN: [id number] Visit Date: 02/27/2019 PCP: Hennie Duos, MD   Assessment & Plan: Postop hemiarthroplasty right hip.  Patient has a seroma and after the first 7-10 staples were removed patient had some serous drainage and we left the remaining staples then.  Betadine prep aspiration of the seroma 60 cc serosanguineous fluid no evidence of infection.  Fluid was clear.  She can return in 1 week for incision check we will leave the remaining staples and until I see her next week.  X-ray showed good position of the prosthesis.  Chief Complaint:  Chief Complaint  Patient presents with  . Right Hip - Routine Post Op    02/12/2019 Right Hip Hemiarthroplasty   Visit Diagnoses:  1. Left displaced femoral neck fracture (HCC)     Plan: Return in 1 week for wound check and possible staple removal versus reaspiration of the seroma.  Follow-Up Instructions: No follow-ups on file.   Orders:  Orders Placed This Encounter  Procedures  . XR HIP UNILAT W OR W/O PELVIS 2-3 VIEWS RIGHT   No orders of the defined types were placed in this encounter.   Imaging: XR HIP UNILAT W OR W/O PELVIS 2-3 VIEWS RIGHT  Result Date: 02/27/2019 AP frog-leg lateral right hip obtained reviewed this shows well-positioned press-fit hemiarthroplasty. Impression: Satisfactory right hip hemiarthroplasty.   PMFS History: Patient Active Problem List   Diagnosis Date Noted  . Subcapital fracture of femur, right, closed, with routine healing, subsequent encounter 02/24/2019  . Constipation 02/24/2019  . Closed displaced fracture of right femoral neck ([place]) 02/22/2019  . HCAP (healthcare-associated pneumonia) 02/22/2019  . Cellulitis of right hip 02/22/2019  . Hip fracture ([place]) 02/11/2019  . Medicare annual wellness visit, subsequent 01/23/2019  . Closed left humeral fracture 10/14/2018  . Goals of care,  counseling/discussion   . Palliative care by specialist   . Left displaced femoral neck fracture ([place]) 10/03/2018  . Left ankle sprain 06/02/2018  . Chronic anxiety 05/11/2018  . Acquired hypothyroidism 04/25/2018  . Psychosis in elderly with behavioral disturbance ([place]) 04/08/2018  . Atrial fibrillation with RVR ([place]) 02/07/2018  . Advanced dementia ([place]) 02/07/2018  . Seizures ([place]) 02/07/2018  . Alcohol abuse 02/07/2018  . CHF (congestive heart failure) ([place]) 02/07/2018   Past Medical History:  Diagnosis Date  . Alcohol abuse 02/07/2018  . Atrial fibrillation ([place])   . Atrial fibrillation, permanent ([place]) 02/13/2019  . CHF (congestive heart failure) ([place]) 02/07/2018  . Dementia ([place]) 02/07/2018  . Dysphagia   . GERD (gastroesophageal reflux disease)   . High cholesterol   . Hypertension   . Hypokalemia   . Lung cancer ([place])   . Psychosis ([place]) 04/08/2018  . Seizures ([place]) 02/07/2018  . Thyroid disease   . TIA (transient ischemic attack)     Family History  Problem Relation Age of Onset  . Heart attack Father        79s    Past Surgical History:  Procedure Laterality Date  . cardiac implants and grafts    . HIP ARTHROPLASTY Right 02/12/2019   Procedure: ARTHROPLASTY BIPOLAR HIP (HEMIARTHROPLASTY);  Surgeon: Marybelle Killings, MD;  Location: [facility];  Service: Orthopedics;  Laterality: Right;  . LOBECTOMY    . REPLACEMENT TOTAL KNEE Bilateral   . TOTAL HIP ARTHROPLASTY Left  10/05/2018   Procedure: HEMI HIP ARTHROPLASTY ANTERIOR APPROACH;  Surgeon: Leandrew Koyanagi, MD;  Location: [facility];  Service: Orthopedics;  Laterality: Left;   Social History   Occupational History  . Occupation: retired   Tobacco Use  . Smoking status: Never Smoker  . Smokeless tobacco: Never Used  Substance and Sexual Activity  . Alcohol use: Not Currently  . Drug use: No  . Sexual activity: Not Currently

## 2019-02-28 ENCOUNTER — Telehealth: Payer: Self-pay | Admitting: Orthopaedic Surgery

## 2019-02-28 ENCOUNTER — Non-Acute Institutional Stay (SKILLED_NURSING_FACILITY): Payer: Medicare HMO | Admitting: Adult Health

## 2019-02-28 ENCOUNTER — Encounter: Payer: Self-pay | Admitting: Adult Health

## 2019-02-28 DIAGNOSIS — F03918 Unspecified dementia, unspecified severity, with other behavioral disturbance: Secondary | ICD-10-CM

## 2019-02-28 DIAGNOSIS — F0390 Unspecified dementia without behavioral disturbance: Secondary | ICD-10-CM | POA: Diagnosis not present

## 2019-02-28 DIAGNOSIS — F0391 Unspecified dementia with behavioral disturbance: Secondary | ICD-10-CM | POA: Diagnosis not present

## 2019-02-28 DIAGNOSIS — F03C0 Unspecified dementia, severe, without behavioral disturbance, psychotic disturbance, mood disturbance, and anxiety: Secondary | ICD-10-CM

## 2019-02-28 DIAGNOSIS — I4891 Unspecified atrial fibrillation: Secondary | ICD-10-CM

## 2019-02-28 NOTE — Telephone Encounter (Signed)
Casey Wood from [facility] called requesting verbal orders from Dr. Lorin Mercy office for staples to be removed from nursing staff at [facility]. Patient tested positive for Covid-19. Casey Wood asked for orders to be faxed to [phone] and also permission for appointment for patient to be cancelled. Casey Wood stated if call back leave detailed message. Casey Wood phone number is [phone].

## 2019-02-28 NOTE — Telephone Encounter (Signed)
Please advise 

## 2019-02-28 NOTE — Telephone Encounter (Signed)
noted 

## 2019-02-28 NOTE — Progress Notes (Signed)
Location:    [place] Room Number: 134/P Place of Service:  SNF (31)   CODE STATUS: DNR  Allergies  Allergen Reactions  . Levofloxacin     unknown   Chief Complaint  Patient presents with  . Acute Visit    care plan meeting      HPI:  We have come together for her care plan meeting. BIMS 0/15 mood 0/30. She has family present. She has had a recent right hip fracture. She had 60 cc seroma aspirated this past week. She is off ativan at this time. She is being seen by pt/ot. She has lost weight from 90 to her current weight of 84 pounds. There are no reports of uncontrolled pain. She continues to be followed for her chronic illnesses including: afib; dementia; psychosis   Past Medical History:  Diagnosis Date  . Alcohol abuse 02/07/2018  . Atrial fibrillation ([place])   . Atrial fibrillation, permanent ([place]) 02/13/2019  . CHF (congestive heart failure) ([place]) 02/07/2018  . Dementia ([place]) 02/07/2018  . Dysphagia   . GERD (gastroesophageal reflux disease)   . High cholesterol   . Hypertension   . Hypokalemia   . Lung cancer ([place])   . Psychosis ([place]) 04/08/2018  . Seizures ([place]) 02/07/2018  . Thyroid disease   . TIA (transient ischemic attack)     Past Surgical History:  Procedure Laterality Date  . cardiac implants and grafts    . HIP ARTHROPLASTY Right 02/12/2019   Procedure: ARTHROPLASTY BIPOLAR HIP (HEMIARTHROPLASTY);  Surgeon: Marybelle Killings, MD;  Location: [facility];  Service: Orthopedics;  Laterality: Right;  . LOBECTOMY    . REPLACEMENT TOTAL KNEE Bilateral   . TOTAL HIP ARTHROPLASTY Left 10/05/2018   Procedure: HEMI HIP ARTHROPLASTY ANTERIOR APPROACH;  Surgeon: Leandrew Koyanagi, MD;  Location: [facility];  Service: Orthopedics;  Laterality: Left;    Social History   Socioeconomic History  . Marital status: Widowed    Spouse name: Not on file  . Number of children: Not on file  . Years of education: Not on file  . Highest education level: Not on  file  Occupational History  . Occupation: retired   Tobacco Use  . Smoking status: Never Smoker  . Smokeless tobacco: Never Used  Substance and Sexual Activity  . Alcohol use: Not Currently  . Drug use: No  . Sexual activity: Not Currently  Other Topics Concern  . Not on file  Social History Narrative   [facility] resident of [facility]   Unable to participate in questions    Social Determinants of Health   Financial Resource Strain:   . Difficulty of Paying Living Expenses: Not on file  Food Insecurity:   . Worried About Charity fundraiser in the Last Year: Not on file  . Ran Out of Food in the Last Year: Not on file  Transportation Needs:   . Lack of Transportation (Medical): Not on file  . Lack of Transportation (Non-Medical): Not on file  Physical Activity:   . Days of Exercise per Week: Not on file  . Minutes of Exercise per Session: Not on file  Stress:   . Feeling of Stress : Not on file  Social Connections:   . Frequency of Communication with Friends and Family: Not on file  . Frequency of Social Gatherings with Friends and Family: Not on file  . Attends Religious Services: Not on file  . Active Member of Clubs or Organizations: Not  on file  . Attends Archivist Meetings: Not on file  . Marital Status: Not on file  Intimate Partner Violence:   . Fear of Current or Ex-Partner: Not on file  . Emotionally Abused: Not on file  . Physically Abused: Not on file  . Sexually Abused: Not on file   Family History  Problem Relation Age of Onset  . Heart attack Father        16s      VITAL SIGNS BP 125/63   Pulse 70   Temp (!) 97.2 F (36.2 C) (Oral)   Resp 20   Ht 5\' 2"  (1.575 m)   Wt 84 lb 3.2 oz (38.2 kg)   BMI 15.40 kg/m   Outpatient Encounter Medications as of 02/28/2019  Medication Sig  . acetaminophen (TYLENOL) 325 MG tablet Take 650 mg by mouth 3 (three) times daily as needed.  . ALPRAZolam (XANAX) 0.25 MG tablet Take 0.25 mg by mouth. Daily at  bedtime every other day  . Amino Acids-Protein Hydrolys (FEEDING SUPPLEMENT, PRO-STAT SUGAR FREE 64,) LIQD Take 30 mLs by mouth 2 (two) times daily between meals.  Marland Kitchen aspirin 325 MG tablet Take 1 tablet (325 mg total) by mouth daily.  Roseanne Kaufman Peru-Castor Oil (VENELEX) OINT Apply 1 application topically See admin instructions. Apply to sacrum, coccyx, and bilateral buttocks every shift and as needed for wound  . bisacodyl (DULCOLAX) 10 MG suppository Place 1 suppository (10 mg total) rectally daily as needed for moderate constipation.  . busPIRone (BUSPAR) 5 MG tablet Take 5 mg by mouth 3 (three) times daily.   . cefTRIAXone (ROCEPHIN) 1 g injection Inject 1 g into the muscle daily.  . Cholecalciferol 1.25 MG (50000 UT) capsule Take 50,000 Units by mouth. Once a day on Thursday  . diltiazem (CARDIZEM) 90 MG tablet Take 1 tablet (90 mg total) by mouth every 8 (eight) hours.  . docusate (COLACE) 50 MG/5ML liquid Take 10 mLs (100 mg total) by mouth 2 (two) times daily.  . Eyelid Cleansers (OCUSOFT EYELID CLEANSING) PADS Apply 1 application topically 2 (two) times daily. cleanse both eyes BID Twice A Day 09:00 AM, 09:00 PM   . levothyroxine (SYNTHROID, LEVOTHROID) 50 MCG tablet Take 50 mcg by mouth daily before breakfast.  . NON FORMULARY Diet Change: Dysphagia 1 (puree), thin liquids  . NON FORMULARY Magic cup daily- for increased nutrition intake Once A Day 02:00 PM  . polyethylene glycol (MIRALAX / GLYCOLAX) 17 g packet Take 17 g by mouth daily as needed for mild constipation.  . QUEtiapine Fumarate (SEROQUEL PO) Take 12.5 mg by mouth 2 (two) times daily.   Marland Kitchen senna (SENOKOT) 8.6 MG TABS tablet Take 2 tablets (17.2 mg total) by mouth daily.  . traMADol (ULTRAM) 50 MG tablet Take 2 tablets (100 mg total) by mouth every 8 (eight) hours.  Marland Kitchen zinc sulfate 220 (50 Zn) MG capsule Take 220 mg by mouth daily.   No facility-administered encounter medications on file as of 02/28/2019.     SIGNIFICANT  DIAGNOSTIC EXAMS   PREVIOUS    10-03-18: left hip and pelvis x-ray: Mildly comminuted, foreshortened and slightly valgus angulated transcervical left femoral neck fracture. Remote appearing posttraumatic deformity of the left inferior and superior pubic ramus  10-03-18: chest x-ray:  1. No active disease.  Likely chronic coarse interstitial changes. 2. Question projectional irregularity versus fracture of the left humerus. Correlate with point tenderness and consider dedicated shoulder radiographs if there is clinical concern  10-03-18: left humerus x-ray: Age indeterminate fracture of the left humeral neck. Clinical correlation is recommended.  10-05-18: pelvic x-ray:  1. Interval left hip replacement with expected surgical change 2. Chronic deformities of the left superior and inferior pubic rami  02-11-19: chest x-ray: No evidence of acute cardiopulmonary disease   02-11-19: right hip x-ray: Displaced right femoral neck fracture.   02-19-28: chest x-ray:Changes of bibasilar pneumonia. Consider possible development of perihilar bronchopneumonia.  NO NEW EXAMS.       LABS REVIEWED PREVIOUS;   03-17-18: wbc 5.7; hgb 12.4; hct 38.8; mcv 108.1; plt 224; glucose 85; bun 13; creat 0.91; k+ 3.3; na++ 138; ca 8.7 protein 5.7 albumin 3.1 hgb 4.8 03-20-18: glucose 74; bun 14; creat 0.81; k+ 3.5; na++ 137; ca 9.2 04-19-28: tsh 93.852 05-18-18: tsh 31.786 06-29-18: vit B 12: 308; tsh 14.789 07-11-18: wbc 4.7; hgb 12.9; hct 39.1; mcv 104.5; plt 146 ; glucose 89; bun 24; creat 0.74; k+ 3.9; na++ 141; ca 9.0 liver normal albumin 3.2 tsh 13.945 free T4: 0.78 08-01-18: tsh 6.297 free T3; 2.2 free T4: 1.13 10-03-18: wbc 8.4; hgb 15.6; hct 48.0; mcv 99.2 plt 160; glucose 124; bun 30; creat 0.81; k+ 4.3; na++ 142; ca 9.6 10-06-18: wbc 9.9; hgb 15.3; hct 46.1; mcv 99.1; plt 121; glucose 134; bun 15; creat 0.66; k+ 3.8; na++ 140; ca 8.8 10-09-18: wbc 7.8; hgb 13.2; hct 39.0; mcv 98.7 plt 135 01-26-19: wbc 5.8;  hgb 13.6; hct  42.3; mcv 104.7 plt 145; iron 129 tibc 377; vit B 12: 392; folate 6.6; vit B 1: 90.0 02-11-19: wbc 9.2; hgb 14.7; hct 43.7;mcv 102.1 plt 144; glucose 122; bun 22; creat 0.64; k+ 4.3; na++ 139; ca 9.3 02-14-19: wbc 9.6; hgb 12.6; hct 36.9; mcv 102.2 plt 145; glucose 121; bun 21; creat 0.89; k+ 4.0; na++ 144;ca 8.9  02-19-19: wbc 9.8; hgb 13.5; hct 40.6; mcv 103.0 plt 227; glucose 97; bun 16; creat 0.76; k+ 3.3; na++ 145; ca 9.5   NO NEW LABS.    Review of Systems  Unable to perform ROS: Dementia (unable to participate )   Physical Exam Constitutional:      General: She is not in acute distress.    Appearance: She is cachectic. She is not diaphoretic.  Neck:     Thyroid: No thyromegaly.  Cardiovascular:     Rate and Rhythm: Normal rate and regular rhythm.     Pulses: Normal pulses.     Heart sounds: Normal heart sounds.     Comments: History of cardiac stent Pulmonary:     Effort: Pulmonary effort is normal. No respiratory distress.     Breath sounds: Normal breath sounds.  Abdominal:     General: Bowel sounds are normal. There is no distension.     Palpations: Abdomen is soft.     Tenderness: There is no abdominal tenderness.  Genitourinary:    Comments: Foley  Musculoskeletal:        General: Normal range of motion.     Cervical back: Neck supple.     Comments:  Is able to move all extremities  Is status post right hip hemi-arthroplasty 02-12-19 Is status post left hip hemi arthroplasty 10-05-18 Is status post left humeral fracture History of bilateral knee replacements       Lymphadenopathy:     Cervical: No cervical adenopathy.  Skin:    General: Skin is warm and dry.     Comments: Right hip incision line without signs of infection present  Neurological:     Mental Status: She is alert. Mental status is at baseline.  Psychiatric:        Mood and Affect: Mood normal.        ASSESSMENT/ PLAN:  TODAY  1. Atrial fibrillation with RVR 2. Advanced  dementia 3. Psychosis in elderly with behavioral disturbance  Will continue current medications Will continue current plan of care Will continue to monitor her status.     MD is aware of resident's narcotic use and is in agreement with current plan of care. We will attempt to wean resident as appropriate.  Ok Edwards NP [facility]  Contact [phone] Monday through Friday 8am- 5pm  After hours call [phone]

## 2019-02-28 NOTE — Telephone Encounter (Signed)
I called discussed. NP can call me next Tuesday , she usually starts at about 10 AM , she can facetime when she is with pt and so I can see incision and decide thanks.  FYI

## 2019-03-01 ENCOUNTER — Encounter: Payer: Self-pay | Admitting: Adult Health

## 2019-03-05 ENCOUNTER — Encounter: Payer: Self-pay | Admitting: Adult Health

## 2019-03-05 ENCOUNTER — Non-Acute Institutional Stay (SKILLED_NURSING_FACILITY): Payer: Medicare HMO | Admitting: Adult Health

## 2019-03-05 DIAGNOSIS — F419 Anxiety disorder, unspecified: Secondary | ICD-10-CM | POA: Diagnosis not present

## 2019-03-05 DIAGNOSIS — F03918 Unspecified dementia, unspecified severity, with other behavioral disturbance: Secondary | ICD-10-CM

## 2019-03-05 DIAGNOSIS — F0391 Unspecified dementia with behavioral disturbance: Secondary | ICD-10-CM

## 2019-03-05 DIAGNOSIS — I509 Heart failure, unspecified: Secondary | ICD-10-CM

## 2019-03-05 NOTE — Progress Notes (Signed)
Location:  [facility] Room Number: 532D Place of Service:  SNF (31) Phillips Grout NP    CODE STATUS: DNR  Allergies  Allergen Reactions  . Levofloxacin     unknown   Chief Complaint  Patient presents with  . 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      HPI:  She is a 86 year old long term resident of this facility being seen for the management of her chronic illnesses psychosis anxiety chf. There are no reports of uncontrolled pain; no reports of agitation or anxiety. Her appetite remains variable.    Past Medical History:  Diagnosis Date  . Alcohol abuse 02/07/2018  . Atrial fibrillation ([place])   . Atrial fibrillation, permanent ([place]) 02/13/2019  . CHF (congestive heart failure) ([place]) 02/07/2018  . Closed left humeral fracture 10/14/2018  . Dementia ([place]) 02/07/2018  . Dysphagia   . GERD (gastroesophageal reflux disease)   . High cholesterol   . Hypertension   . Hypokalemia   . Left displaced femoral neck fracture ([place]) 10/03/2018  . Lung cancer ([place])   . Psychosis ([place]) 04/08/2018  . Seizures ([place]) 02/07/2018  . Thyroid disease   . TIA (transient ischemic attack)     Past Surgical History:  Procedure Laterality Date  . cardiac implants and grafts    . HIP ARTHROPLASTY Right 02/12/2019   Procedure: ARTHROPLASTY BIPOLAR HIP (HEMIARTHROPLASTY);  Surgeon: Marybelle Killings, MD;  Location: [facility];  Service: Orthopedics;  Laterality: Right;  . LOBECTOMY    . REPLACEMENT TOTAL KNEE Bilateral   . TOTAL HIP ARTHROPLASTY Left 10/05/2018   Procedure: HEMI HIP ARTHROPLASTY ANTERIOR APPROACH;  Surgeon: Leandrew Koyanagi, MD;  Location: [facility];  Service: Orthopedics;  Laterality: Left;    Social History   Socioeconomic History  . Marital status: Widowed    Spouse name:  Not on file  . Number of children: Not on file  . Years of education: Not on file  . Highest education level: Not on file  Occupational History  . Occupation: retired   Tobacco Use  . Smoking status: Never Smoker  . Smokeless tobacco: Never Used  Substance and Sexual Activity  . Alcohol use: Not Currently  . Drug use: No  . Sexual activity: Not Currently  Other Topics Concern  . Not on file  Social History Narrative   Long term resident of [facility]   Unable to participate in questions    Social Determinants of Health   Financial Resource Strain:   . Difficulty of Paying Living Expenses: Not on file  Food Insecurity:   . Worried About Charity fundraiser in the Last Year: Not on file  . Ran Out of Food in the Last Year: Not on file  Transportation Needs:   . Lack of Transportation (Medical): Not on file  . Lack of Transportation (Non-Medical): Not on file  Physical Activity:   . Days of Exercise per Week: Not on file  . Minutes of Exercise per Session: Not on file  Stress:   . Feeling of Stress : Not on file  Social Connections:   . Frequency of Communication with Friends and Family: Not on file  . Frequency of Social Gatherings with Friends and Family: Not on file  .  Attends Religious Services: Not on file  . Active Member of Clubs or Organizations: Not on file  . Attends Archivist Meetings: Not on file  . Marital Status: Not on file  Intimate Partner Violence:   . Fear of Current or Ex-Partner: Not on file  . Emotionally Abused: Not on file  . Physically Abused: Not on file  . Sexually Abused: Not on file   Family History  Problem Relation Age of Onset  . Heart attack Father        48s      VITAL SIGNS BP 112/61   Pulse 67   Temp (!) 97.4 F (36.3 C) (Oral)   Resp 20   Ht 5\' 2"  (1.575 m)   Wt 84 lb 3.2 oz (38.2 kg)   SpO2 95%   BMI 15.40 kg/m   Outpatient Encounter Medications as of 03/05/2019  Medication Sig  . acetaminophen (TYLENOL) 325 MG  tablet Take 650 mg by mouth 3 (three) times daily as needed.  Marland Kitchen aspirin 325 MG tablet Take 1 tablet (325 mg total) by mouth daily.  Roseanne Kaufman Peru-Castor Oil (VENELEX) OINT Apply 1 application topically See admin instructions. Apply to sacrum, coccyx, and bilateral buttocks every shift and as needed for wound  . bisacodyl (DULCOLAX) 10 MG suppository Place 1 suppository (10 mg total) rectally daily as needed for moderate constipation.  . busPIRone (BUSPAR) 5 MG tablet Take 5 mg by mouth 3 (three) times daily.   . cefTRIAXone (ROCEPHIN) 1 g injection Inject 1 g into the muscle daily.  . Cholecalciferol 1.25 MG (50000 UT) capsule Take 50,000 Units by mouth. Once a day on Thursday  . diltiazem (CARDIZEM) 90 MG tablet Take 1 tablet (90 mg total) by mouth every 8 (eight) hours.  . docusate (COLACE) 50 MG/5ML liquid Take 10 mLs (100 mg total) by mouth 2 (two) times daily.  . Eyelid Cleansers (OCUSOFT EYELID CLEANSING) PADS Apply 1 application topically 2 (two) times daily. cleanse both eyes BID Twice A Day 09:00 AM, 09:00 PM   . levothyroxine (SYNTHROID, LEVOTHROID) 50 MCG tablet Take 50 mcg by mouth daily before breakfast.  . NON FORMULARY Diet Change: Dysphagia 1 (puree), thin liquids  . NON FORMULARY Magic cup daily- for increased nutrition intake Once A Day 02:00 PM  . polyethylene glycol (MIRALAX / GLYCOLAX) 17 g packet Take 17 g by mouth daily as needed for mild constipation.  . QUEtiapine Fumarate (SEROQUEL PO) Take 12.5 mg by mouth 2 (two) times daily.   Marland Kitchen senna (SENOKOT) 8.6 MG TABS tablet Take 2 tablets (17.2 mg total) by mouth daily.  . traMADol (ULTRAM) 50 MG tablet Take 2 tablets (100 mg total) by mouth every 8 (eight) hours.  Marland Kitchen zinc sulfate 220 (50 Zn) MG capsule Take 220 mg by mouth daily.  . [DISCONTINUED] ALPRAZolam (XANAX) 0.25 MG tablet Take 0.25 mg by mouth. Daily at bedtime every other day  . [DISCONTINUED] Amino Acids-Protein Hydrolys (FEEDING SUPPLEMENT, PRO-STAT SUGAR FREE  64,) LIQD Take 30 mLs by mouth 2 (two) times daily between meals.   No facility-administered encounter medications on file as of 03/05/2019.     SIGNIFICANT DIAGNOSTIC EXAMS  PREVIOUS    10-03-18: left hip and pelvis x-ray: Mildly comminuted, foreshortened and slightly valgus angulated transcervical left femoral neck fracture. Remote appearing posttraumatic deformity of the left inferior and superior pubic ramus  10-03-18: chest x-ray:  1. No active disease.  Likely chronic coarse interstitial changes. 2. Question projectional irregularity versus  fracture of the left humerus. Correlate with point tenderness and consider dedicated shoulder radiographs if there is clinical concern  10-03-18: left humerus x-ray: Age indeterminate fracture of the left humeral neck. Clinical correlation is recommended.  10-05-18: pelvic x-ray:  1. Interval left hip replacement with expected surgical change 2. Chronic deformities of the left superior and inferior pubic rami  02-11-19: chest x-ray: No evidence of acute cardiopulmonary disease   02-11-19: right hip x-ray: Displaced right femoral neck fracture.   02-19-28: chest x-ray:Changes of bibasilar pneumonia. Consider possible development of perihilar bronchopneumonia.  NO NEW EXAMS.       LABS REVIEWED PREVIOUS;   03-17-18: wbc 5.7; hgb 12.4; hct 38.8; mcv 108.1; plt 224; glucose 85; bun 13; creat 0.91; k+ 3.3; na++ 138; ca 8.7 protein 5.7 albumin 3.1 hgb 4.8 03-20-18: glucose 74; bun 14; creat 0.81; k+ 3.5; na++ 137; ca 9.2 04-19-28: tsh 93.852 05-18-18: tsh 31.786 06-29-18: vit B 12: 308; tsh 14.789 07-11-18: wbc 4.7; hgb 12.9; hct 39.1; mcv 104.5; plt 146 ; glucose 89; bun 24; creat 0.74; k+ 3.9; na++ 141; ca 9.0 liver normal albumin 3.2 tsh 13.945 free T4: 0.78 08-01-18: tsh 6.297 free T3; 2.2 free T4: 1.13 10-03-18: wbc 8.4; hgb 15.6; hct 48.0; mcv 99.2 plt 160; glucose 124; bun 30; creat 0.81; k+ 4.3; na++ 142; ca 9.6 10-06-18: wbc 9.9; hgb 15.3;  hct 46.1; mcv 99.1; plt 121; glucose 134; bun 15; creat 0.66; k+ 3.8; na++ 140; ca 8.8 10-09-18: wbc 7.8; hgb 13.2; hct 39.0; mcv 98.7 plt 135 01-26-19: wbc 5.8; hgb 13.6; hct  42.3; mcv 104.7 plt 145; iron 129 tibc 377; vit B 12: 392; folate 6.6; vit B 1: 90.0 02-11-19: wbc 9.2; hgb 14.7; hct 43.7;mcv 102.1 plt 144; glucose 122; bun 22; creat 0.64; k+ 4.3; na++ 139; ca 9.3 02-14-19: wbc 9.6; hgb 12.6; hct 36.9; mcv 102.2 plt 145; glucose 121; bun 21; creat 0.89; k+ 4.0; na++ 144;ca 8.9  02-19-19: wbc 9.8; hgb 13.5; hct 40.6; mcv 103.0 plt 227; glucose 97; bun 16; creat 0.76; k+ 3.3; na++ 145; ca 9.5   NO NEW LABS.    Review of Systems  Unable to perform ROS: Dementia (unable to participate )    Physical Exam Constitutional:      General: She is not in acute distress.    Appearance: She is cachectic. She is ill-appearing. She is not diaphoretic.  Neck:     Thyroid: No thyromegaly.  Cardiovascular:     Rate and Rhythm: Normal rate and regular rhythm.     Heart sounds: Normal heart sounds.     Comments: History of cardiac stent Pulmonary:     Effort: Pulmonary effort is normal. No respiratory distress.     Breath sounds: Normal breath sounds.  Abdominal:     General: Bowel sounds are normal. There is no distension.     Palpations: Abdomen is soft.     Tenderness: There is no abdominal tenderness.  Genitourinary:    Comments: Foley  Musculoskeletal:     Cervical back: Neck supple.     Right lower leg: No edema.     Left lower leg: No edema.     Comments: Is able to move all extremities  Is status post right hip hemi-arthroplasty 02-12-19 Is status post left hip hemi arthroplasty 10-05-18 Is status post left humeral fracture History of bilateral knee replacements        Lymphadenopathy:     Cervical: No cervical adenopathy.  Skin:  General: Skin is warm and dry.     Comments: Right hip incision line without signs of infection present     Neurological:     Mental Status: She  is alert. Mental status is at baseline.  Psychiatric:        Mood and Affect: Mood normal.     ASSESSMENT/ PLAN:  TODAY;   1. 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 Chronic anxiety: is stable will continue buspar 5 mg three times daily is off benzo  3. Unspecified chronic CHF (congestive heart failure) is compensated will monitor    PREVIOUS   4. Seizure is stable no recent seizures will monitor   5.  Atrial fibrillation with RVR: heart rate is stable will continue cardizem 90 mg three times daily   6. Closed nondisplaced fracture of surgical neck of left humerus unspecified morphology/closed displaced fracture of left femoral neck closed displaced right femoral neck fracture:  is stable is on ultram 100 mg every 8 hours   7. Acquired hypothyroidism: is stable tsh 6.297 ( free t3: 2.2 free t4: 1.13) will continue synthroid 50 mcg daily   8. Advanced dementia: is declining: weight is 84 pounds has had 2 hip fractures this past year; weight loss is an unfortunate outcome in the last stages of this disease process.         MD is aware of resident's narcotic use and is in agreement with current plan of care. We will attempt to wean resident as appropriate.  Ok Edwards NP [facility]  Contact [phone] Monday through Friday 8am- 5pm  After hours call [phone]

## 2019-03-06 ENCOUNTER — Ambulatory Visit: Payer: Medicare HMO | Admitting: Orthopaedic Surgery

## 2019-03-12 ENCOUNTER — Encounter: Payer: Self-pay | Admitting: Adult Health

## 2019-03-12 ENCOUNTER — Non-Acute Institutional Stay (SKILLED_NURSING_FACILITY): Payer: Medicare HMO | Admitting: Adult Health

## 2019-03-12 DIAGNOSIS — E039 Hypothyroidism, unspecified: Secondary | ICD-10-CM

## 2019-03-12 DIAGNOSIS — I4891 Unspecified atrial fibrillation: Secondary | ICD-10-CM | POA: Diagnosis not present

## 2019-03-12 DIAGNOSIS — R569 Unspecified convulsions: Secondary | ICD-10-CM | POA: Diagnosis not present

## 2019-03-12 NOTE — Progress Notes (Signed)
Location:  [facility] Room Number: 572I Place of Service:  SNF (31) Phillips Grout NP    CODE STATUS: DNR  Allergies  Allergen Reactions  . Levofloxacin     unknown   Chief Complaint  Patient presents with  . Medical Management of Chronic Issues        Seizure:  Atrial fibrillation with RVR:   Acquired hypothyroidism   Weekly follow up for the first 30 days post hospitalization.      HPI:  She is a 86 year old long term resident of this facility being seen for the management of her chronic illnesses: seizures; afib; hypothyroidism. There are no reports of changes in appetite; no uncontrolled pain; no reports of agitation.   Past Medical History:  Diagnosis Date  . Alcohol abuse 02/07/2018  . Atrial fibrillation ([place])   . Atrial fibrillation, permanent ([place]) 02/13/2019  . CHF (congestive heart failure) ([place]) 02/07/2018  . Closed left humeral fracture 10/14/2018  . Dementia ([place]) 02/07/2018  . Dysphagia   . GERD (gastroesophageal reflux disease)   . High cholesterol   . Hypertension   . Hypokalemia   . Left displaced femoral neck fracture ([place]) 10/03/2018  . Lung cancer ([place])   . Psychosis ([place]) 04/08/2018  . Seizures ([place]) 02/07/2018  . Thyroid disease   . TIA (transient ischemic attack)     Past Surgical History:  Procedure Laterality Date  . cardiac implants and grafts    . HIP ARTHROPLASTY Right 02/12/2019   Procedure: ARTHROPLASTY BIPOLAR HIP (HEMIARTHROPLASTY);  Surgeon: Marybelle Killings, MD;  Location: [facility];  Service: Orthopedics;  Laterality: Right;  . LOBECTOMY    . REPLACEMENT TOTAL KNEE Bilateral   . TOTAL HIP ARTHROPLASTY Left 10/05/2018   Procedure: HEMI HIP ARTHROPLASTY ANTERIOR APPROACH;  Surgeon: Leandrew Koyanagi, MD;  Location: [facility];  Service: Orthopedics;  Laterality: Left;    Social History   Socioeconomic History  . Marital status: Widowed    Spouse name: Not on file  . Number of children: Not on file  . Years of  education: Not on file  . Highest education level: Not on file  Occupational History  . Occupation: retired   Tobacco Use  . Smoking status: Never Smoker  . Smokeless tobacco: Never Used  Substance and Sexual Activity  . Alcohol use: Not Currently  . Drug use: No  . Sexual activity: Not Currently  Other Topics Concern  . Not on file  Social History Narrative   Long term resident of [facility]   Unable to participate in questions    Social Determinants of Health   Financial Resource Strain:   . Difficulty of Paying Living Expenses: Not on file  Food Insecurity:   . Worried About Charity fundraiser in the Last Year: Not on file  . Ran Out of Food in the Last Year: Not on file  Transportation Needs:   . Lack of Transportation (Medical): Not on file  . Lack of Transportation (Non-Medical): Not on file  Physical Activity:   . Days of Exercise per Week: Not on file  . Minutes of Exercise per Session: Not on file  Stress:   . Feeling of Stress : Not on file  Social Connections:   . Frequency of Communication with Friends and Family: Not on file  . Frequency of Social Gatherings with Friends and Family: Not on file  . Attends Religious Services: Not on file  . Active Member of  Clubs or Organizations: Not on file  . Attends Archivist Meetings: Not on file  . Marital Status: Not on file  Intimate Partner Violence:   . Fear of Current or Ex-Partner: Not on file  . Emotionally Abused: Not on file  . Physically Abused: Not on file  . Sexually Abused: Not on file   Family History  Problem Relation Age of Onset  . Heart attack Father        15s      VITAL SIGNS BP 122/63   Pulse 73   Temp 98 F (36.7 C) (Oral)   Resp 20   Ht 5\' 2"  (1.575 m)   Wt 84 lb 3.2 oz (38.2 kg)   SpO2 95%   BMI 15.40 kg/m   Outpatient Encounter Medications as of 03/12/2019  Medication Sig  . acetaminophen (TYLENOL) 325 MG tablet Take 650 mg by mouth 3 (three) times daily as needed.  .  Amino Acids-Protein Hydrolys (FEEDING SUPPLEMENT, PRO-STAT SUGAR FREE 64,) LIQD Take 30 mLs by mouth 2 (two) times daily between meals.  Marland Kitchen aspirin 325 MG tablet Take 1 tablet (325 mg total) by mouth daily.  Roseanne Kaufman [place]-Castor Oil (VENELEX) OINT Apply 1 application topically See admin instructions. Apply to sacrum, coccyx, and bilateral buttocks every shift and as needed for wound  . bisacodyl (DULCOLAX) 10 MG suppository Place 1 suppository (10 mg total) rectally daily as needed for moderate constipation.  . busPIRone (BUSPAR) 5 MG tablet Take 5 mg by mouth 3 (three) times daily.   Marland Kitchen diltiazem (CARDIZEM) 90 MG tablet Take 1 tablet (90 mg total) by mouth every 8 (eight) hours.  . docusate (COLACE) 50 MG/5ML liquid Take 10 mLs (100 mg total) by mouth 2 (two) times daily.  . Eyelid Cleansers (OCUSOFT EYELID CLEANSING) PADS Apply 1 application topically 2 (two) times daily. cleanse both eyes BID Twice A Day 09:00 AM, 09:00 PM   . levothyroxine (SYNTHROID, LEVOTHROID) 50 MCG tablet Take 50 mcg by mouth daily before breakfast.  . NON FORMULARY Diet Change: Dysphagia 1 (puree), thin liquids  . NON FORMULARY Magic cup daily- for increased nutrition intake Once A Day 02:00 PM  . polyethylene glycol (MIRALAX / GLYCOLAX) 17 g packet Take 17 g by mouth daily as needed for mild constipation.  . QUEtiapine Fumarate (SEROQUEL PO) Take 12.5 mg by mouth 2 (two) times daily.   Marland Kitchen senna (SENOKOT) 8.6 MG TABS tablet Take 2 tablets (17.2 mg total) by mouth daily.  . traMADol (ULTRAM-ER) 100 MG 24 hr tablet Take 100 mg by mouth every 8 (eight) hours. NOTE DOSE CHANGE  . [DISCONTINUED] Cholecalciferol 1.25 MG (50000 UT) capsule Take 50,000 Units by mouth. Once a day on Thursday  . [DISCONTINUED] traMADol (ULTRAM) 50 MG tablet Take 2 tablets (100 mg total) by mouth every 8 (eight) hours.  . [DISCONTINUED] zinc sulfate 220 (50 Zn) MG capsule Take 220 mg by mouth daily.   No facility-administered encounter medications  on file as of 03/12/2019.     SIGNIFICANT DIAGNOSTIC EXAMS   PREVIOUS    10-03-18: left hip and pelvis x-ray: Mildly comminuted, foreshortened and slightly valgus angulated transcervical left femoral neck fracture. Remote appearing posttraumatic deformity of the left inferior and superior pubic ramus  10-03-18: chest x-ray:  1. No active disease.  Likely chronic coarse interstitial changes. 2. Question projectional irregularity versus fracture of the left humerus. Correlate with point tenderness and consider dedicated shoulder radiographs if there is clinical concern  10-03-18:  left humerus x-ray: Age indeterminate fracture of the left humeral neck. Clinical correlation is recommended.  10-05-18: pelvic x-ray:  1. Interval left hip replacement with expected surgical change 2. Chronic deformities of the left superior and inferior pubic rami  02-11-19: chest x-ray: No evidence of acute cardiopulmonary disease   02-11-19: right hip x-ray: Displaced right femoral neck fracture.   02-19-28: chest x-ray:Changes of bibasilar pneumonia. Consider possible development of perihilar bronchopneumonia.  NO NEW EXAMS.       LABS REVIEWED PREVIOUS;   03-17-18: wbc 5.7; hgb 12.4; hct 38.8; mcv 108.1; plt 224; glucose 85; bun 13; creat 0.91; k+ 3.3; na++ 138; ca 8.7 protein 5.7 albumin 3.1 hgb 4.8 03-20-18: glucose 74; bun 14; creat 0.81; k+ 3.5; na++ 137; ca 9.2 04-19-28: tsh 93.852 05-18-18: tsh 31.786 06-29-18: vit B 12: 308; tsh 14.789 07-11-18: wbc 4.7; hgb 12.9; hct 39.1; mcv 104.5; plt 146 ; glucose 89; bun 24; creat 0.74; k+ 3.9; na++ 141; ca 9.0 liver normal albumin 3.2 tsh 13.945 free T4: 0.78 08-01-18: tsh 6.297 free T3; 2.2 free T4: 1.13 10-03-18: wbc 8.4; hgb 15.6; hct 48.0; mcv 99.2 plt 160; glucose 124; bun 30; creat 0.81; k+ 4.3; na++ 142; ca 9.6 10-06-18: wbc 9.9; hgb 15.3; hct 46.1; mcv 99.1; plt 121; glucose 134; bun 15; creat 0.66; k+ 3.8; na++ 140; ca 8.8 10-09-18: wbc 7.8; hgb 13.2;  hct 39.0; mcv 98.7 plt 135 01-26-19: wbc 5.8; hgb 13.6; hct  42.3; mcv 104.7 plt 145; iron 129 tibc 377; vit B 12: 392; folate 6.6; vit B 1: 90.0 02-11-19: wbc 9.2; hgb 14.7; hct 43.7;mcv 102.1 plt 144; glucose 122; bun 22; creat 0.64; k+ 4.3; na++ 139; ca 9.3 02-14-19: wbc 9.6; hgb 12.6; hct 36.9; mcv 102.2 plt 145; glucose 121; bun 21; creat 0.89; k+ 4.0; na++ 144;ca 8.9  02-19-19: wbc 9.8; hgb 13.5; hct 40.6; mcv 103.0 plt 227; glucose 97; bun 16; creat 0.76; k+ 3.3; na++ 145; ca 9.5   NO NEW LABS.    Review of Systems  Unable to perform ROS: Dementia (unable to participate )   Physical Exam Constitutional:      General: She is not in acute distress.    Appearance: She is cachectic. She is not diaphoretic.  Neck:     Thyroid: No thyromegaly.  Cardiovascular:     Rate and Rhythm: Normal rate and regular rhythm.     Pulses: Normal pulses.     Heart sounds: Normal heart sounds.     Comments: History of cardiac stent Pulmonary:     Effort: Pulmonary effort is normal. No respiratory distress.     Breath sounds: Normal breath sounds.  Abdominal:     General: Bowel sounds are normal. There is no distension.     Palpations: Abdomen is soft.     Tenderness: There is no abdominal tenderness.  Genitourinary:    Comments: foley Musculoskeletal:     Cervical back: Neck supple.     Right lower leg: No edema.     Left lower leg: No edema.     Comments:  Is able to move all extremities  Is status post right hip hemi-arthroplasty 02-12-19 Is status post left hip hemi arthroplasty 10-05-18 Is status post left humeral fracture History of bilateral knee replacements         Lymphadenopathy:     Cervical: No cervical adenopathy.  Skin:    General: Skin is warm and dry.  Neurological:     Mental Status:  She is alert. Mental status is at baseline.  Psychiatric:        Mood and Affect: Mood normal.     ASSESSMENT/ PLAN:  TODAY;   1. Seizure: no recent seizures will monitor   2.  Atrial fibrillation with RVR: heart rate is stable will continue cardizem 90 mg three times daily   3. Acquired hypothyroidism is stable tsh 6.297 (free t3 2.2; free t4: 1.13) will continue synthroid 50 mcg daily    PREVIOUS   4. Closed nondisplaced fracture of surgical neck of left humerus unspecified morphology/closed displaced fracture of left femoral neck closed displaced right femoral neck fracture:  is stable is on ultram 100 mg every 8 hours   5. Advanced dementia: is declining: weight is 84 pounds has had 2 hip fractures this past year; weight loss is an unfortunate outcome in the last stages of this disease process.   6. 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 Chronic anxiety: is stable will continue buspar 5 mg three times daily is off benzo  8. Unspecified chronic CHF (congestive heart failure) is compensated will monitor     MD is aware of resident's narcotic use and is in agreement with current plan of care. We will attempt to wean resident as appropriate.  Ok Edwards NP [facility]  Contact [phone] Monday through Friday 8am- 5pm  After hours call [phone]

## 2019-03-16 ENCOUNTER — Other Ambulatory Visit: Payer: Self-pay | Admitting: Adult Health

## 2019-03-16 MED ORDER — TRAMADOL HCL ER 100 MG PO TB24: 100.0000 mg | ORAL_TABLET | Freq: Three times a day (TID) | ORAL | 0 refills | Status: DC

## 2019-03-22 ENCOUNTER — Encounter: Payer: Self-pay | Admitting: Internal Medicine

## 2019-03-22 ENCOUNTER — Non-Acute Institutional Stay (SKILLED_NURSING_FACILITY): Payer: Medicare HMO | Admitting: Internal Medicine

## 2019-03-22 DIAGNOSIS — F0390 Unspecified dementia without behavioral disturbance: Secondary | ICD-10-CM

## 2019-03-22 DIAGNOSIS — F419 Anxiety disorder, unspecified: Secondary | ICD-10-CM

## 2019-03-22 DIAGNOSIS — I4891 Unspecified atrial fibrillation: Secondary | ICD-10-CM

## 2019-03-22 DIAGNOSIS — I509 Heart failure, unspecified: Secondary | ICD-10-CM

## 2019-03-22 DIAGNOSIS — S72001A Fracture of unspecified part of neck of right femur, initial encounter for closed fracture: Secondary | ICD-10-CM | POA: Diagnosis not present

## 2019-03-22 DIAGNOSIS — R569 Unspecified convulsions: Secondary | ICD-10-CM

## 2019-03-22 DIAGNOSIS — F03C0 Unspecified dementia, severe, without behavioral disturbance, psychotic disturbance, mood disturbance, and anxiety: Secondary | ICD-10-CM

## 2019-03-22 NOTE — Progress Notes (Signed)
Location:  [facility] Room Number: 134 Place of Service:  SNF [phone]) Provider: Wille Celeste, PA-C   Hennie Duos, MD  Patient Care Team: Hennie Duos, MD as PCP - General (Internal Medicine) Martinique, Peter M, MD as PCP - Cardiology (Cardiology) Nyoka Cowden Phylis Bougie, NP as Nurse Practitioner ([place], [place] ([place])  Extended Emergency Contact Information Primary Emergency Contact: KELLY,WENDY Address: [address], [place] [address] [place] [employer] [place] Phone: [phone] Relation: Daughter Secondary Emergency Contact: Quentin Ore Address: [address] [place] [address] [place] [employer] Phone: [phone] Relation: Daughter  Code Status:  DNR Goals of care: Advanced Directive information Advanced Directives 03/22/2019  Does Patient Have a Medical Advance Directive? Yes  Type of Advance Directive Out of facility DNR (pink MOST or yellow form)  Does patient want to make changes to medical advance directive? No - Patient declined  Would patient like information on creating a medical advance directive? -  Pre-existing out of facility DNR order (yellow form or pink MOST form) Yellow form placed in chart (order not valid for inpatient use);Pink MOST form placed in chart (order not valid for inpatient use)     Chief Complaint  Patient presents with  . Medical Management of Chronic Issues    Routine Visit   Medical management of chronic medical conditions including history of recent right hip fracture-atrial fibrillation-dementia-hypothyroidism-anxiety and CHF  HPI:  Pt is a 86 y.o. female seen today for medical management of chronic diseases.  As noted above.  She does have a history of a recent right hip fracture status post humeral arthroplasty February 12, 2019-she also had a left hip hemiarthroplasty back in August 2020 previous  history of a left humeral fracture and bilateral knee replacements.  Despite all this she appears to be comfortable and does not really complain of pain.  She does have orders for tramadol 100 mg 3 times daily  In regards to atrial fibrillation this appears rate controlled on Cardizem she is on aspirin for anticoagulation.  She does have a history of hypothyroidism she is on Synthroid 50 mcg a day last 3 4 was normal at 1.13 TSH was mildly elevated at 6.297.  Regards to dementia she does continue on Seroquel twice daily 12.5 m milligrams.  She also has a history of CHF this appears to be compensated currently I do not see any increased edema or concerning chest congestion or overt shortness of breath.  Currently she is lying in bed comfortably she is talking a bit which is her usual state she appears to be comfortable     Past Medical History:  Diagnosis Date  . Alcohol abuse 02/07/2018  . Atrial fibrillation ([place])   . Atrial fibrillation, permanent ([place]) 02/13/2019  . CHF (congestive heart failure) ([place]) 02/07/2018  . Closed left humeral fracture 10/14/2018  . Dementia ([place]) 02/07/2018  . Dysphagia   . GERD (gastroesophageal reflux disease)   . High cholesterol   . Hypertension   . Hypokalemia   . Left displaced femoral neck fracture ([place]) 10/03/2018  . Lung cancer ([place])   . Psychosis ([place]) 04/08/2018  . Seizures ([place]) 02/07/2018  . Thyroid disease   . TIA (transient ischemic attack)    Past Surgical History:  Procedure  Laterality Date  . cardiac implants and grafts    . HIP ARTHROPLASTY Right 02/12/2019   Procedure: ARTHROPLASTY BIPOLAR HIP (HEMIARTHROPLASTY);  Surgeon: Marybelle Killings, MD;  Location: [facility];  Service: Orthopedics;  Laterality: Right;  . LOBECTOMY    . REPLACEMENT TOTAL KNEE Bilateral   . TOTAL HIP ARTHROPLASTY Left 10/05/2018   Procedure: HEMI HIP ARTHROPLASTY ANTERIOR APPROACH;  Surgeon: Leandrew Koyanagi, MD;  Location: [facility];  Service: Orthopedics;   Laterality: Left;    Allergies  Allergen Reactions  . Levofloxacin     unknown    Outpatient Encounter Medications as of 03/22/2019  Medication Sig  . acetaminophen (TYLENOL) 325 MG tablet Take 650 mg by mouth 3 (three) times daily as needed.  . Amino Acids-Protein Hydrolys (FEEDING SUPPLEMENT, PRO-STAT SUGAR FREE 64,) LIQD Take 30 mLs by mouth 2 (two) times daily between meals.  Marland Kitchen aspirin 325 MG tablet Take 1 tablet (325 mg total) by mouth daily.  Roseanne Kaufman Peru-Castor Oil (VENELEX) OINT Apply 1 application topically See admin instructions. Apply to sacrum, coccyx, and bilateral buttocks every shift and as needed for wound  . bisacodyl (DULCOLAX) 10 MG suppository Place 1 suppository (10 mg total) rectally daily as needed for moderate constipation.  . busPIRone (BUSPAR) 5 MG tablet Take 5 mg by mouth 3 (three) times daily.   Marland Kitchen diltiazem (TIAZAC) 240 MG 24 hr capsule Take 240 mg by mouth daily.  Marland Kitchen docusate (COLACE) 50 MG/5ML liquid Take 10 mLs (100 mg total) by mouth 2 (two) times daily.  . Eyelid Cleansers (OCUSOFT EYELID CLEANSING) PADS Apply 1 application topically 2 (two) times daily. cleanse both eyes BID Twice A Day 09:00 AM, 09:00 PM   . levothyroxine (SYNTHROID, LEVOTHROID) 50 MCG tablet Take 50 mcg by mouth daily before breakfast.  . NON FORMULARY Diet Change: Dysphagia 1 (puree), thin liquids  . NON FORMULARY Magic cup daily- for increased nutrition intake Once A Day 02:00 PM  . polyethylene glycol (MIRALAX / GLYCOLAX) 17 g packet Take 17 g by mouth daily as needed for mild constipation.  . QUEtiapine Fumarate (SEROQUEL PO) Take 12.5 mg by mouth 2 (two) times daily.   Marland Kitchen senna (SENOKOT) 8.6 MG TABS tablet Take 2 tablets (17.2 mg total) by mouth daily.  . traMADol (ULTRAM-ER) 100 MG 24 hr tablet Take 1 tablet (100 mg total) by mouth every 8 (eight) hours. NOTE DOSE CHANGE  . [DISCONTINUED] diltiazem (CARDIZEM) 90 MG tablet Take 1 tablet (90 mg total) by mouth every 8 (eight)  hours.   No facility-administered encounter medications on file as of 03/22/2019.    Review of Systems   Essentially unobtainable secondary to dementia-nursing does not report any recent concerns  Immunization History  Administered Date(s) Administered  . Influenza-Unspecified 11/27/2018  . Moderna SARS-COVID-2 Vaccination 02/28/2019  . Pneumococcal Polysaccharide-23 08/10/2018  . Pneumococcal-Unspecified 11/14/2018  . Td 11/14/2018   Pertinent  Health Maintenance Due  Topic Date Due  . PNA vac Low Risk Adult (2 of 2 - PCV13) 11/14/2019  . INFLUENZA VACCINE  Completed   Fall Risk  01/12/2019  Falls in the past year? 1  Number falls in past yr: 1  Injury with Fall? 1  Risk for fall due to : History of fall(s);Impaired mobility  Follow up Falls evaluation completed   Functional Status Survey:    Vitals:   03/22/19 1347  BP: 128/73  Pulse: 72  Resp: 20  Temp: 98.1 F (36.7 C)  TempSrc: Oral  SpO2:  95%  Weight: 84 lb 3.2 oz (38.2 kg)  Height: 5\' 2"  (1.575 m)   Body mass index is 15.4 kg/m. Physical Exam  In general this is a frail elderly female in no distress lying comfortably in bed.  Her skin is warm and dry.  Eyes visual acuity appears grossly intact sclera and conjunctive are clear.  Chest is clear to auscultation cannot really appreciate labored breathing she does not really follow verbal commands.  Heart is irregular irregular rate and rhythm without murmur gallop or rub she does not have significant lower extremity edema.  Her abdomen is soft nontender with positive bowel sounds.  Musculoskeletal Limited exam since she is in bed but appears able to move all extremities x4 with lower extremity weakness-she appears to have some inward rotation of her right hip but per nursing this is stable since her surgery.  Neurologic she is alert could not really appreciate lateralizing findings.  Psych findings consistent with dementia she does not really follow  verbal commands but is not agitated with exam she does talk fairly constantly  Labs reviewed: Recent Labs    02/12/19 0332 02/13/19 0408 02/14/19 0956 02/19/19 0836 02/21/19 0615  NA 142   < > 144 145 141  K 3.9   < > 4.0 3.3* 4.5  CL 106   < > 110 107 108  CO2 25   < > 23 23 24   GLUCOSE 105*   < > 121* 97 85  BUN 18   < > 21 16 18   CREATININE 0.77   < > 0.89 0.76 0.54  CALCIUM 9.2   < > 8.9 9.5 8.8*  MG 2.2  --   --   --   --    < > = values in this interval not displayed.   Recent Labs    07/11/18 0600  AST 18  ALT 13  ALKPHOS 90  BILITOT 0.7  PROT 5.6*  ALBUMIN 3.2*   Recent Labs    10/09/18 0613 01/26/19 0700 02/11/19 0845 02/12/19 0332 02/13/19 0408 02/14/19 0956 02/19/19 0836  WBC 7.8   < > 9.2   < > 11.1* 9.6 9.8  NEUTROABS 5.7  --  6.6  --   --   --  7.4  HGB 13.2   < > 14.7   < > 13.3 12.6 13.5  HCT 39.0   < > 43.7   < > 39.2 36.9 40.6  MCV 98.7   < > 102.1*   < > 100.8* 102.2* 103.0*  PLT 135*   < > 144*   < > 128* 145* 227   < > = values in this interval not displayed.   Lab Results  Component Value Date   TSH 6.297 (H) 08/01/2018   Lab Results  Component Value Date   HGBA1C 4.8 03/17/2018   No results found for: CHOL, HDL, LDLCALC, LDLDIRECT, TRIG, CHOLHDL  Significant Diagnostic Results in last 30 days:  XR HIP UNILAT W OR W/O PELVIS 2-3 VIEWS RIGHT  Result Date: 02/27/2019 AP frog-leg lateral right hip obtained reviewed this shows well-positioned press-fit hemiarthroplasty. Impression: Satisfactory right hip hemiarthroplasty.   Assessment/Plan . #1 atrial fibrillation this appears rate controlled on Cardizem 240 mg a day she is on aspirin for anticoagulation.  2.  History of advanced dementia at this point continue supportive care she has lost some weight it appears I suspect this is expected secondary to advancing dementia -she does not appear to be in any distress  or discomfort at this point continue supportive care  #3-history of  right femoral neck fracture that was surgically repaired at this point appears to be stable she does have tramadol 100 mg 3 times daily for pain.  4.  Previous history of nondisplaced fracture of the surgical neck of the left humerus-as well as closed displaced fracture of left femoral neck-again she continues on the tramadol for pain and has been followed by orthopedics.  5.  History of acquired hypothyroidism she is on Synthroid 50 mcg a day free T4 was 1.13 TSH was mildly elevated at 6.297.  6.  History of seizure disorder appears to be stable she is not currently on medication for this.  7.  History of psychosis she is on Seroquel 12.5 mg twice daily this appears to be stable.  8.  History of anxiety she continues on BuSpar 5 mg 3 times daily at this point appears to be controlled.  9.  History of CHF this appears to be compensated she does not show evidence of any congestion shortness of breath or increased edema.  [id number]

## 2019-03-24 ENCOUNTER — Encounter: Payer: Self-pay | Admitting: Internal Medicine

## 2019-03-26 ENCOUNTER — Other Ambulatory Visit: Payer: Self-pay | Admitting: Adult Health

## 2019-03-26 MED ORDER — TRAMADOL HCL 50 MG PO TABS: 100.0000 mg | ORAL_TABLET | Freq: Three times a day (TID) | ORAL | 0 refills | Status: DC

## 2019-04-12 ENCOUNTER — Encounter (HOSPITAL_COMMUNITY): Payer: Self-pay

## 2019-04-12 ENCOUNTER — Observation Stay (HOSPITAL_COMMUNITY)
Admission: EM | Admit: 2019-04-12 | Discharge: 2019-04-13 | Disposition: A | Discharge status: Home / Self Care | Payer: Medicare HMO | Attending: Family Medicine | Admitting: Family Medicine

## 2019-04-12 DIAGNOSIS — Z8673 Personal history of transient ischemic attack (TIA), and cerebral infarction without residual deficits: Secondary | ICD-10-CM | POA: Diagnosis not present

## 2019-04-12 DIAGNOSIS — Z20822 Contact with and (suspected) exposure to covid-19: Secondary | ICD-10-CM | POA: Diagnosis not present

## 2019-04-12 DIAGNOSIS — F0390 Unspecified dementia without behavioral disturbance: Secondary | ICD-10-CM | POA: Diagnosis not present

## 2019-04-12 DIAGNOSIS — I509 Heart failure, unspecified: Secondary | ICD-10-CM | POA: Diagnosis not present

## 2019-04-12 DIAGNOSIS — Z79899 Other long term (current) drug therapy: Secondary | ICD-10-CM | POA: Diagnosis not present

## 2019-04-12 DIAGNOSIS — K922 Gastrointestinal hemorrhage, unspecified: Secondary | ICD-10-CM | POA: Diagnosis not present

## 2019-04-12 DIAGNOSIS — I110 Hypertensive heart disease with heart failure: Secondary | ICD-10-CM | POA: Insufficient documentation

## 2019-04-12 DIAGNOSIS — Z881 Allergy status to other antibiotic agents status: Secondary | ICD-10-CM | POA: Diagnosis not present

## 2019-04-12 DIAGNOSIS — E782 Mixed hyperlipidemia: Secondary | ICD-10-CM | POA: Diagnosis not present

## 2019-04-12 DIAGNOSIS — I4891 Unspecified atrial fibrillation: Secondary | ICD-10-CM | POA: Diagnosis not present

## 2019-04-12 DIAGNOSIS — K625 Hemorrhage of anus and rectum: Secondary | ICD-10-CM | POA: Diagnosis present

## 2019-04-12 DIAGNOSIS — F03C0 Unspecified dementia, severe, without behavioral disturbance, psychotic disturbance, mood disturbance, and anxiety: Secondary | ICD-10-CM | POA: Diagnosis present

## 2019-04-12 LAB — COMPREHENSIVE METABOLIC PANEL
ALT: 14 U/L (ref 0–44)
AST: 19 U/L (ref 15–41)
Albumin: 3.5 g/dL (ref 3.5–5.0)
Alkaline Phosphatase: 108 U/L (ref 38–126)
Anion gap: 11 (ref 5–15)
BUN: 30 mg/dL — ABNORMAL HIGH (ref 8–23)
CO2: 25 mmol/L (ref 22–32)
Calcium: 9.3 mg/dL (ref 8.9–10.3)
Chloride: 104 mmol/L (ref 98–111)
Creatinine, Ser: 0.58 mg/dL (ref 0.44–1.00)
GFR calc Af Amer: >60 mL/min (ref >60)
GFR calc non Af Amer: >60 mL/min (ref >60)
Glucose, Bld: 96 mg/dL (ref 70–99)
Potassium: 4.4 mmol/L (ref 3.5–5.1)
Sodium: 140 mmol/L (ref 135–145)
Total Bilirubin: 0.7 mg/dL (ref 0.3–1.2)
Total Protein: 6.3 g/dL — ABNORMAL LOW (ref 6.5–8.1)

## 2019-04-12 LAB — HEMOGLOBIN AND HEMATOCRIT, BLOOD
HCT: 36 % (ref 36.0–46.0)
HCT: 35 % — ABNORMAL LOW (ref 36.0–46.0)
Hemoglobin: 11.8 g/dL — ABNORMAL LOW (ref 12.0–15.0)
Hemoglobin: 11.6 g/dL — ABNORMAL LOW (ref 12.0–15.0)

## 2019-04-12 LAB — PROTIME-INR
INR: 1 (ref 0.8–1.2)
Prothrombin Time: 13.1 seconds (ref 11.4–15.2)

## 2019-04-12 LAB — CBC WITH DIFFERENTIAL/PLATELET
Abs Immature Granulocytes: 0.05 10*3/uL (ref 0.00–0.07)
Basophils Absolute: 0.1 10*3/uL (ref 0.0–0.1)
Basophils Relative: 1 %
Eosinophils Absolute: 0.2 10*3/uL (ref 0.0–0.5)
Eosinophils Relative: 2 %
HCT: 37 % (ref 36.0–46.0)
Hemoglobin: 12.2 g/dL (ref 12.0–15.0)
Immature Granulocytes: 1 %
Lymphocytes Relative: 22 %
Lymphs Abs: 1.7 10*3/uL (ref 0.7–4.0)
MCH: 35.2 pg — ABNORMAL HIGH (ref 26.0–34.0)
MCHC: 33 g/dL (ref 30.0–36.0)
MCV: 106.6 fL — ABNORMAL HIGH (ref 80.0–100.0)
Monocytes Absolute: 0.7 10*3/uL (ref 0.1–1.0)
Monocytes Relative: 9 %
Neutro Abs: 5.1 10*3/uL (ref 1.7–7.7)
Neutrophils Relative %: 65 %
Platelets: 198 10*3/uL (ref 150–400)
RBC: 3.47 MIL/uL — ABNORMAL LOW (ref 3.87–5.11)
RDW: 14.1 % (ref 11.5–15.5)
WBC: 7.7 10*3/uL (ref 4.0–10.5)
nRBC: 0 % (ref 0.0–0.2)

## 2019-04-12 LAB — SARS CORONAVIRUS 2 (TAT 6-24 HRS): SARS Coronavirus 2: NEGATIVE

## 2019-04-12 LAB — TYPE AND SCREEN
ABO/RH(D): O POS
Antibody Screen: NEGATIVE

## 2019-04-12 LAB — MRSA PCR SCREENING: MRSA by PCR: POSITIVE — AB

## 2019-04-12 LAB — APTT: aPTT: 28 seconds (ref 24–36)

## 2019-04-12 MED ORDER — ONDANSETRON HCL 4 MG PO TABS: 4.0000 mg | ORAL_TABLET | Freq: Four times a day (QID) | ORAL | Status: DC | PRN

## 2019-04-12 MED ORDER — ACETAMINOPHEN 650 MG RE SUPP: 650.0000 mg | Freq: Four times a day (QID) | RECTAL | Status: DC | PRN

## 2019-04-12 MED ORDER — POLYETHYLENE GLYCOL 3350 17 G PO PACK: 17.0000 g | PACK | Freq: Every day | ORAL | Status: DC | PRN

## 2019-04-12 MED ORDER — SODIUM CHLORIDE 0.9 % IV SOLN: 250.0000 mL | INTRAVENOUS | Status: DC | PRN

## 2019-04-12 MED ORDER — BUSPIRONE HCL 5 MG PO TABS
5.0000 mg | ORAL_TABLET | Freq: Three times a day (TID) | ORAL | Status: DC
  Administered 2019-04-13: 5 mg via ORAL
  Filled 2019-04-12: qty 1

## 2019-04-12 MED ORDER — CHLORHEXIDINE GLUCONATE CLOTH 2 % EX PADS
6.0000 | MEDICATED_PAD | Freq: Every day | CUTANEOUS | Status: DC
  Administered 2019-04-13: 6 via TOPICAL

## 2019-04-12 MED ORDER — PRO-STAT SUGAR FREE PO LIQD
30.0000 mL | Freq: Two times a day (BID) | ORAL | Status: DC
  Filled 2019-04-12: qty 30

## 2019-04-12 MED ORDER — ALBUTEROL SULFATE (2.5 MG/3ML) 0.083% IN NEBU: 2.5000 mg | INHALATION_SOLUTION | RESPIRATORY_TRACT | Status: DC | PRN

## 2019-04-12 MED ORDER — BISACODYL 10 MG RE SUPP: 10.0000 mg | Freq: Every day | RECTAL | Status: DC | PRN

## 2019-04-12 MED ORDER — LEVOTHYROXINE SODIUM 50 MCG PO TABS
50.0000 ug | ORAL_TABLET | Freq: Every day | ORAL | Status: DC
  Administered 2019-04-13: 50 ug via ORAL
  Filled 2019-04-12: qty 1

## 2019-04-12 MED ORDER — SODIUM CHLORIDE 0.9 % IV SOLN: INTRAVENOUS | Status: DC

## 2019-04-12 MED ORDER — DOCUSATE SODIUM 100 MG PO CAPS
100.0000 mg | ORAL_CAPSULE | Freq: Two times a day (BID) | ORAL | Status: DC
  Administered 2019-04-13: 100 mg via ORAL
  Filled 2019-04-12: qty 1

## 2019-04-12 MED ORDER — ACETAMINOPHEN 325 MG PO TABS: 650.0000 mg | ORAL_TABLET | Freq: Four times a day (QID) | ORAL | Status: DC | PRN

## 2019-04-12 MED ORDER — DILTIAZEM HCL ER BEADS 240 MG PO CP24
240.0000 mg | ORAL_CAPSULE | Freq: Every day | ORAL | Status: DC
  Administered 2019-04-13: 240 mg via ORAL
  Filled 2019-04-12 (×7): qty 1

## 2019-04-12 MED ORDER — ONDANSETRON HCL 4 MG/2ML IJ SOLN: 4.0000 mg | Freq: Four times a day (QID) | INTRAMUSCULAR | Status: DC | PRN

## 2019-04-12 MED ORDER — SENNA 8.6 MG PO TABS
2.0000 | ORAL_TABLET | Freq: Every day | ORAL | Status: DC
  Administered 2019-04-13: 17.2 mg via ORAL
  Filled 2019-04-12: qty 2

## 2019-04-12 MED ORDER — TRAMADOL HCL 50 MG PO TABS
100.0000 mg | ORAL_TABLET | Freq: Three times a day (TID) | ORAL | Status: DC
  Administered 2019-04-12: 100 mg via ORAL
  Filled 2019-04-12 (×2): qty 2

## 2019-04-12 MED ORDER — SODIUM CHLORIDE 0.9% FLUSH: 3.0000 mL | INTRAVENOUS | Status: DC | PRN

## 2019-04-12 MED ORDER — TRAZODONE HCL 50 MG PO TABS
50.0000 mg | ORAL_TABLET | Freq: Every evening | ORAL | Status: DC | PRN
  Administered 2019-04-12: 50 mg via ORAL
  Filled 2019-04-12: qty 1

## 2019-04-12 MED ORDER — SODIUM CHLORIDE 0.9% FLUSH
3.0000 mL | Freq: Two times a day (BID) | INTRAVENOUS | Status: DC
  Administered 2019-04-12: 3 mL via INTRAVENOUS

## 2019-04-12 MED ORDER — MUPIROCIN 2 % EX OINT
1.0000 "application " | TOPICAL_OINTMENT | Freq: Two times a day (BID) | CUTANEOUS | Status: DC
  Administered 2019-04-12 – 2019-04-13 (×2): 1 via NASAL
  Filled 2019-04-12: qty 22

## 2019-04-12 MED ORDER — PANTOPRAZOLE SODIUM 40 MG IV SOLR
40.0000 mg | INTRAVENOUS | Status: DC
  Filled 2019-04-12 (×2): qty 40

## 2019-04-12 MED ORDER — DILTIAZEM HCL 25 MG/5ML IV SOLN
10.0000 mg | Freq: Once | INTRAVENOUS | Status: AC
  Administered 2019-04-12: 10 mg via INTRAVENOUS
  Filled 2019-04-12: qty 5

## 2019-04-12 MED ORDER — QUETIAPINE FUMARATE 25 MG PO TABS
12.5000 mg | ORAL_TABLET | Freq: Two times a day (BID) | ORAL | Status: DC
  Administered 2019-04-12 – 2019-04-13 (×2): 12.5 mg via ORAL
  Filled 2019-04-12 (×2): qty 1

## 2019-04-12 NOTE — ED Notes (Signed)
No hearing aide or glasses noted. Patient in gown, non skid socks and sheet with wheelchair as possessions.

## 2019-04-12 NOTE — ED Provider Notes (Signed)
[facility] EMERGENCY DEPARTMENT Provider Note   CSN: [id number] Arrival date & time: 04/12/19  4481   Level 5 caveat: Dementia, unable to answer any questions  History Chief Complaint  Patient presents with  . Rectal Bleeding    Casey Wood is a 86 y.o. female.  HPI   Patient presents to the ED for evaluation of rectal bleeding.  Patient has a history of dementia and is a resident of the [facility].  According to EMS report the patient had an episode of rectal bleeding this morning.  She has continued to have blood per rectum since that time.  No history of hematemesis.  Patient has significant dementia and is unable to provide any history.  Past Medical History:  Diagnosis Date  . Alcohol abuse 02/07/2018  . Atrial fibrillation ([place])   . Atrial fibrillation, permanent ([place]) 02/13/2019  . CHF (congestive heart failure) ([place]) 02/07/2018  . Closed left humeral fracture 10/14/2018  . Dementia ([place]) 02/07/2018  . Dysphagia   . GERD (gastroesophageal reflux disease)   . High cholesterol   . Hypertension   . Hypokalemia   . Left displaced femoral neck fracture ([place]) 10/03/2018  . Lung cancer ([place])   . Psychosis ([place]) 04/08/2018  . Seizures ([place]) 02/07/2018  . Thyroid disease   . TIA (transient ischemic attack)     Patient Active Problem List   Diagnosis Date Noted  . GI bleed 04/12/2019  . Subcapital fracture of femur, right, closed, with routine healing, subsequent encounter 02/24/2019  . Constipation 02/24/2019  . Closed displaced fracture of right femoral neck ([place]) 02/22/2019  . HCAP (healthcare-associated pneumonia) 02/22/2019  . Cellulitis of right hip 02/22/2019  . Medicare annual wellness visit, subsequent 01/23/2019  . Goals of care, counseling/discussion   . Palliative care by specialist   . Chronic anxiety 05/11/2018  . Acquired hypothyroidism 04/25/2018  . 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 Atrial  fibrillation with RVR ([place]) 02/07/2018  . Advanced dementia ([place]) 02/07/2018  . Seizures ([place]) 02/07/2018  . Alcohol abuse 02/07/2018  . CHF (congestive heart failure) ([place]) 02/07/2018    Past Surgical History:  Procedure Laterality Date  . cardiac implants and grafts    . HIP ARTHROPLASTY Right 02/12/2019   Procedure: ARTHROPLASTY BIPOLAR HIP (HEMIARTHROPLASTY);  Surgeon: Marybelle Killings, MD;  Location: [facility];  Service: Orthopedics;  Laterality: Right;  . LOBECTOMY    . REPLACEMENT TOTAL KNEE Bilateral   . TOTAL HIP ARTHROPLASTY Left 10/05/2018   Procedure: HEMI HIP ARTHROPLASTY ANTERIOR APPROACH;  Surgeon: Leandrew Koyanagi, MD;  Location: [facility];  Service: Orthopedics;  Laterality: Left;     OB History   No obstetric history on file.     Family History  Problem Relation Age of Onset  . Heart attack Father        13s    Social History   Tobacco Use  . Smoking status: Never Smoker  . Smokeless tobacco: Never Used  Substance Use Topics  . Alcohol use: Not Currently  . Drug use: No    Home Medications Prior to Admission medications   Medication Sig Start Date End Date Taking? Authorizing Provider  acetaminophen (TYLENOL) 325 MG tablet Take 650 mg by mouth 3 (three) times daily as needed.    [provider]  Amino Acids-Protein Hydrolys (FEEDING SUPPLEMENT, PRO-STAT SUGAR FREE 64,) LIQD Take 30 mLs by mouth 2 (two) times daily between meals.    [provider]  aspirin 325 MG tablet Take 1 tablet (325 mg total) by mouth daily. 02/15/19   Debbe Odea, MD  Roseanne Kaufman Peru-Castor Oil [facility]) OINT Apply 1 application topically See admin instructions. Apply to sacrum, coccyx, and bilateral buttocks every shift and as needed for wound    [provider]  bisacodyl (DULCOLAX) 10 MG suppository Place 1 suppository (10 mg total) rectally daily as needed for moderate constipation. 02/14/19   Debbe Odea, MD  busPIRone (BUSPAR) 5 MG tablet Take 5 mg by mouth 3  (three) times daily.     [provider]  diltiazem (TIAZAC) 240 MG 24 hr capsule Take 240 mg by mouth daily.    [provider]  docusate (COLACE) 50 MG/5ML liquid Take 10 mLs (100 mg total) by mouth 2 (two) times daily. 02/14/19   Debbe Odea, MD  Eyelid Cleansers (OCUSOFT EYELID CLEANSING) PADS Apply 1 application topically 2 (two) times daily. cleanse both eyes BID Twice A Day 09:00 AM, 09:00 PM     [provider]  levothyroxine (SYNTHROID, LEVOTHROID) 50 MCG tablet Take 50 mcg by mouth daily before breakfast.    [provider]  NON FORMULARY Diet Change: Dysphagia 1 (puree), thin liquids 10/10/18   [provider]  NON FORMULARY Magic cup daily- for increased nutrition intake Once A Day 02:00 PM    [provider]  polyethylene glycol (MIRALAX / GLYCOLAX) 17 g packet Take 17 g by mouth daily as needed for mild constipation. 10/09/18   Shelly Coss, MD  QUEtiapine Fumarate (SEROQUEL PO) Take 12.5 mg by mouth 2 (two) times daily.  07/31/18   [provider]  senna (SENOKOT) 8.6 MG TABS tablet Take 2 tablets (17.2 mg total) by mouth daily. 02/15/19   Debbe Odea, MD  traMADol (ULTRAM) 50 MG tablet Take 2 tablets (100 mg total) by mouth every 8 (eight) hours. 03/26/19   Gerlene Fee, NP    Allergies    Levofloxacin  Review of Systems   Review of Systems  Unable to perform ROS: Dementia    Physical Exam Updated Vital Signs BP 104/82   Pulse (!) 102   Temp 97.8 F (36.6 C) (Axillary)   Resp 15   Wt 38.2 kg   SpO2 100%   BMI 15.40 kg/m   Physical Exam Vitals and nursing note reviewed.  Constitutional:      General: She is not in acute distress.    Appearance: She is well-developed.     Comments: Elderly, frail  HENT:     Head: Normocephalic and atraumatic.     Right Ear: External ear normal.     Left Ear: External ear normal.  Eyes:     General: No scleral icterus.       Right eye: No discharge.         Left eye: No discharge.     Conjunctiva/sclera: Conjunctivae normal.  Neck:     Trachea: No tracheal deviation.  Cardiovascular:     Rate and Rhythm: Normal rate and regular rhythm.  Pulmonary:     Effort: Pulmonary effort is normal. No respiratory distress.     Breath sounds: Normal breath sounds. No stridor. No wheezing or rales.  Abdominal:     General: Bowel sounds are normal. There is no distension.     Palpations: Abdomen is soft.     Tenderness: There is no abdominal tenderness. There is no guarding or rebound.  Genitourinary:    Comments: Dark blood noted  on rectal exam, no melena no clots, no mass Musculoskeletal:        General: No tenderness.     Cervical back: Neck supple.  Skin:    General: Skin is warm and dry.     Findings: No rash.  Neurological:     Mental Status: She is alert.     Cranial Nerves: No cranial nerve deficit (no facial droop, extraocular movements intact, ) or dysarthria.     Motor: Abnormal muscle tone present. No seizure activity.     Comments: Patient is awake and looks at me, speech is nonsensical but occasionally patient says words that are understandable.  Upper extremity movement noted     ED Results / Procedures / Treatments   Labs (all labs ordered are listed, but only abnormal results are displayed) Labs Reviewed  COMPREHENSIVE METABOLIC PANEL - Abnormal; Notable for the following components:      Result Value   BUN 30 (*)    Total Protein 6.3 (*)    All other components within normal limits  CBC WITH DIFFERENTIAL/PLATELET - Abnormal; Notable for the following components:   RBC 3.47 (*)    MCV 106.6 (*)    MCH 35.2 (*)    All other components within normal limits  APTT  PROTIME-INR  TYPE AND SCREEN    EKG None  Radiology No results found.  Procedures Procedures (including critical care time)  Medications Ordered in ED Medications  0.9 %  sodium chloride infusion ( Intravenous New Bag/Given 04/12/19 8592)    ED  Course  I have reviewed the triage vital signs and the nursing notes.  Pertinent labs & imaging results that were available during my care of the patient were reviewed by me and considered in my medical decision making (see chart for details).  Clinical Course as of Apr 11 945  Thu Apr 12, 2019  0841 Labs reviewed.  No sig abnormalities   [JK]  0856 HR 109 at bedside.  BP otherwise stable.    [JK]  [id number] DIscussed with Dr Sydell Axon.  Will consult on patient.    [JK]  9244 Case discussed with daughter Clemens Catholic.  Agrees with admission, blood transfusion if needed.  Would discuss regarding further treatment options but overall not aggressive interventions, confirms DNR   [JK]    Clinical Course User Index [JK] Dorie Rank, MD   MDM Rules/Calculators/A&P                      Patient presents to the ED with acute GI bleeding.  Patient noted to have significant blood in her stool.  No obvious source of her bleeding.  Patient is currently not anemic.  She is hemodynamically stable but does have some mild tachycardia.  Not currently on anticoagulants other than aspirin.  Patient is certainly at risk for diverticular bleed.  Considering her age and comorbidities I will consult with GI and the medical service for admission and observation.  Final Clinical Impression(s) / ED Diagnoses Final diagnoses:  Lower GI bleed     Dorie Rank, MD 04/12/19 [phone]

## 2019-04-12 NOTE — H&P (Signed)
Patient Demographics:    Casey Wood, is a 86 y.o. female  MRN: [id number]   DOB - February 14, 1923  Admit Date - 04/12/2019  Outpatient Primary MD for the patient is Hennie Duos, MD   Assessment & Plan:    Principal Problem:   GI bleed Active Problems:   Atrial fibrillation with RVR ([place])   Advanced dementia ([place])    1) rectal bleeding--- recent lower GI bleed---cannot rule out diverticular bleed versus hemorrhoidal bleed--- family does not desire aggressive management, --Monitor H&H and transfuse as clinically indicated, -Endoluminal evaluation only as a last resort per family request -Continue Protonix -Stop aspirin  2) advanced dementia with significant cognitive and memory deficits deficits--with occasional behavioral disturbance continue BuSpar and Seroquel  3)PAFib--stable, continue Cardizem CD 240 mg daily, not a candidate for anticoagulation due to recurrent falls with hip fracture  4)ip fracture with arthroplasty (left 10/05/2018 and right 01/2019) --- due to mechanical falls --- stable hold aspirin  5) hypothyroidism--- continue levothyroxine  With History of - Reviewed by me  Past Medical History:  Diagnosis Date  . Alcohol abuse 02/07/2018  . Atrial fibrillation ([place])   . Atrial fibrillation, permanent ([place]) 02/13/2019  . CHF (congestive heart failure) ([place]) 02/07/2018  . Closed left humeral fracture 10/14/2018  . Dementia ([place]) 02/07/2018  . Dysphagia   . GERD (gastroesophageal reflux disease)   . High cholesterol   . Hypertension   . Hypokalemia   . Left displaced femoral neck fracture ([place]) 10/03/2018  . Lung cancer ([place])   . Psychosis ([place]) 04/08/2018  . Seizures ([place]) 02/07/2018  . Thyroid disease   . TIA (transient ischemic attack)       Past Surgical History:    Procedure Laterality Date  . cardiac implants and grafts    . HIP ARTHROPLASTY Right 02/12/2019   Procedure: ARTHROPLASTY BIPOLAR HIP (HEMIARTHROPLASTY);  Surgeon: Marybelle Killings, MD;  Location: [facility];  Service: Orthopedics;  Laterality: Right;  . LOBECTOMY    . REPLACEMENT TOTAL KNEE Bilateral   . TOTAL HIP ARTHROPLASTY Left 10/05/2018   Procedure: HEMI HIP ARTHROPLASTY ANTERIOR APPROACH;  Surgeon: Leandrew Koyanagi, MD;  Location: [facility];  Service: Orthopedics;  Laterality: Left;      Chief Complaint  Patient presents with  . Rectal Bleeding      HPI:    Casey Wood  is a 86 y.o. female  with h/o advanced dementia, Afib anticoagulated with ASA, hip fracture with arthroplasty (left 09/2018 and right 01/2019) presenting from SNF with rectal bleeding. Per ED provider: family agreeable to admission and transfusion but prefer avoiding aggressive interventions.   --History is limited due to advanced dementia with severe cognitive and memory deficits  --Currently her Hgb is 12.2, INR 1.0.  -Creatinine 0.58, sodium is 142, potassium is 4.1, LFTs are not elevated   Review of systems:    In addition to the HPI above,   A full Review of  Systems was done, all other systems reviewed are negative except as noted above in HPI , .    Social History:  Reviewed by me    Social History   Tobacco Use  . Smoking status: Never Smoker  . Smokeless tobacco: Never Used  Substance Use Topics  . Alcohol use: Not Currently       Family History :  Reviewed by me    Family History  Problem Relation Age of Onset  . Heart attack Father        52s     Home Medications:   Prior to Admission medications   Medication Sig Start Date End Date Taking? Authorizing Provider  acetaminophen (TYLENOL) 325 MG tablet Take 650 mg by mouth 3 (three) times daily as needed.    [provider]  Amino Acids-Protein Hydrolys (FEEDING SUPPLEMENT, PRO-STAT SUGAR FREE 64,) LIQD Take 30 mLs by mouth  2 (two) times daily between meals.    [provider]  aspirin 325 MG tablet Take 1 tablet (325 mg total) by mouth daily. 02/15/19   Debbe Odea, MD  Roseanne Kaufman [employer] [facility]) OINT Apply 1 application topically See admin instructions. Apply to sacrum, coccyx, and bilateral buttocks every shift and as needed for wound    [provider]  bisacodyl (DULCOLAX) 10 MG suppository Place 1 suppository (10 mg total) rectally daily as needed for moderate constipation. 02/14/19   Debbe Odea, MD  busPIRone (BUSPAR) 5 MG tablet Take 5 mg by mouth 3 (three) times daily.     [provider]  diltiazem (TIAZAC) 240 MG 24 hr capsule Take 240 mg by mouth daily.    [provider]  docusate (COLACE) 50 MG/5ML liquid Take 10 mLs (100 mg total) by mouth 2 (two) times daily. 02/14/19   Debbe Odea, MD  Eyelid Cleansers (OCUSOFT EYELID CLEANSING) PADS Apply 1 application topically 2 (two) times daily. cleanse both eyes BID Twice A Day 09:00 AM, 09:00 PM     [provider]  levothyroxine (SYNTHROID, LEVOTHROID) 50 MCG tablet Take 50 mcg by mouth daily before breakfast.    [provider]  NON FORMULARY Diet Change: Dysphagia 1 (puree), thin liquids 10/10/18   [provider]  NON FORMULARY Magic cup daily- for increased nutrition intake Once A Day 02:00 PM    [provider]  polyethylene glycol (MIRALAX / GLYCOLAX) 17 g packet Take 17 g by mouth daily as needed for mild constipation. 10/09/18   Shelly Coss, MD  QUEtiapine Fumarate (SEROQUEL PO) Take 12.5 mg by mouth 2 (two) times daily.  07/31/18   [provider]  senna (SENOKOT) 8.6 MG TABS tablet Take 2 tablets (17.2 mg total) by mouth daily. 02/15/19   Debbe Odea, MD  traMADol (ULTRAM) 50 MG tablet Take 2 tablets (100 mg total) by mouth every 8 (eight) hours. 03/26/19   Gerlene Fee, NP     Allergies:     Allergies  Allergen Reactions  . Levofloxacin      unknown     Physical Exam:   Vitals  Blood pressure 113/85, pulse 92, temperature 97.8 F (36.6 C), temperature source Axillary, resp. rate 16, weight 38.2 kg, SpO2 98 %.  Physical Examination: General appearance - alert, and in no acute distress  mental status -disoriented with significant cognitive and memory deficits Eyes - sclera anicteric Neck - supple, no JVD elevation , Chest - clear  to auscultation bilaterally, symmetrical air movement,  Heart - S1 and  S2 normal, regular  Abdomen - soft, nontender, nondistended, no masses or organomegaly Neurological -no new focal deficits, Extremities - no pedal edema noted, intact peripheral pulses  Skin - warm, dry     Data Review:    CBC Recent Labs  Lab 04/12/19 0800 04/12/19 1415 04/12/19 1709  WBC 7.7  --   --   HGB 12.2 11.8* 11.6*  HCT 37.0 36.0 35.0*  PLT 198  --   --   MCV 106.6*  --   --   MCH 35.2*  --   --   MCHC 33.0  --   --   RDW 14.1  --   --   LYMPHSABS 1.7  --   --   MONOABS 0.7  --   --   EOSABS 0.2  --   --   BASOSABS 0.1  --   --    ------------------------------------------------------------------------------------------------------------------  Chemistries  Recent Labs  Lab 04/12/19 0800  NA 140  K 4.4  CL 104  CO2 25  GLUCOSE 96  BUN 30*  CREATININE 0.58  CALCIUM 9.3  AST 19  ALT 14  ALKPHOS 108  BILITOT 0.7   ------------------------------------------------------------------------------------------------------------------ estimated creatinine clearance is 25.9 mL/min (by C-G formula based on SCr of 0.58 mg/dL). ------------------------------------------------------------------------------------------------------------------ No results for input(s): TSH, T4TOTAL, T3FREE, THYROIDAB in the last 72 hours.  Invalid input(s): FREET3   Coagulation profile Recent Labs  Lab 04/12/19 0800  INR 1.0    ------------------------------------------------------------------------------------------------------------------- No results for input(s): DDIMER in the last 72 hours. -------------------------------------------------------------------------------------------------------------------  Cardiac Enzymes No results for input(s): CKMB, TROPONINI, MYOGLOBIN in the last 168 hours.  Invalid input(s): CK ------------------------------------------------------------------------------------------------------------------ No results found for: BNP   ---------------------------------------------------------------------------------------------------------------  Urinalysis    Component Value Date/Time   COLORURINE AMBER (A) 02/12/2019 1559   APPEARANCEUR CLOUDY (A) 02/12/2019 1559   LABSPEC 1.018 02/12/2019 1559   PHURINE 6.0 02/12/2019 1559   GLUCOSEU NEGATIVE 02/12/2019 1559   HGBUR SMALL (A) 02/12/2019 1559   BILIRUBINUR NEGATIVE 02/12/2019 1559   KETONESUR NEGATIVE 02/12/2019 1559   PROTEINUR 30 (A) 02/12/2019 1559   NITRITE NEGATIVE 02/12/2019 1559   LEUKOCYTESUR MODERATE (A) 02/12/2019 1559    ----------------------------------------------------------------------------------------------------------------   Imaging Results:    No results found.  Radiological Exams on Admission: No results found.  DVT Prophylaxis -SCD   AM Labs Ordered, also please review Full Orders  Family Communication: Admission, patients condition and plan of care including tests being ordered have been discussed with the family/daughter who indicate understanding and agree with the plan   Code Status -DNR Likely DC to SNF facility when hemodynamically stable if H&H does not drop significantly  Condition   stable Roxan Hockey M.D on 04/12/2019 at 7:37 PM Go to [email or web address] -  for contact info  [facility] - Office  [phone]

## 2019-04-12 NOTE — ED Triage Notes (Signed)
Pt sent over from [facility]. Had an episode of rectal bleeding this morning. Pt has frank red rectal bleeding. NAD.

## 2019-04-12 NOTE — Progress Notes (Signed)
Brief chart review:   86 y/o female with h/o advanced dementia, Afib anticoagulated with ASA, hip fracture with arthroplasty (left 09/2018 and right 01/2019) presenting from SNF with rectal bleeding. Per ED provider: family agreeable to admission and transfusion but prefer avoiding aggressive interventions. Currently her Hgb is 12.2, INR 1.0.   Unknown when last colonoscopy was. No significant imaging available. Her VSS.  Suspect possible diverticular bleeding. Doubt rapid transit UGI bleed given hemodynamic stability. Cannot exclude hemorrhoidal bleeding or from malignancy. Family desires avoiding aggressive interventions. Spoke to patient's nurse, Meagan, patient has had two small amounts of dark blood per rectum since in the ED.   Plan: 1. Will follow with you, and fully assess tomorrow. If patient with significant bleeding or drop in H/H, will assess earlier.  2. Follow H/H.  3. Consider PPI.  4. Supportive measures.  5. I have communicated plan with Dr. Darden Palmer.   Laureen Ochs. Bernarda Caffey [facility] [phone] 2/18/202112:17 PM

## 2019-04-12 NOTE — ED Notes (Signed)
Report called to 300 hall for room 312, Informed nurse patients wheelchair is also here and I would bring it up.

## 2019-04-13 ENCOUNTER — Other Ambulatory Visit: Payer: Self-pay | Admitting: Adult Health

## 2019-04-13 ENCOUNTER — Inpatient Hospital Stay
Admission: RE | Admit: 2019-04-13 | Discharge: 2019-05-24 | Disposition: E | Payer: Medicare HMO | Source: Ambulatory Visit | Attending: Internal Medicine | Admitting: Internal Medicine

## 2019-04-13 ENCOUNTER — Other Ambulatory Visit: Payer: Self-pay

## 2019-04-13 DIAGNOSIS — F0390 Unspecified dementia without behavioral disturbance: Secondary | ICD-10-CM | POA: Diagnosis not present

## 2019-04-13 DIAGNOSIS — K922 Gastrointestinal hemorrhage, unspecified: Principal | ICD-10-CM

## 2019-04-13 DIAGNOSIS — I4891 Unspecified atrial fibrillation: Secondary | ICD-10-CM | POA: Diagnosis not present

## 2019-04-13 LAB — BASIC METABOLIC PANEL
Anion gap: 12 (ref 5–15)
BUN: 29 mg/dL — ABNORMAL HIGH (ref 8–23)
CO2: 21 mmol/L — ABNORMAL LOW (ref 22–32)
Calcium: 8.7 mg/dL — ABNORMAL LOW (ref 8.9–10.3)
Chloride: 108 mmol/L (ref 98–111)
Creatinine, Ser: 0.58 mg/dL (ref 0.44–1.00)
GFR calc Af Amer: >60 mL/min (ref >60)
GFR calc non Af Amer: >60 mL/min (ref >60)
Glucose, Bld: 79 mg/dL (ref 70–99)
Potassium: 3.8 mmol/L (ref 3.5–5.1)
Sodium: 141 mmol/L (ref 135–145)

## 2019-04-13 LAB — CBC
HCT: 34.1 % — ABNORMAL LOW (ref 36.0–46.0)
Hemoglobin: 10.9 g/dL — ABNORMAL LOW (ref 12.0–15.0)
MCH: 34.2 pg — ABNORMAL HIGH (ref 26.0–34.0)
MCHC: 32 g/dL (ref 30.0–36.0)
MCV: 106.9 fL — ABNORMAL HIGH (ref 80.0–100.0)
Platelets: 168 10*3/uL (ref 150–400)
RBC: 3.19 MIL/uL — ABNORMAL LOW (ref 3.87–5.11)
RDW: 14 % (ref 11.5–15.5)
WBC: 5.8 10*3/uL (ref 4.0–10.5)
nRBC: 0 % (ref 0.0–0.2)

## 2019-04-13 MED ORDER — MORPHINE SULFATE (CONCENTRATE) 20 MG/ML PO SOLN: 10.0000 mg | ORAL | 0 refills | Status: DC | PRN

## 2019-04-13 MED ORDER — POLYVINYL ALCOHOL 1.4 % OP SOLN: 1.0000 [drp] | OPHTHALMIC | 0 refills | Status: DC | PRN

## 2019-04-13 MED ORDER — ACETAMINOPHEN 325 MG PO TABS: 650.0000 mg | ORAL_TABLET | Freq: Four times a day (QID) | ORAL | 0 refills | Status: DC | PRN

## 2019-04-13 MED ORDER — OLANZAPINE 5 MG PO TBDP: 5.0000 mg | ORAL_TABLET | Freq: Every day | ORAL | 0 refills | Status: AC

## 2019-04-13 MED ORDER — ONDANSETRON 4 MG PO TBDP: 4.0000 mg | ORAL_TABLET | Freq: Three times a day (TID) | ORAL | 0 refills | Status: DC | PRN

## 2019-04-13 MED ORDER — BOOST / RESOURCE BREEZE PO LIQD CUSTOM: 1.0000 | Freq: Two times a day (BID) | ORAL | Status: DC

## 2019-04-13 MED ORDER — MORPHINE SULFATE (CONCENTRATE) 20 MG/ML PO SOLN: 5.0000 mg | ORAL | 0 refills | Status: AC | PRN

## 2019-04-13 MED ORDER — LORAZEPAM 2 MG/ML PO CONC: 0.5000 mg | Freq: Three times a day (TID) | ORAL | 0 refills | Status: DC | PRN

## 2019-04-13 MED ORDER — LORAZEPAM 2 MG/ML PO CONC: 1.0000 mg | Freq: Three times a day (TID) | ORAL | 0 refills | Status: DC | PRN

## 2019-04-13 MED ORDER — DOCUSATE SODIUM 50 MG/5ML PO LIQD: 200.0000 mg | Freq: Two times a day (BID) | ORAL | 0 refills | Status: DC

## 2019-04-13 NOTE — Discharge Instructions (Signed)
1)Discharge back to SNF with focus on comfort care 2)Please see care instructions as outlined in MOST form---- 3)Please notify POA/patient's daughter if any change in condition, please do Not transport to the hospital unless asked by patient's daughter to do so 4)Please Use morphine sulfate, lorazepam and Zyprexa as needed for discomfort restlessness agitation anxiety or sleep 5)Please Avoid blood draws or imaging studies unless specifically approved by patient's daughter/POA

## 2019-04-13 NOTE — TOC Transition Note (Signed)
Transition of Care [facility]) - CM/SW Discharge Note   Patient Details  Name: Casey Wood MRN: [id number] Date of Birth: Oct 28, 1922  [facility]) CM/SW Contact:  Boneta Lucks, RN Phone Number: 04/12/2019, 2:23 PM   Clinical Narrative:   Patient transition to full comfort care.  Daughter [name] is on her way to visit and sign MOST form.  [name] with [facility] updated and approved patient to come back later today after visit with daughter. MD and RN updated.     Final next level of care: [place] Barriers to Discharge: Barriers Resolved   Patient Goals and CMS Choice     Discharge Placement              Patient chooses bed at: [facility] Patient to be transferred to facility by: [facility] Staff   Patient and family notified of of transfer: 04/06/2019  Discharge Plan and [place] - comfort care

## 2019-04-13 NOTE — Discharge Summary (Signed)
Casey Wood, is a 86 y.o. female  DOB [birth year]  MRN [id number].  Admission date:  04/12/2019  Admitting Physician  Roxan Hockey, MD  Discharge Date:  03/30/2019   Primary MD  Hennie Duos, MD  Recommendations for primary care physician for things to follow:   1)Discharge back to SNF with focus on comfort care 2)Please see care instructions as outlined in MOST form---- 3)Please notify POA/patient's daughter if any change in condition, please do Not transport to the hospital unless asked by patient's daughter to do so 4)Please Use morphine sulfate, lorazepam and Zyprexa as needed for discomfort restlessness agitation anxiety or sleep 5)Please Avoid blood draws or imaging studies unless specifically approved by patient's daughter/POA  Admission Diagnosis  GI bleed [K92.2] Lower GI bleed [K92.2]   Discharge Diagnosis  GI bleed [K92.2] Lower GI bleed [K92.2]   Principal Problem:   GI bleed Active Problems:   Atrial fibrillation with RVR ([place])   Advanced dementia ([place])   Lower GI bleed      Past Medical History:  Diagnosis Date  . Alcohol abuse 02/07/2018  . Atrial fibrillation ([place])   . Atrial fibrillation, permanent ([place]) 02/13/2019  . CHF (congestive heart failure) ([place]) 02/07/2018  . Closed left humeral fracture 10/14/2018  . Dementia ([place]) 02/07/2018  . Dysphagia   . GERD (gastroesophageal reflux disease)   . High cholesterol   . Hypertension   . Hypokalemia   . Left displaced femoral neck fracture ([place]) 10/03/2018  . Lung cancer ([place])   . Psychosis ([place]) 04/08/2018  . Seizures ([place]) 02/07/2018  . Thyroid disease   . TIA (transient ischemic attack)     Past Surgical History:  Procedure Laterality Date  . cardiac implants and grafts    . HIP ARTHROPLASTY Right 02/12/2019   Procedure: ARTHROPLASTY BIPOLAR HIP (HEMIARTHROPLASTY);  Surgeon: Marybelle Killings, MD;  Location:  [place];  Service: Orthopedics;  Laterality: Right;  . LOBECTOMY    . REPLACEMENT TOTAL KNEE Bilateral   . TOTAL HIP ARTHROPLASTY Left 10/05/2018   Procedure: HEMI HIP ARTHROPLASTY ANTERIOR APPROACH;  Surgeon: Leandrew Koyanagi, MD;  Location: [place];  Service: Orthopedics;  Laterality: Left;     HPI  from the history and physical done on the day of admission:    Casey Wood  is a 86 y.o. female  with h/o advanced dementia, Afib anticoagulated with ASA, hip fracture with arthroplasty (left 09/2018 and right 01/2019) presenting from SNF with rectal bleeding. Per ED provider: family agreeable to admission and transfusion but prefer avoiding aggressive interventions.   --History is limited due to advanced dementia with severe cognitive and memory deficits  --Currently her Hgb is 12.2, INR 1.0.  -Creatinine 0.58, sodium is 142, potassium is 4.1, LFTs are not elevated    Hospital Course:    1) rectal bleeding--- recent lower GI bleed---cannot rule out diverticular bleed Versus hemorrhoidal bleed--- family does not desire aggressive management, -Discussed with POAPatient's daughter , Ms Claiborne Billings -she requests that we avoid endoluminal evaluation  or other aggressive treatment protocols,  -She has requested transition to comfort care -Please see MOST form -Stop aspirin -Discharge hemoglobin is 10.9 today  2) advanced dementia with significant cognitive and memory deficits deficits--with occasional behavioral disturbance -discharged on Zyprexa, lorazepam and morphine sulfate  3)PAFib--stable, comfort care only  4)Hip fracture with arthroplasty (left 10/05/2018 and right 01/2019) --- due to mechanical falls --- stop aspirin -Comfort care only  5) hypothyroidism--- comfort care only  6)Social/Ethics/Disposition-DNR/DNI 1)Discharge back to SNF with focus on comfort care 2)Please see care instructions as outlined in MOST form---- 3)Please notify POA/patient's daughter if any change in  condition, please do Not transport to the hospital unless asked by patient's daughter to do so 4)Please Use morphine sulfate, lorazepam and Zyprexa as needed for discomfort restlessness agitation anxiety or sleep 5)Please Avoid blood draws or imaging studies unless specifically approved by patient's daughter/POA---  Discharge Condition: Medically stable  Follow UP  Contact information for after-discharge care    [facility]erred [facility] .   Service: Skilled Nursing Contact information: [address] [place] [phone]               Consults obtained - na  Diet and Activity recommendation:  As advised  Discharge Instructions   1)Discharge back to SNF with focus on comfort care 2)Please see care instructions as outlined in MOST form---- 3)Please notify POA/patient's daughter if any change in condition, please do Not transport to the hospital unless asked by patient's daughter to do so 4)Please Use morphine sulfate, lorazepam and Zyprexa as needed for discomfort restlessness agitation anxiety or sleep 5)Please Avoid blood draws or imaging studies unless specifically approved by patient's daughter/POA     Discharge Medications     Allergies as of 04/16/2019      Reactions   Levofloxacin    unknown      Medication List    STOP taking these medications   aspirin 325 MG tablet   busPIRone 5 MG tablet Commonly known as: BUSPAR   diltiazem 240 MG 24 hr capsule Commonly known as: TIAZAC   levothyroxine 50 MCG tablet Commonly known as: SYNTHROID   senna 8.6 MG Tabs tablet Commonly known as: SENOKOT   SEROQUEL PO   traMADol 50 MG tablet Commonly known as: ULTRAM     TAKE these medications   acetaminophen 325 MG tablet Commonly known as: TYLENOL Take 2 tablets (650 mg total) by mouth every 6 (six) hours as needed for mild pain, fever or headache (or Fever >/= 101). What changed:   when to take  this  reasons to take this   bisacodyl 10 MG suppository Commonly known as: DULCOLAX Place 1 suppository (10 mg total) rectally daily as needed for moderate constipation.   docusate 50 MG/5ML liquid Commonly known as: COLACE Take 20 mLs (200 mg total) by mouth 2 (two) times daily. What changed: how much to take   feeding supplement (PRO-STAT SUGAR FREE 64) Liqd Take 30 mLs by mouth 2 (two) times daily between meals.   LORazepam 2 MG/ML concentrated solution Commonly known as: ATIVAN Take 0.5 mLs (1 mg total) by mouth every 8 (eight) hours as needed for anxiety or sleep.   morphine 20 MG/ML concentrated solution Commonly known as: ROXANOL Take 0.5 mLs (10 mg total) by mouth every 4 (four) hours as needed for severe pain or anxiety.   NON FORMULARY Magic cup daily- for increased nutrition intake Once A Day 02:00  PM   NON FORMULARY Diet Change: Dysphagia 1 (puree), thin liquids   OcuSoft Eyelid Cleansing Pads Apply 1 application topically 2 (two) times daily. cleanse both eyes BID Twice A Day 09:00 AM, 09:00 PM   OLANZapine zydis 5 MG disintegrating tablet Commonly known as: ZYPREXA Take 1 tablet (5 mg total) by mouth at bedtime.   ondansetron 4 MG disintegrating tablet Commonly known as: Zofran ODT Take 1 tablet (4 mg total) by mouth every 8 (eight) hours as needed for nausea or vomiting.   polyethylene glycol 17 g packet Commonly known as: MIRALAX / GLYCOLAX Take 17 g by mouth daily as needed for mild constipation.   polyvinyl alcohol 1.4 % ophthalmic solution Commonly known as: Artificial Tears Place 1 drop into both eyes as needed for dry eyes.   Venelex Oint Apply 1 application topically See admin instructions. Apply to sacrum, coccyx, and bilateral buttocks every shift and as needed for wound       Major procedures and Radiology Reports - PLEASE review detailed and final reports for all details, in brief -   No results found.  Micro Results   Recent  Results (from the past 240 hour(s))  SARS CORONAVIRUS 2 (TAT 6-24 HRS) Nasopharyngeal Nasopharyngeal Swab     Status: None   Collection Time: 04/12/19 12:03 PM   Specimen: Nasopharyngeal Swab  Result Value Ref Range Status   SARS Coronavirus 2 NEGATIVE NEGATIVE Final    Comment: (NOTE) SARS-CoV-2 target nucleic acids are NOT DETECTED. The SARS-CoV-2 RNA is generally detectable in upper and lower respiratory specimens during the acute phase of infection. Negative results do not preclude SARS-CoV-2 infection, do not rule out co-infections with other pathogens, and should not be used as the sole basis for treatment or other patient management decisions. Negative results must be combined with clinical observations, patient history, and epidemiological information. The expected result is Negative. Fact Sheet for Patients: SugarRoll.be Fact Sheet for Healthcare Providers: https://www.woods-mathews.com/ This test is not yet approved or cleared by the [place] FDA and  has been authorized for detection and/or diagnosis of SARS-CoV-2 by FDA under an Emergency Use Authorization (EUA). This EUA will remain  in effect (meaning this test can be used) for the duration of the COVID-19 declaration under Section 56 4(b)(1) of the Act, 21 U.S.C. section 360bbb-3(b)(1), unless the authorization is terminated or revoked sooner. Performed at [facility], [place] [address], [place], [place] [address]   MRSA PCR Screening     Status: Abnormal   Collection Time: 04/12/19  3:57 PM   Specimen: Nasopharyngeal  Result Value Ref Range Status   MRSA by PCR POSITIVE (A) NEGATIVE Final    Comment:        The GeneXpert MRSA Assay (FDA approved for NASAL specimens only), is one component of a comprehensive MRSA colonization surveillance program. It is not intended to diagnose MRSA infection nor to guide or monitor treatment for MRSA infections. RESULT  CALLED TO, READ BACK BY AND VERIFIED WITH: J SWEAT,RN @1949  04/12/19 [facility] Performed at [facility], [address], [place], [place] [address]    Today   Subjective    Casey Wood today has no New  complaints -  -Discussed with patient's POA/daughter Ms. Claiborne Billings  1)Discharge back to SNF with focus on comfort care 2)Please see care instructions as outlined in MOST form---- 3)Please notify POA/patient's daughter if any change in condition, please do Not transport to the hospital unless asked by patient's daughter to do  so 4)Please Use morphine sulfate, lorazepam and Zyprexa as needed for discomfort restlessness agitation anxiety or sleep 5)Please Avoid blood draws or imaging studies unless specifically approved by patient's daughter/POA   Patient has been seen and examined prior to discharge   Objective   Blood pressure (!) 128/95, pulse 90, temperature 97.9 F (36.6 C), temperature source Axillary, resp. rate 14, weight 38.2 kg, SpO2 99 %.   Intake/Output Summary (Last 24 hours) at 03/27/2019 1509 Last data filed at 04/01/2019 0900 Gross per 24 hour  Intake 0 ml  Output --  Net 0 ml    Exam Gen:- Awake Alert, no acute distress  HEENT:- [place].AT, No sclera icterus Neck-Supple Neck,No JVD,.  Lungs-  CTAB , good air movement bilaterally  CV- S1, S2 normal, regular Abd-  +ve B.Sounds, Abd Soft, No tenderness,    Extremity/Skin:- No  edema,   good pulses Psych-Baseline cognitive and memory deficits , restless from time to time neuro-no new focal deficits, no tremors  , moving all extremities   Data Review   CBC w Diff:  Lab Results  Component Value Date   WBC 5.8 04/17/2019   HGB 10.9 (L) 04/16/2019   HCT 34.1 (L) 03/28/2019   PLT 168 04/01/2019   LYMPHOPCT 22 04/12/2019   MONOPCT 9 04/12/2019   EOSPCT 2 04/12/2019   BASOPCT 1 04/12/2019    CMP:  Lab Results  Component Value Date   NA 141 03/26/2019   K 3.8 04/20/2019   CL 108 04/09/2019   CO2 21 (L)  04/01/2019   BUN 29 (H) 04/03/2019   CREATININE 0.58 03/30/2019   PROT 6.3 (L) 04/12/2019   ALBUMIN 3.5 04/12/2019   BILITOT 0.7 04/12/2019   ALKPHOS 108 04/12/2019   AST 19 04/12/2019   ALT 14 04/12/2019  .   Total Discharge time is about 33 minutes  Roxan Hockey M.D on 04/21/2019 at 3:09 PM  Go to [email or web address] -  for contact info  [facility] - Office  [phone]

## 2019-04-13 NOTE — Progress Notes (Signed)
Dr. Denton Brick talked to pt's daughter over the phone to get consent for MOST form.  Daughter will sign form today before patient is transported back to SNF.

## 2019-04-13 NOTE — Progress Notes (Signed)
Subjective: Non-communicative, significant dementia. No apparent pain, no obviously blood/bloody stool in the bed. Spoke with nursing who noted charted BM yesterday evening. Review of flowsheet shows smear/leakage, red. No note of obvious large GI bleed.  Objective: Vital signs in last 24 hours: Temp:  [97.7 F (36.5 C)-97.9 F (36.6 C)] 97.9 F (36.6 C) (02/19 0553) Pulse Rate:  [55-113] 90 (02/19 0853) Resp:  [13-20] 14 (02/19 0553) BP: (100-131)/(65-95) 128/95 (02/19 0553) SpO2:  [95 %-100 %] 99 % (02/19 0553) Last BM Date: 04/12/19 General:   Alert and significantly confused Head:  Normocephalic and atraumatic. Eyes:  No icterus, sclera clear. Conjuctiva pink.  Heart:  S1, S2 present, no murmurs noted.  Lungs: Clear to auscultation bilaterally, without wheezing, rales, or rhonchi.  Abdomen:  Bowel sounds present, soft. No obvious tenderness, non-distended. No HSM or hernias noted. No rebound or guarding. Msk:  Symmetrical without gross deformities. Extremities:  Without clubbing or edema. Neurologic:  Alert with significant dementia/confusion; essentially no meaningful communication. Skin:  Warm and dry, intact without significant lesions.  Psych:  Alert and cooperative. Normal mood and affect.  Intake/Output from previous day: No intake/output data recorded. Intake/Output this shift: No intake/output data recorded.  Lab Results: Recent Labs    04/12/19 0800 04/12/19 0800 04/12/19 1415 04/12/19 1709 04/18/2019 0524  WBC 7.7  --   --   --  5.8  HGB 12.2   < > 11.8* 11.6* 10.9*  HCT 37.0   < > 36.0 35.0* 34.1*  PLT 198  --   --   --  168   < > = values in this interval not displayed.   BMET Recent Labs    04/12/19 0800 03/26/2019 0524  NA 140 141  K 4.4 3.8  CL 104 108  CO2 25 21*  GLUCOSE 96 79  BUN 30* 29*  CREATININE 0.58 0.58  CALCIUM 9.3 8.7*   LFT Recent Labs    04/12/19 0800  PROT 6.3*  ALBUMIN 3.5  AST 19  ALT 14  ALKPHOS 108  BILITOT  0.7   PT/INR Recent Labs    04/12/19 0800  LABPROT 13.1  INR 1.0   Hepatitis Panel No results for input(s): HEPBSAG, HCVAB, HEPAIGM, HEPBIGM in the last 72 hours.   Studies/Results: No results found.  Assessment: 86 y/o female with h/o advanced dementia, Afib anticoagulated with ASA, hip fracture with arthroplasty (left 09/2018 and right 01/2019) presenting from SNF with rectal bleeding. Per ED provider: family agreeable to admission and transfusion but prefer avoiding aggressive interventions. Hgb on admission was 12.2, INR 1.0.   Unknown when last colonoscopy was. No significant imaging available.  Suspect possible diverticular bleeding. Doubt rapid transit UGI bleed given hemodynamic stability. Cannot exclude hemorrhoidal bleeding or from malignancy. Family desires avoiding aggressive interventions. Spoke to patient's nurse, Meagan, patient has had two small amounts of dark blood per rectum since in the ED.   CBC has drifted since admission 12.2 > 11.8 > 11.6 > 10.9. Per nursing one episode of bleeding overnight (smear/leakage amount).  Attempted to contact patient's daughter x 2, along with Dr. Denton Brick, to confirm preference for no endoscopic intervention.   Plan: 1. Monitor H/H 2. Transfuse as necessary 3. Add Colace stool softener daily 4. Supportive measures 5. Monitor hgb at nursing facility upon discharge 6. Anticipate d/c in the next 24-48 hours   Thank you for allowing [place] to participate in the care of [place], DNP, AGNP-C Adult &  Gerontological Nurse Practitioner [facility]     LOS: 0 days    04/17/2019, 9:28 AM

## 2019-04-13 NOTE — Progress Notes (Signed)
Initial Nutrition Assessment  DOCUMENTATION CODES:   Underweight  INTERVENTION:  Continue 30 ml Pro-stat BID  Provide Boost Breeze po BID, each supplement provides 250 kcal and 9 grams of protein  Advance to puree diet as medically feasible  Recommend feeding assistance with meals to encourage po intake  NUTRITION DIAGNOSIS:   Inadequate oral intake related to lethargy/confusion(advanced dementia) as evidenced by meal completion < 25%.    GOAL:   Patient will meet greater than or equal to 90% of their needs    MONITOR:   PO intake, Weight trends, Diet advancement, Supplement acceptance, Labs, I & O's  REASON FOR ASSESSMENT:   Malnutrition Screening Tool    ASSESSMENT:  RD working remotely.  86 year old female with past medical history of advanced dementia with severe cognitive and memory deficits, atrial fibrillation, hip fracture s/p left arthroplasty (09/2018) s/p right arthroplasty (01/2019), presented from [facility] with rectal bleeding and admitted for transfusion.  Per notes: GI suspects possible diverticular bleeding, full assessment pending today. Family desires avoiding aggressive interventions.  Patient discussed with [facility] RD. Patient has been on comfort care since 02/07/2018. Patient is chronically underweight and has a history of malnutrition.   She is on a pureed diet at facility and consumes 25-75% of meals. Currently, patient is on CL diet, documented with 0% intake of breakfast this morning. Patient is provided 30 ml Pro-stat twice daily. Will provide Boost Breeze to aid with estimated needs.  Non-pitting LLE edema per RN assessment.  Current wt 84.04 lbs PNC RD reports stable weight history   Medications reviewed and include:Protonix, Senokot  Labs: BG 79, 96 x 24 hrs, BUN 29 (H), Hgb 10.9 (L) trending down  NUTRITION - FOCUSED PHYSICAL EXAM: Unable to complete at this time, RD working remotely.  Diet Order:   Diet Order            Diet clear  liquid Room service appropriate? Yes; Fluid consistency: Thin  Diet effective now              EDUCATION NEEDS:   No education needs have been identified at this time  Skin:  Skin Assessment: Skin Integrity Issues: Skin Integrity Issues:: Incisions, Other (Comment) Incisions: Closed; R hip Other: MASD: perineum  Last BM:  2/18 type 7  Height:   Ht Readings from Last 1 Encounters:  03/22/19 5\' 2"  (1.575 m)    Weight:   Wt Readings from Last 1 Encounters:  04/12/19 38.2 kg    BMI:  Body mass index is 15.4 kg/m.  Estimated Nutritional Needs:   Kcal:  1150-1350  Protein:  58-68  Fluid:  >/= 1 L/day   Lajuan Lines, RD, LDN Clinical Nutrition [place] Telephone [phone] After Hours/Weekend Pager # in [facility]

## 2019-04-13 NOTE — Plan of Care (Signed)
  Problem: Safety: Goal: Ability to remain free from injury will improve Outcome: Progressing   Problem: Skin Integrity: Goal: Risk for impaired skin integrity will decrease Outcome: Progressing   

## 2019-04-13 NOTE — Plan of Care (Signed)
  Problem: Education: Goal: Ability to identify signs and symptoms of gastrointestinal bleeding will improve Outcome: Adequate for Discharge   Problem: Bowel/Gastric: Goal: Will show no signs and symptoms of gastrointestinal bleeding Outcome: Adequate for Discharge   Problem: Fluid Volume: Goal: Will show no signs and symptoms of excessive bleeding Outcome: Adequate for Discharge   Problem: Clinical Measurements: Goal: Complications related to the disease process, condition or treatment will be avoided or minimized Outcome: Adequate for Discharge   Problem: Education: Goal: Knowledge of General Education information will improve Description: Including pain rating scale, medication(s)/side effects and non-pharmacologic comfort measures Outcome: Adequate for Discharge   Problem: Health Behavior/Discharge Planning: Goal: Ability to manage health-related needs will improve Outcome: Adequate for Discharge   Problem: Clinical Measurements: Goal: Ability to maintain clinical measurements within normal limits will improve Outcome: Adequate for Discharge Goal: Will remain free from infection Outcome: Adequate for Discharge Goal: Diagnostic test results will improve Outcome: Adequate for Discharge Goal: Respiratory complications will improve Outcome: Adequate for Discharge Goal: Cardiovascular complication will be avoided Outcome: Adequate for Discharge   Problem: Activity: Goal: Risk for activity intolerance will decrease Outcome: Adequate for Discharge   Problem: Nutrition: Goal: Adequate nutrition will be maintained Outcome: Adequate for Discharge   Problem: Coping: Goal: Level of anxiety will decrease Outcome: Adequate for Discharge   Problem: Elimination: Goal: Will not experience complications related to bowel motility Outcome: Adequate for Discharge Goal: Will not experience complications related to urinary retention Outcome: Adequate for Discharge   Problem: Pain  Managment: Goal: General experience of comfort will improve Outcome: Adequate for Discharge   Problem: Safety: Goal: Ability to remain free from injury will improve 04/14/2019 1423 by Zachery Conch, RN Outcome: Adequate for Discharge 04/18/2019 1141 by Zachery Conch, RN Outcome: Progressing   Problem: Skin Integrity: Goal: Risk for impaired skin integrity will decrease 04/07/2019 1423 by Zachery Conch, RN Outcome: Adequate for Discharge 03/29/2019 1141 by Zachery Conch, RN Outcome: Progressing

## 2019-04-13 NOTE — Progress Notes (Signed)
Nsg Discharge Note  Admit Date:  04/12/2019 Discharge date: 04/03/2019   Everlene Farrier A Kilmer to be D/C'd to [facility] per MD order.  AVS completed.  Copy for chart, and copy for patient signed, and dated. Patient/caregiver able to verbalize understanding.  Discharge Medication: Allergies as of 04/15/2019      Reactions   Levofloxacin    unknown      Medication List    STOP taking these medications   aspirin 325 MG tablet   busPIRone 5 MG tablet Commonly known as: BUSPAR   diltiazem 240 MG 24 hr capsule Commonly known as: TIAZAC   levothyroxine 50 MCG tablet Commonly known as: SYNTHROID   senna 8.6 MG Tabs tablet Commonly known as: SENOKOT   SEROQUEL PO   traMADol 50 MG tablet Commonly known as: ULTRAM     TAKE these medications   acetaminophen 325 MG tablet Commonly known as: TYLENOL Take 2 tablets (650 mg total) by mouth every 6 (six) hours as needed for mild pain, fever or headache (or Fever >/= 101). What changed:   when to take this  reasons to take this   bisacodyl 10 MG suppository Commonly known as: DULCOLAX Place 1 suppository (10 mg total) rectally daily as needed for moderate constipation.   docusate 50 MG/5ML liquid Commonly known as: COLACE Take 20 mLs (200 mg total) by mouth 2 (two) times daily. What changed: how much to take   feeding supplement (PRO-STAT SUGAR FREE 64) Liqd Take 30 mLs by mouth 2 (two) times daily between meals.   LORazepam 2 MG/ML concentrated solution Commonly known as: ATIVAN Take 0.3 mLs (0.6 mg total) by mouth every 8 (eight) hours as needed for anxiety or sleep.   morphine 20 MG/ML concentrated solution Commonly known as: ROXANOL Take 0.25 mLs (5 mg total) by mouth every 2 (two) hours as needed for severe pain or anxiety.   NON FORMULARY Magic cup daily- for increased nutrition intake Once A Day 02:00 PM   NON FORMULARY Diet Change: Dysphagia 1 (puree), thin liquids   OcuSoft Eyelid Cleansing  Pads Apply 1 application topically 2 (two) times daily. cleanse both eyes BID Twice A Day 09:00 AM, 09:00 PM   OLANZapine zydis 5 MG disintegrating tablet Commonly known as: ZYPREXA Take 1 tablet (5 mg total) by mouth at bedtime.   ondansetron 4 MG disintegrating tablet Commonly known as: Zofran ODT Take 1 tablet (4 mg total) by mouth every 8 (eight) hours as needed for nausea or vomiting.   polyethylene glycol 17 g packet Commonly known as: MIRALAX / GLYCOLAX Take 17 g by mouth daily as needed for mild constipation.   polyvinyl alcohol 1.4 % ophthalmic solution Commonly known as: Artificial Tears Place 1 drop into both eyes as needed for dry eyes.   Venelex Oint Apply 1 application topically See admin instructions. Apply to sacrum, coccyx, and bilateral buttocks every shift and as needed for wound       Discharge Assessment: Vitals:   04/15/2019 0553 04/03/2019 0853  BP: (!) 128/95   Pulse: 97 90  Resp: 14   Temp: 97.9 F (36.6 C)   SpO2: 99%    Skin clean, dry and intact without evidence of skin break down, no evidence of skin tears noted. IV catheter discontinued intact. Site without signs and symptoms of complications - no redness or edema noted at insertion site, patient denies c/o pain - only slight tenderness at site.  Dressing with slight pressure applied.  D/c  Instructions-Education: Discharge instructions given to [facility] nurse with verbalized understanding. D/c education completed with patient/family including follow up instructions, medication list, d/c activities limitations if indicated, with other d/c instructions as indicated by MD - patient able to verbalize understanding, all questions fully answered. Patient instructed to return to ED, call 911, or call MD for any changes in condition.  Patient escorted to [facility].  Zachery Conch, RN 04/07/2019 4:29 PM

## 2019-04-18 ENCOUNTER — Encounter: Payer: Self-pay | Admitting: Internal Medicine

## 2019-04-18 ENCOUNTER — Non-Acute Institutional Stay (SKILLED_NURSING_FACILITY): Payer: Medicare HMO | Admitting: Internal Medicine

## 2019-04-18 DIAGNOSIS — K922 Gastrointestinal hemorrhage, unspecified: Secondary | ICD-10-CM | POA: Diagnosis not present

## 2019-04-18 DIAGNOSIS — F0390 Unspecified dementia without behavioral disturbance: Secondary | ICD-10-CM | POA: Diagnosis not present

## 2019-04-18 DIAGNOSIS — E039 Hypothyroidism, unspecified: Secondary | ICD-10-CM | POA: Diagnosis not present

## 2019-04-18 DIAGNOSIS — F03C0 Unspecified dementia, severe, without behavioral disturbance, psychotic disturbance, mood disturbance, and anxiety: Secondary | ICD-10-CM

## 2019-04-18 DIAGNOSIS — I480 Paroxysmal atrial fibrillation: Secondary | ICD-10-CM

## 2019-04-18 DIAGNOSIS — F419 Anxiety disorder, unspecified: Secondary | ICD-10-CM

## 2019-04-18 NOTE — Progress Notes (Signed)
: Provider:  Hennie Duos., MD Location:  [facility] Room Number: 155-P Place of Service:  SNF ([phone])  PCP: Hennie Duos, MD Patient Care Team: Hennie Duos, MD as PCP - General (Internal Medicine) [place], Peter M, MD as PCP - Cardiology (Cardiology) Nyoka Cowden Phylis Bougie, NP as Nurse Practitioner ([place]) [facility], [place] ([place])  Extended Emergency Contact Information Primary Emergency Contact: KELLY,WENDY Address: [address], [place] [address] [place] Phone: [phone] Relation: Daughter Secondary Emergency Contact: Quentin Ore Address: [address] [place] [address] [place] [employer] Phone: [phone] Relation: Daughter     Allergies: Levofloxacin  Chief Complaint  Patient presents with  . Readmit To SNF    Readmission to [facility]    HPI: Patient is a 86 y.o. female with advanced dementia, atrial fib anticoagulated with aspirin, hip fracture with arthroplasty left on 09/2018 and right on 01/2019 presenting to [facility] ED with rectal bleeding.  Per ED provider family agreeable to admission and transfusion before first avoid aggressive intervention.  In the ED patient's hemoglobin was 12.2 and INR was 1.0.  Patient was admitted to [facility] from 12/18-19 where she was found to have rectal bleeding with a history of recent lower GI bleed, cannot rule out diverticular bleed versus hemorrhoidal bleed and patient did not desire aggressive management.  Patient's daughter Claiborne Billings requested transition to comfort care MOST form stop aspirin.  Discharge hemoglobin 10.9.  Patient requested Zyprexa lorazepam and morphine only.  Patient is admitted back to skilled nursing facility for comfort care.  Past Medical History:  Diagnosis Date  . Alcohol abuse 02/07/2018  . Atrial fibrillation ([place])   . Atrial  fibrillation, permanent ([address]) 02/13/2019  . CHF (congestive heart failure) ([place]) 02/07/2018  . Closed left humeral fracture 10/14/2018  . Dementia ([place]) 02/07/2018  . Dysphagia   . GERD (gastroesophageal reflux disease)   . High cholesterol   . Hypertension   . Hypokalemia   . Left displaced femoral neck fracture ([place]) 10/03/2018  . Lung cancer ([place])   . Psychosis ([place]) 04/08/2018  . Seizures ([place]) 02/07/2018  . Thyroid disease   . TIA (transient ischemic attack)     Past Surgical History:  Procedure Laterality Date  . cardiac implants and grafts    . HIP ARTHROPLASTY Right 02/12/2019   Procedure: ARTHROPLASTY BIPOLAR HIP (HEMIARTHROPLASTY);  Surgeon: Marybelle Killings, MD;  Location: [facility];  Service: Orthopedics;  Laterality: Right;  . LOBECTOMY    . REPLACEMENT TOTAL KNEE Bilateral   . TOTAL HIP ARTHROPLASTY Left 10/05/2018   Procedure: HEMI HIP ARTHROPLASTY ANTERIOR APPROACH;  Surgeon: Leandrew Koyanagi, MD;  Location: [facility];  Service: Orthopedics;  Laterality: Left;    Allergies as of 04/18/2019      Reactions   Levofloxacin    unknown      Medication List    Notice   This visit is during an admission. Changes to the med list made in this visit will be reflected in the After Visit Summary of the admission.    Current Outpatient Medications on File Prior to Visit  Medication Sig Dispense Refill  . acetaminophen (TYLENOL) 325 MG tablet Take 650 mg by mouth 3 (three) times daily as needed.    Marland Kitchen  Amino Acids-Protein Hydrolys (FEEDING SUPPLEMENT, PRO-STAT SUGAR FREE 64,) LIQD Take 30 mLs by mouth 2 (two) times daily between meals.    Roseanne Kaufman [place]-Castor Oil (VENELEX) OINT Apply 1 application topically See admin instructions. Apply to sacrum, coccyx, and bilateral buttocks every shift and as needed for wound    . Eyelid Cleansers (OCUSOFT EYELID CLEANSING) PADS Apply 1 application topically 2 (two) times daily. cleanse both eyes BID Twice A Day 09:00 AM, 09:00 PM     . LORazepam  (ATIVAN) 2 MG/ML concentrated solution Take 0.5 mg by mouth every 8 (eight) hours as needed for anxiety.    Marland Kitchen morphine (ROXANOL) 20 MG/ML concentrated solution Take 0.25 mLs (5 mg total) by mouth every 2 (two) hours as needed for severe pain or anxiety. 30 mL 0  . NON FORMULARY Diet Change: Dysphagia 1 (puree), thin liquids    . NON FORMULARY Magic cup daily- for increased nutrition intake Once A Day 02:00 PM    . OLANZapine zydis (ZYPREXA) 5 MG disintegrating tablet Take 1 tablet (5 mg total) by mouth at bedtime. 30 tablet 0   No current facility-administered medications on file prior to visit.     No orders of the defined types were placed in this encounter.   Immunization History  Administered Date(s) Administered  . Influenza-Unspecified 11/27/2018  . Moderna SARS-COVID-2 Vaccination 02/28/2019, 03/28/2019  . Pneumococcal Polysaccharide-23 08/10/2018  . Pneumococcal-Unspecified 11/14/2018  . Td 11/14/2018    Social History   Tobacco Use  . Smoking status: Never Smoker  . Smokeless tobacco: Never Used  Substance Use Topics  . Alcohol use: Not Currently    Family history is   Family History  Problem Relation Age of Onset  . Heart attack Father        8s     Review of Systems unable to obtain secondary to dementia   Vitals:   04/18/19 1118  BP: 108/68  Pulse: 74  Resp: 18  Temp: (!) 96.8 F (36 C)  SpO2: 95%    SpO2 Readings from Last 1 Encounters:  04/18/19 95%   Body mass index is 15.86 kg/m.     Physical Exam  GENERAL APPEARANCE: Alert,  No acute distress.  SKIN: No diaphoresis rash HEAD: Normocephalic, atraumatic  EYES: Conjunctiva/lids clear. Pupils round, reactive. EOMs intact.  EARS: External exam WNL, canals clear. Hearing grossly normal.  NOSE: No deformity or discharge.  MOUTH/THROAT: Lips w/o lesions  RESPIRATORY: Breathing is even, unlabored. Lung sounds are clear   CARDIOVASCULAR: Heart RRR no murmurs, rubs or gallops. No  peripheral edema.   GASTROINTESTINAL: Abdomen is soft, non-tender, not distended w/ normal bowel sounds. GENITOURINARY: Bladder non tender, not distended  MUSCULOSKELETAL: No abnormal joints or musculature NEUROLOGIC:  Cranial nerves 2-12 grossly intact. Moves all extremities  PSYCHIATRIC: Dementia  Patient Active Problem List   Diagnosis Date Noted  . Lower GI bleed   . GI bleed 04/12/2019  . Subcapital fracture of femur, right, closed, with routine healing, subsequent encounter 02/24/2019  . Constipation 02/24/2019  . Closed displaced fracture of right femoral neck ([place]) 02/22/2019  . HCAP (healthcare-associated pneumonia) 02/22/2019  . Cellulitis of right hip 02/22/2019  . Medicare annual wellness visit, subsequent 01/23/2019  . Goals of care, counseling/discussion   . Palliative care by specialist   . Chronic anxiety 05/11/2018  . Acquired hypothyroidism 04/25/2018  . Psychosis in elderly with behavioral disturbance ([place]) 04/08/2018  . Atrial fibrillation with RVR ([place]) 02/07/2018  . Advanced dementia ([place])  02/07/2018  . Seizures ([place]) 02/07/2018  . Alcohol abuse 02/07/2018  . CHF (congestive heart failure) ([place]) 02/07/2018      Labs reviewed: Basic Metabolic Panel:    Component Value Date/Time   NA 141 04/14/2019 0524   K 3.8 04/12/2019 0524   CL 108 04/09/2019 0524   CO2 21 (L) 04/09/2019 0524   GLUCOSE 79 03/29/2019 0524   BUN 29 (H) 03/29/2019 0524   CREATININE 0.58 04/15/2019 0524   CALCIUM 8.7 (L) 03/27/2019 0524   PROT 6.3 (L) 04/12/2019 0800   ALBUMIN 3.5 04/12/2019 0800   AST 19 04/12/2019 0800   ALT 14 04/12/2019 0800   ALKPHOS 108 04/12/2019 0800   BILITOT 0.7 04/12/2019 0800   GFRNONAA >60 03/29/2019 0524   GFRAA >60 04/12/2019 0524    Recent Labs    02/12/19 0332 02/13/19 0408 02/21/19 0615 04/12/19 0800 04/07/2019 0524  NA 142   < > 141 140 141  K 3.9   < > 4.5 4.4 3.8  CL 106   < > 108 104 108  CO2 25   < > 24 25 21*  GLUCOSE 105*   < >  85 96 79  BUN 18   < > 18 30* 29*  CREATININE 0.77   < > 0.54 0.58 0.58  CALCIUM 9.2   < > 8.8* 9.3 8.7*  MG 2.2  --   --   --   --    < > = values in this interval not displayed.   Liver Function Tests: Recent Labs    07/11/18 0600 04/12/19 0800  AST 18 19  ALT 13 14  ALKPHOS 90 108  BILITOT 0.7 0.7  PROT 5.6* 6.3*  ALBUMIN 3.2* 3.5   No results for input(s): LIPASE, AMYLASE in the last 8760 hours. No results for input(s): AMMONIA in the last 8760 hours. CBC: Recent Labs    02/11/19 0845 02/12/19 0332 02/19/19 0836 02/19/19 0836 04/12/19 0800 04/12/19 0800 04/12/19 1415 04/12/19 1709 04/01/2019 0524  WBC 9.2   < > 9.8  --  7.7  --   --   --  5.8  NEUTROABS 6.6  --  7.4  --  5.1  --   --   --   --   HGB 14.7   < > 13.5   < > 12.2   < > 11.8* 11.6* 10.9*  HCT 43.7   < > 40.6   < > 37.0   < > 36.0 35.0* 34.1*  MCV 102.1*   < > 103.0*  --  106.6*  --   --   --  106.9*  PLT 144*   < > 227  --  198  --   --   --  168   < > = values in this interval not displayed.   Lipid No results for input(s): CHOL, HDL, LDLCALC, TRIG in the last 8760 hours.  Cardiac Enzymes: No results for input(s): CKTOTAL, CKMB, CKMBINDEX, TROPONINI in the last 8760 hours. BNP: No results for input(s): BNP in the last 8760 hours. No results found for: [facility] Lab Results  Component Value Date   HGBA1C 4.8 03/17/2018   Lab Results  Component Value Date   TSH 6.297 (H) 08/01/2018   Lab Results  Component Value Date   VITAMINB12 392 01/26/2019   Lab Results  Component Value Date   FOLATE 6.6 01/26/2019   Lab Results  Component Value Date   IRON 129 01/26/2019   TIBC  377 01/26/2019    Imaging and Procedures obtained prior to SNF admission: No results found.   Not all labs, radiology exams or other studies done during hospitalization come through on my EPIC note; however they are reviewed by me.    Assessment and Plan  Rectal bleeding-recent lower GI bleed-cannot rule out  diverticular bleed versus hemorrhoidal bleed; a daughter Claiborne Billings request no aggressive evaluation; discharge hemoglobin 10.9  SNF-comfort care per POA; stop aspirin  Advanced dementia-with occasional behavioral disturbance SNF-continue Zyprexa5 mg nightly, Roxanol 10 mg every 4 hours and Ativan 2 mg per mill, 0.5 mL, 1 mg by mouth every 8 hours.  Paroxysmal atrial fibrillation SNF-stable, comfort care only  Hypothyroidism SNF-comfort care only  Anxiety SNF-lorazepam as needed  Time spent greater than 35 minutes;> 50% of time with patient was spent reviewing records, labs, tests and studies, counseling and developing plan of care  Hennie Duos, MD

## 2019-04-21 ENCOUNTER — Encounter: Payer: Self-pay | Admitting: Internal Medicine

## 2019-04-21 DIAGNOSIS — I4891 Unspecified atrial fibrillation: Secondary | ICD-10-CM | POA: Insufficient documentation

## 2019-04-23 DEATH — deceased

## 2019-05-24 DEATH — deceased

## 2020-08-24 IMAGING — DX LEFT HUMERUS - 2+ VIEW
3 series · 3 of 3 positions shown · non-contrast
Comparison: Chest radiograph dated 10/03/2018

CLINICAL DATA: [AGE] female with fall and left upper
extremity pain. Left humeral fracture seen on the earlier chest
radiograph.

EXAM:
LEFT HUMERUS - 2+ VIEW

[humerus ap]
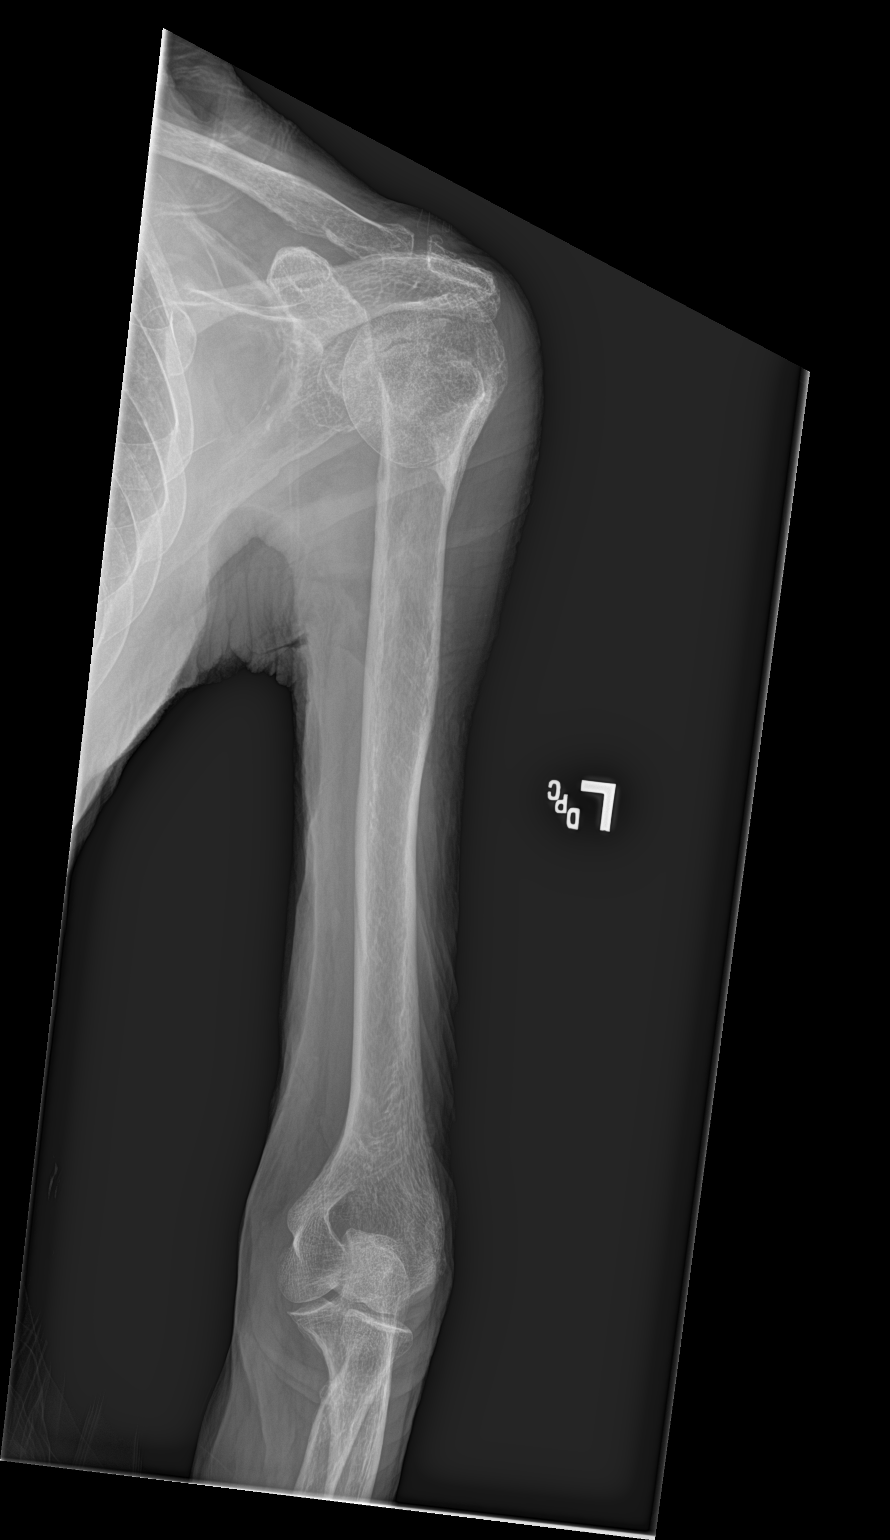

[humerus lat]
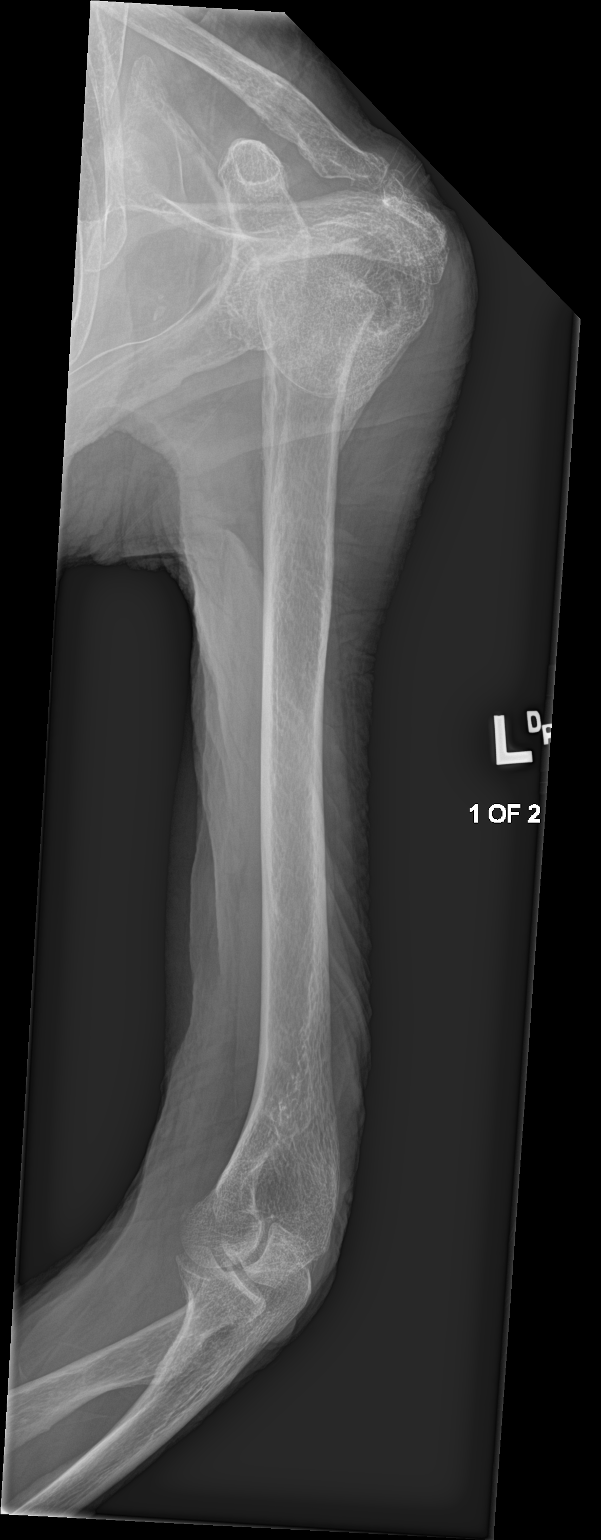

[elbow lat]
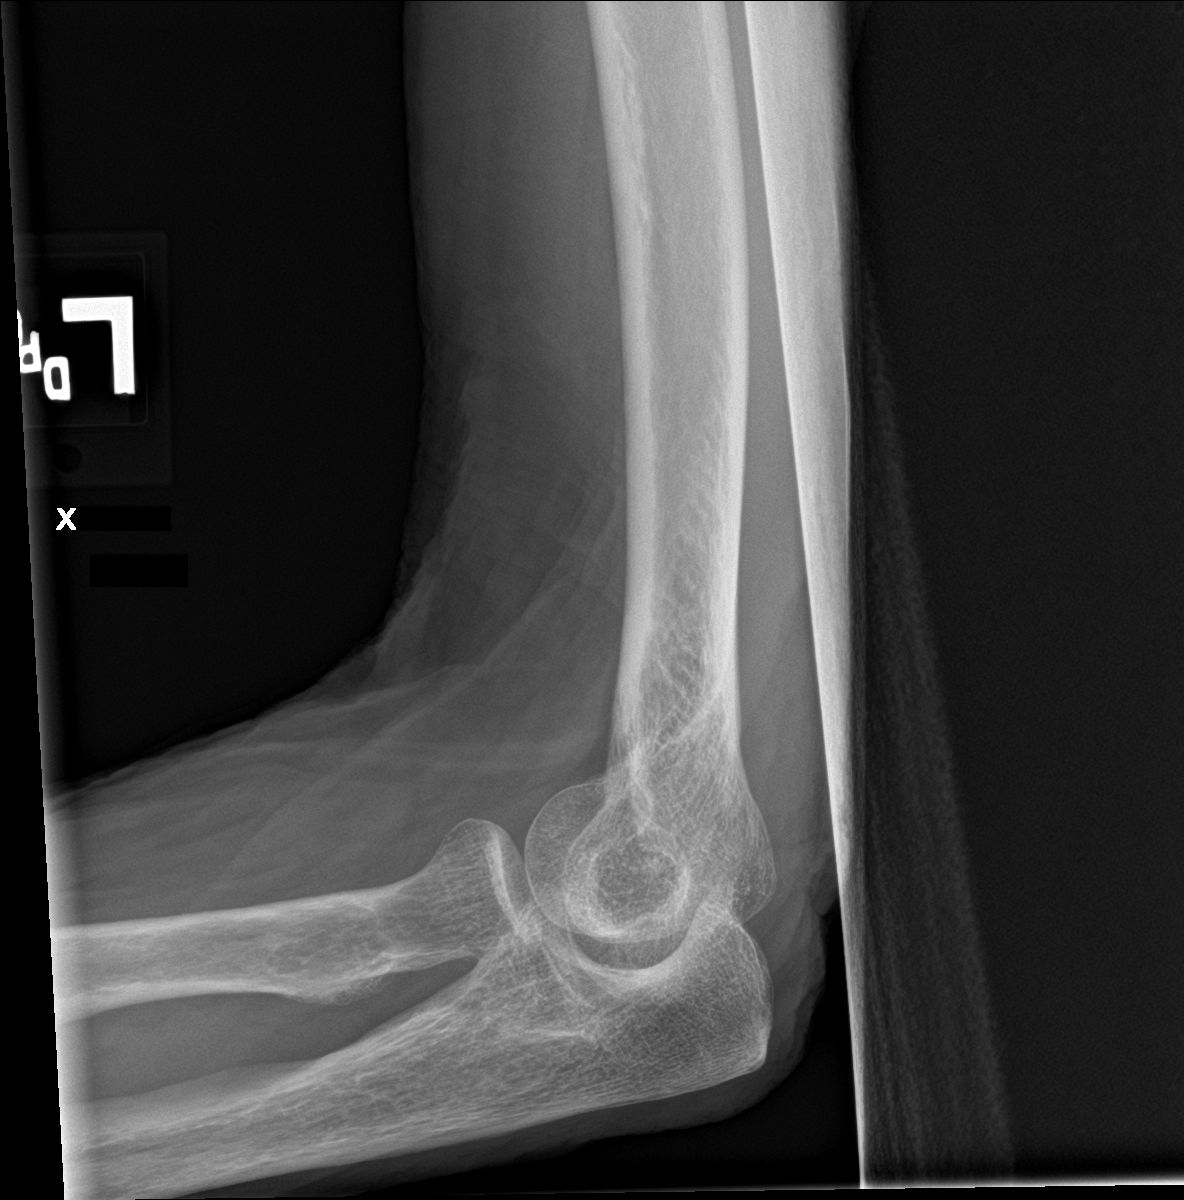

[3 of 3 positions shown; findings below may reference images not displayed]

FINDINGS: There is an age indeterminate fracture of the left humeral neck with
impaction. The bones are osteopenic. There is no dislocation.
Evaluation of the left elbow is limited on the provided images. The
soft tissues are unremarkable.
IMPRESSION: Age indeterminate fracture of the left humeral neck. Clinical
correlation is recommended.

## 2020-08-24 IMAGING — DX CHEST  1 VIEW
1 series · 1 of 1 positions shown · non-contrast
Comparison: None.

CLINICAL DATA: Preop, left hip fracture

EXAM:
CHEST  1 VIEW

[chest pa]
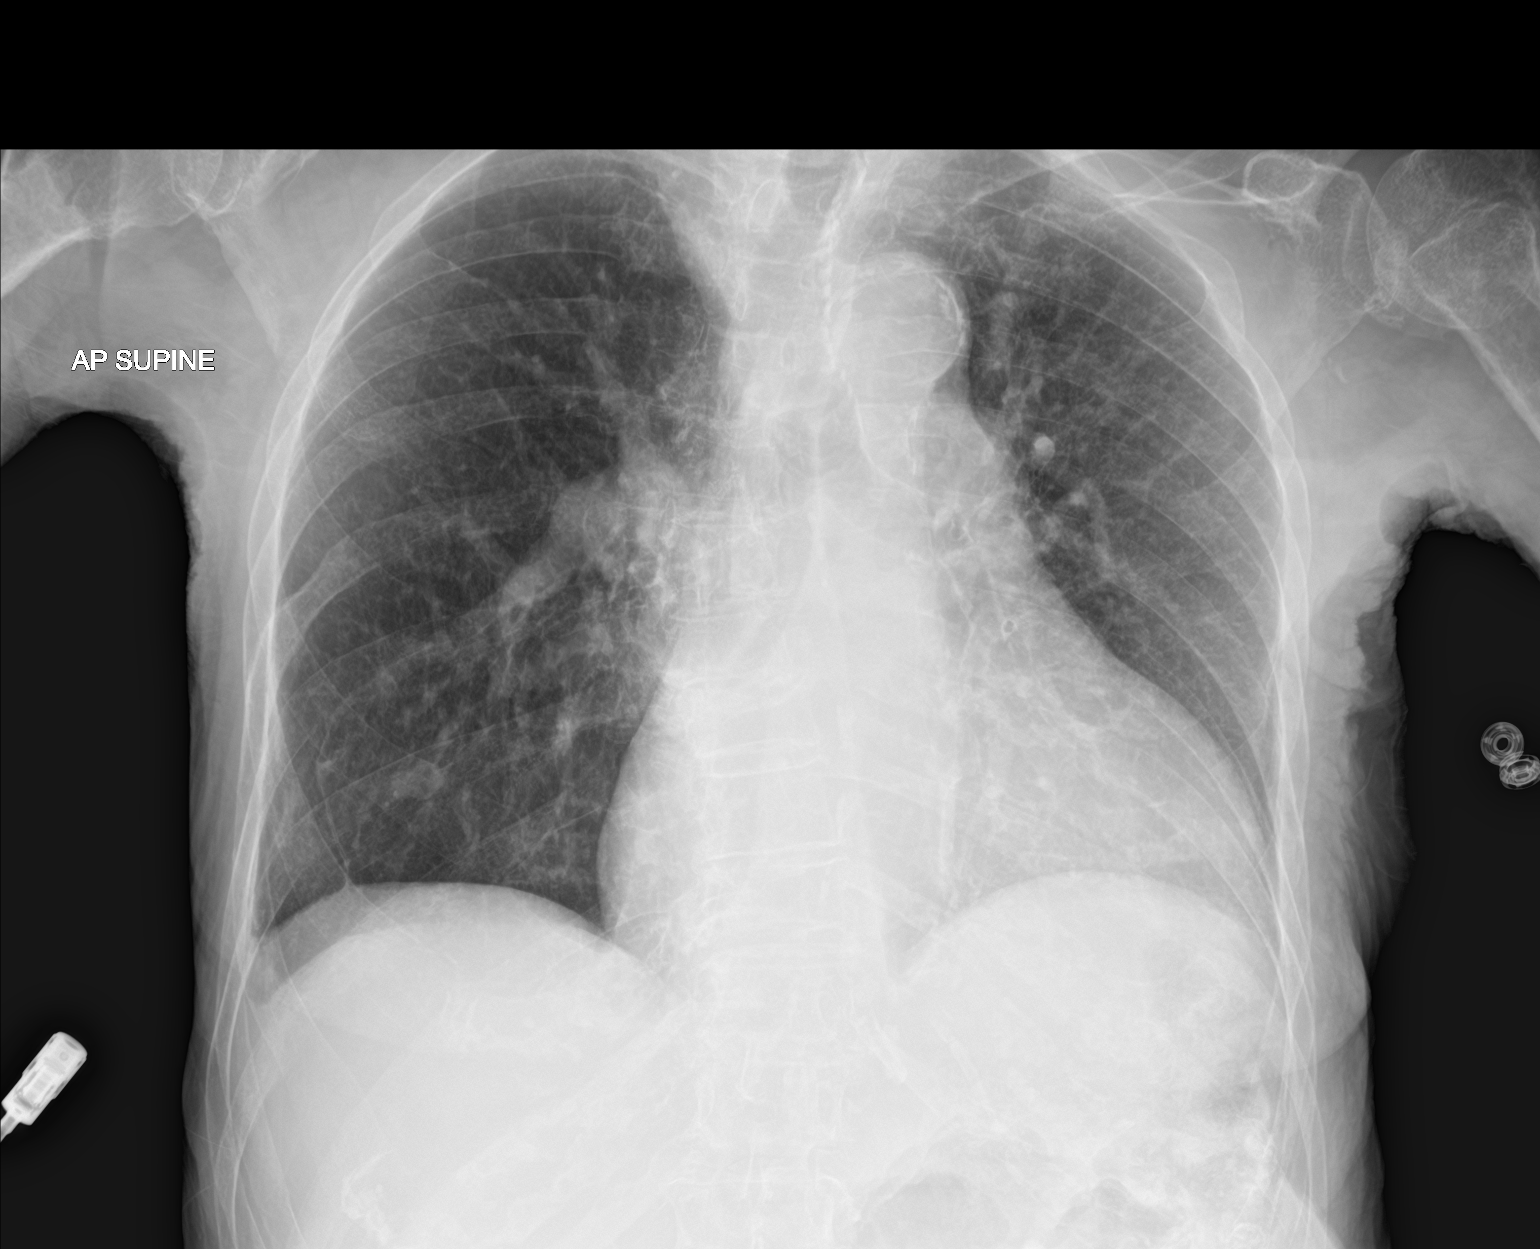

[1 of 1 positions shown; findings below may reference images not displayed]

FINDINGS: Coarse interstitial changes are likely chronic. No acute
consolidation. No convincing evidence of edema. No pneumothorax or
effusion. The aorta is calcified and tortuous. The brachiocephalic
vessels on the right are somewhat tortuous. Remaining
cardiomediastinal contours are unremarkable. Question projectional
irregularity versus fracture of the left humerus. Additional
degenerative changes are present in the spine and shoulders.
IMPRESSION: 1. No active disease.  Likely chronic coarse interstitial changes.
2. Question projectional irregularity versus fracture of the left
humerus. Correlate with point tenderness and consider dedicated
shoulder radiographs if there is clinical concern.

## 2020-08-26 IMAGING — RF OPERATIVE LEFT HIP WITH PELVIS
1 series · 2 of 2 positions shown · non-contrast
Comparison: 10/03/2018

CLINICAL DATA: Left hip replacement

EXAM:
DG C-ARM 61-120 MIN; OPERATIVE LEFT HIP WITH PELVIS

[Series 1: unknown protocol · 0.20mm/px · 2 of 2 slices shown]
[im 1/2]
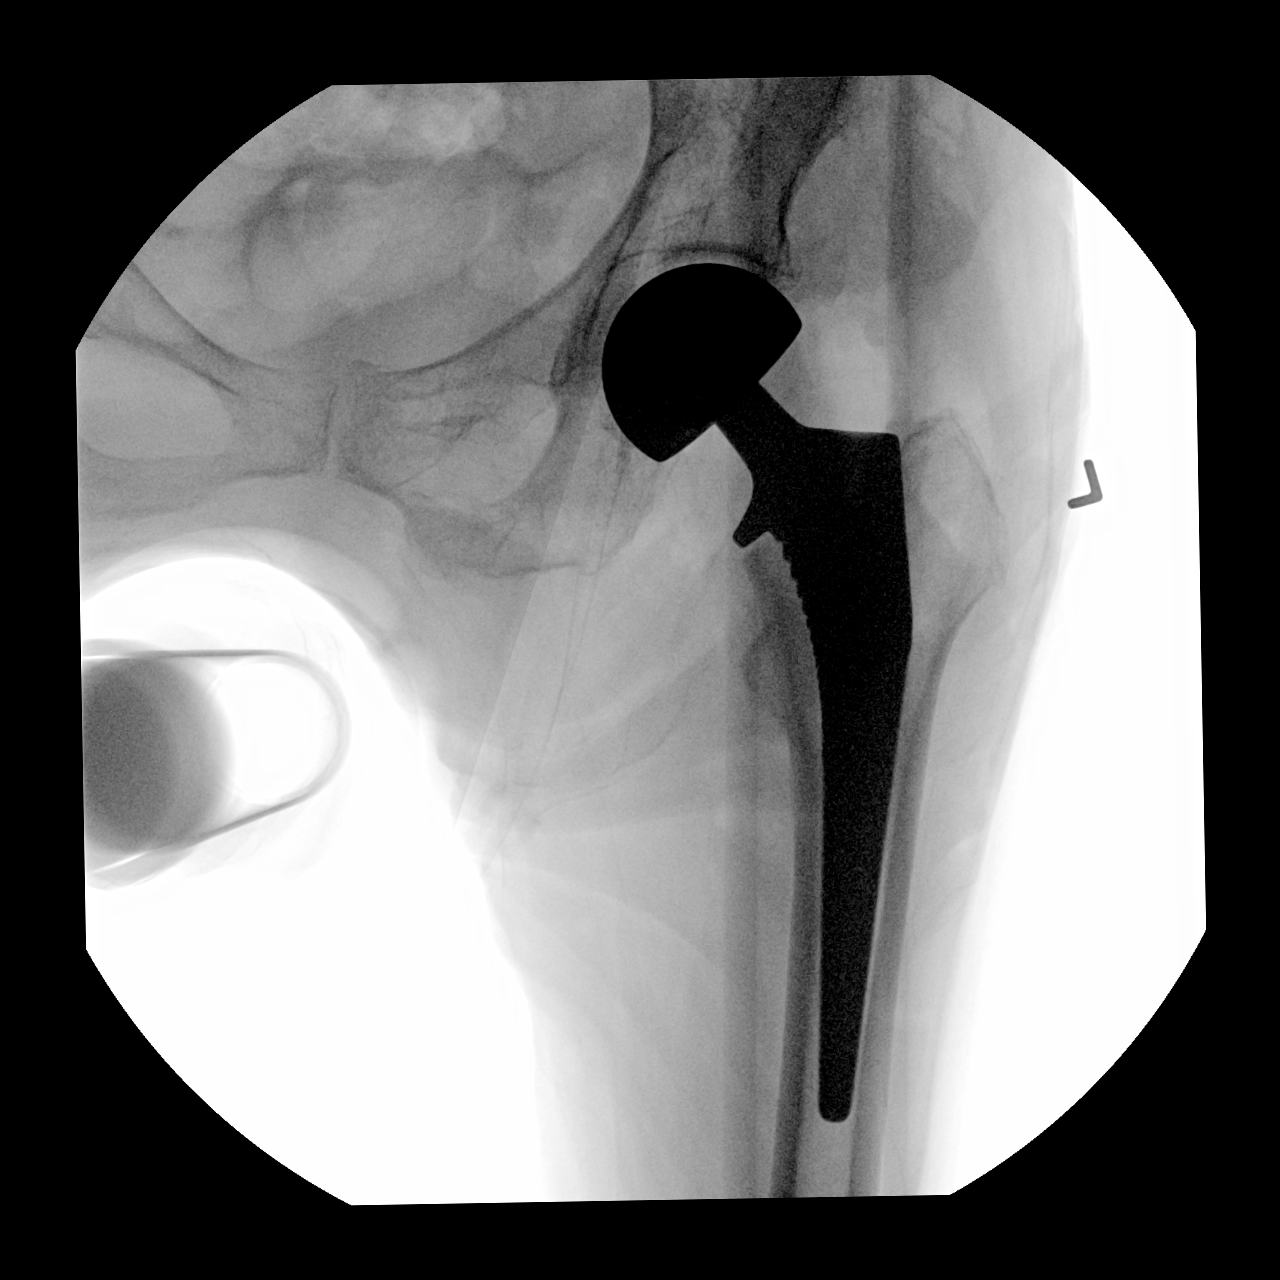
[im 2/2]
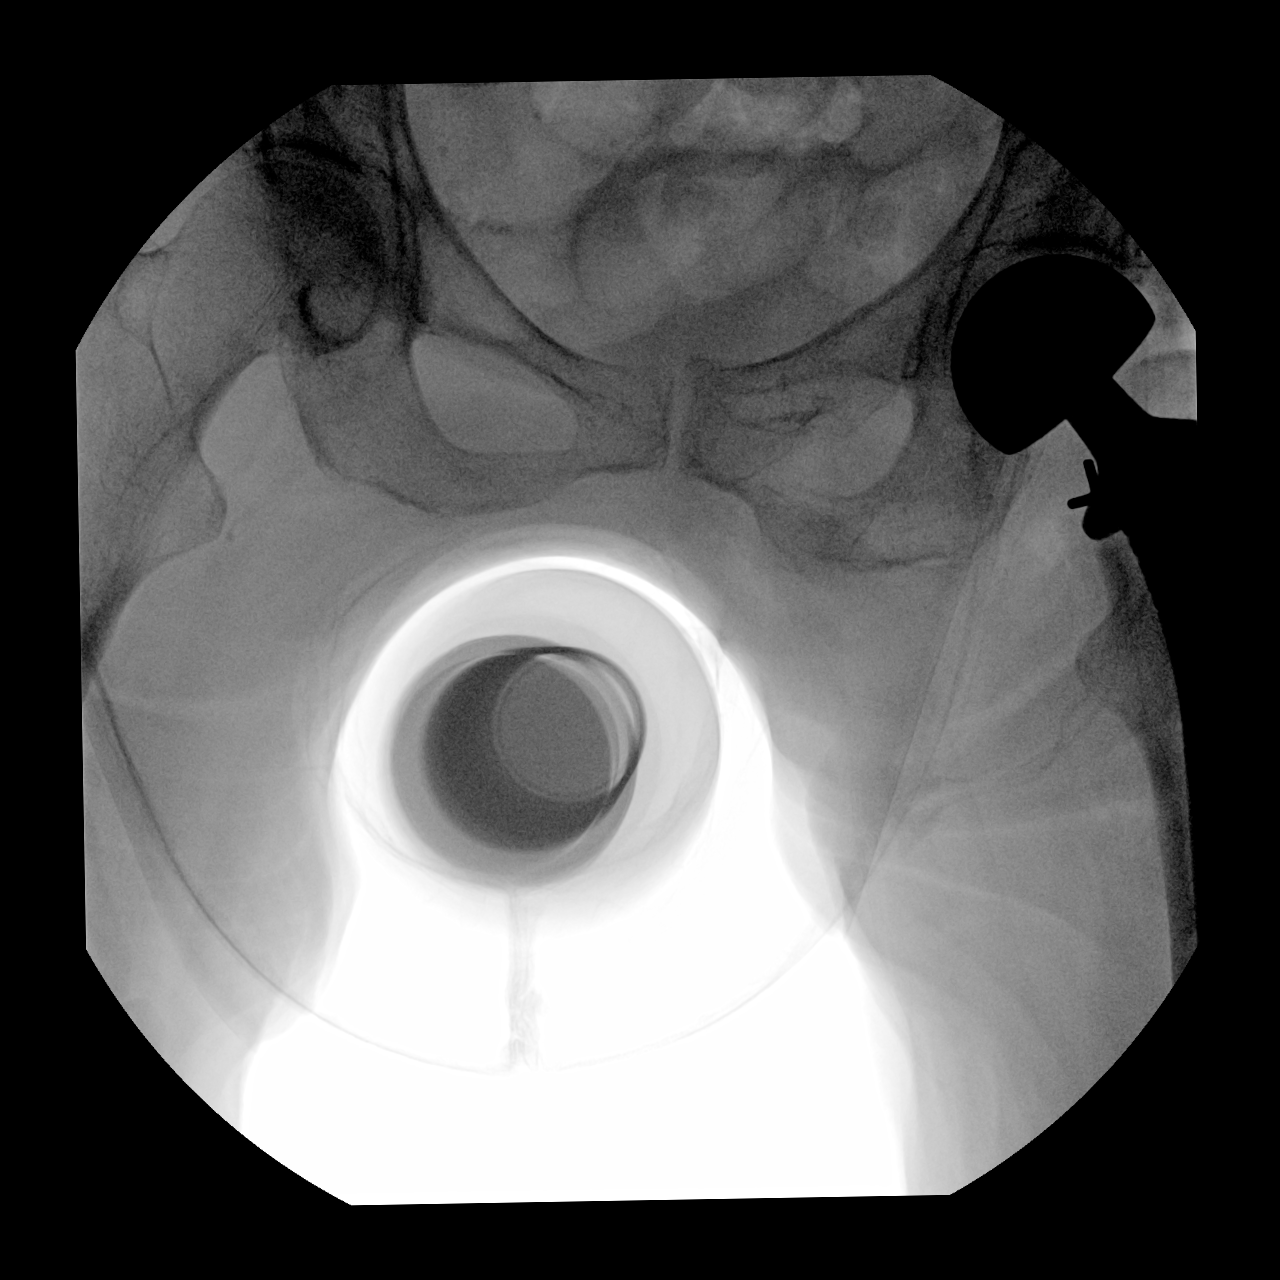

[2 of 2 positions shown; findings below may reference images not displayed]

FINDINGS: Two low resolution intraoperative spot views of the left hip. The
images demonstrate a left hip replacement with normal alignment.
IMPRESSION: Intraoperative fluoroscopic assistance provided during left hip
replacement surgery
